# Patient Record
Sex: Male | Born: 1965
Health system: Southern US, Community
[De-identification: ages and names within clinical notes are randomized; demographics above are authoritative.]

## PROBLEM LIST (undated history)

## (undated) DIAGNOSIS — R634 Abnormal weight loss: Secondary | ICD-10-CM

## (undated) DIAGNOSIS — R7989 Other specified abnormal findings of blood chemistry: Secondary | ICD-10-CM

## (undated) DIAGNOSIS — E7800 Pure hypercholesterolemia, unspecified: Secondary | ICD-10-CM

## (undated) HISTORY — DX: Pure hypercholesterolemia, unspecified: E78.00

## (undated) HISTORY — PX: NO PAST SURGERIES: SHX2092

## (undated) HISTORY — DX: Other specified abnormal findings of blood chemistry: R79.89

---

## 2004-01-17 ENCOUNTER — Emergency Department (HOSPITAL_COMMUNITY): Admission: EM | Admit: 2004-01-17 | Discharge: 2004-01-17 | Payer: Self-pay | Admitting: Emergency Medicine

## 2006-08-13 ENCOUNTER — Ambulatory Visit: Payer: Self-pay | Admitting: Family Medicine

## 2006-08-13 DIAGNOSIS — J309 Allergic rhinitis, unspecified: Secondary | ICD-10-CM | POA: Insufficient documentation

## 2006-08-13 DIAGNOSIS — F17200 Nicotine dependence, unspecified, uncomplicated: Secondary | ICD-10-CM | POA: Insufficient documentation

## 2006-09-13 ENCOUNTER — Encounter (INDEPENDENT_AMBULATORY_CARE_PROVIDER_SITE_OTHER): Payer: Self-pay | Admitting: Family Medicine

## 2009-04-18 ENCOUNTER — Encounter (INDEPENDENT_AMBULATORY_CARE_PROVIDER_SITE_OTHER): Payer: Self-pay | Admitting: *Deleted

## 2009-05-13 ENCOUNTER — Ambulatory Visit: Payer: Self-pay | Admitting: Family Medicine

## 2009-05-13 LAB — CONVERTED CEMR LAB: OCCULT 1: NEGATIVE

## 2010-03-31 ENCOUNTER — Ambulatory Visit: Admit: 2010-03-31 | Payer: Self-pay | Admitting: Family Medicine

## 2010-04-08 NOTE — Assessment & Plan Note (Signed)
Summary: NEW PATIENT- room 3   Vital Signs:  Patient profile:   45 year old male Height:      76 inches Weight:      184.25 pounds BMI:     22.51 O2 Sat:      97 % on Room air Pulse rate:   91 / minute Resp:     16 per minute BP sitting:   120 / 80  (left arm)  Vitals Entered By: Adella Hare LPN (May 13, 1608 3:53 PM) CC: new patient Is Patient Diabetic? No Pain Assessment Patient in pain? no        Primary Provider:  Esperanza Sheets PA  CC:  new patient.  History of Present Illness: New pt here to establish care & requests a physical.    He has no complaints or concerns.  He has seasonal allergies which are controlled with otc zyrtec as needed.  He is a smoker.  Is not interested in quitting.  He is aware of the health concerns related to smoking. He drinks 1 beer daily.  His last tetnus vaccine was over 10 yrs ago.  But pt declines one today.   Current Medications (verified): 1)  None  Allergies (verified): No Known Drug Allergies  Past History:  Past medical, surgical, family and social histories (including risk factors) reviewed, and no changes noted (except as noted below).  Past Medical History: Reviewed history from 08/13/2006 and no changes required. Allergic rhinitis  Past Surgical History: Reviewed history from 08/13/2006 and no changes required. Denies surgical history  Family History: Reviewed history from 08/13/2006 and no changes required. Father: hx unknown Mother: rheumatoid arthritis 60s 1 sister: healthy 30 1 child: healthy 47  Social History: Reviewed history from 08/13/2006 and no changes required. Married Alcohol use-yes Current Smoker Occupation: Merchandiser, retail at [employer]  Review of [employer]:  Denies chills and fever. ENT:  Denies earache, nasal congestion, and sore throat. CV:  Denies chest pain or discomfort and palpitations. Resp:  Denies cough and shortness of breath. GI:  Denies abdominal pain,  bloody stools, change in bowel habits, dark tarry stools, indigestion, nausea, and vomiting. GU:  Denies decreased libido, dysuria, erectile dysfunction, nocturia, and urinary hesitancy. MS:  Denies joint pain, joint swelling, low back pain, mid back pain, and muscle weakness. Derm:  Denies changes in color of skin, lesion(s), and rash. Heme:  Denies enlarge lymph nodes. Allergy:  Complains of seasonal allergies.  Physical Exam  General:  Well-developed,well-nourished,in no acute distress; alert,appropriate and cooperative throughout examination Head:  Normocephalic and atraumatic without obvious abnormalities. No apparent alopecia or balding. Eyes:  pupils equal, pupils round, pupils reactive to light, and no injection.   Ears:  External ear exam shows no significant lesions or deformities.  Otoscopic examination reveals clear canals, tympanic membranes are intact bilaterally without bulging, retraction, inflammation or discharge. Hearing is grossly normal bilaterally. Nose:  External nasal examination shows no deformity or inflammation. Nasal mucosa are pink and moist without lesions or exudates. Mouth:  Oral mucosa and oropharynx without lesions or exudates.   Neck:  No deformities, masses, or tenderness noted.no thyromegaly and no carotid bruits.   Lungs:  Normal respiratory effort, chest expands symmetrically. Lungs are clear to auscultation, no crackles or wheezes. Heart:  Normal rate and regular rhythm. S1 and S2 normal without gallop, murmur, click, rub or other extra sounds. Abdomen:  Bowel sounds positive,abdomen soft and non-tender without masses, organomegaly or hernias noted. Rectal:  No external abnormalities  noted. Normal sphincter tone. No rectal masses or tenderness. Genitalia:  Testes bilaterally descended without nodularity, tenderness or masses. No scrotal masses or lesions. No penis lesions or urethral discharge. Prostate:  Prostate gland firm and smooth, no enlargement,  nodularity, tenderness, mass, asymmetry or induration. Pulses:  R and L carotid,radial,femoral,dorsalis pedis and posterior tibial pulses are full and equal bilaterally Extremities:  No clubbing, cyanosis, edema, or deformity noted with normal full range of motion of all joints.   Neurologic:  alert & oriented X3, cranial nerves II-XII intact, strength normal in all extremities, sensation intact to light touch, gait normal, and DTRs symmetrical and normal.   Skin:  Intact without suspicious lesions or rashes Cervical Nodes:  No lymphadenopathy noted Psych:  Cognition and judgment appear intact. Alert and cooperative with normal attention span and concentration. No apparent delusions, illusions, hallucinations   Impression & Recommendations:  Problem # 1:  WELL ADULT EXAM (ICD-V70.0)  Pt declined tetnus today. Labs ordered.  Orders: T-Comprehensive Metabolic Panel [id number]) T-PSA [id number]) T-Vitamin D (25-Hydroxy) [id number]) Hemoccult Guaiac-1 spec.(in office) ([id number])  Problem # 2:  TOBACCO ABUSE (ICD-305.1) Assessment: Unchanged  Encouraged smoking cessation.  Problem # 3:  ALLERGIC RHINITIS (ICD-477.9) Assessment: Improved  Pt will continue otc Zyrtec prn.  Other Orders: T-Lipid Profile [id number]) T-CBC No Diff ([id number]) T-TSH ([id number])  Patient Instructions: 1)  Recommend a physical in 1 year. 2)  If you decide to get a tetnus vaccine you may call the office or go to the health dept. 3)  Have blood work done fasting. 4)  Tobacco is very bad for your health and your loved ones! You Should stop smoking!. 5)  Stop Smoking Tips: Choose a Quit date. Cut down before the Quit date. decide what you will do as a substitute when you feel the urge to smoke(gum,toothpick,exercise).  Laboratory Results    Stool - Occult Blood Hemmoccult #1: negative Date: 05/13/2009

## 2010-04-08 NOTE — Letter (Signed)
Summary: med records release form  med records release form   Imported By: Donneta Romberg 08/16/2006 08:48:07  _____________________________________________________________________  External Attachment:    Type:   Image     Comment:   External Document

## 2010-04-08 NOTE — Letter (Signed)
Summary: no show letter  no show letter   Imported By: Pablo Lawrence 09/20/2006 16:02:51  _____________________________________________________________________  External Attachment:    Type:   Image     Comment:   External Document

## 2010-04-08 NOTE — Letter (Signed)
Summary: 1st Missed Appt.  [facility]  [address]   [place], [place] [address]   Phone: [phone]  Fax: [phone]    April 18, 2009  MRN: [id number]  Eric Dennis [address] [place], [place]  [address]  Dear [name],  At [facility], we make every attempt to fit patients into our schedule by reserving several appointment slots for same-day appointments.  However, we cannot always make appointments for patients the same day they are calling.  At the end of the day, we look back at our schedule and find that because of last-minute cancellations and patients not showing up for their scheduled appointments, we have several appointment slots that are left open and could have been used by another person who really needed it.  In the past, you may have been one of the patients who could not get in when you needed to.  But recently, you were one of the patients with an appointment that you didn't show up for or canceled too late for [place] to fill it.  We choose not to charge no-show or last minute cancellation fees to our patients, like many other offices do.  We do not wish to institute that policy and hope we never have to.  However, we kindly request that you assist [place] by providing at least 24 hours' notice if you can't make your appointment.  If no-shows or late cancellations become habitual (i.e. Three or more in a one-year period), we may terminate the physician-patient relationship.    Thank you for your consideration and cooperation.   Altamease Oiler
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
## 2011-04-08 ENCOUNTER — Ambulatory Visit: Payer: Self-pay | Admitting: Family Medicine

## 2011-04-09 ENCOUNTER — Encounter: Payer: Self-pay | Admitting: Family Medicine

## 2013-12-20 ENCOUNTER — Ambulatory Visit (HOSPITAL_COMMUNITY)
Admission: RE | Admit: 2013-12-20 | Discharge: 2013-12-20 | Disposition: A | Discharge status: Home / Self Care | Payer: Managed Care, Other (non HMO) | Source: Ambulatory Visit | Attending: Family Medicine | Admitting: Family Medicine

## 2013-12-20 ENCOUNTER — Other Ambulatory Visit (HOSPITAL_COMMUNITY): Payer: Self-pay | Admitting: Family Medicine

## 2013-12-20 DIAGNOSIS — R634 Abnormal weight loss: Secondary | ICD-10-CM

## 2013-12-20 DIAGNOSIS — Z0000 Encounter for general adult medical examination without abnormal findings: Secondary | ICD-10-CM | POA: Diagnosis not present

## 2013-12-20 DIAGNOSIS — F17200 Nicotine dependence, unspecified, uncomplicated: Secondary | ICD-10-CM

## 2013-12-20 DIAGNOSIS — Z720 Tobacco use: Secondary | ICD-10-CM | POA: Insufficient documentation

## 2014-01-10 ENCOUNTER — Encounter (INDEPENDENT_AMBULATORY_CARE_PROVIDER_SITE_OTHER): Payer: Self-pay | Admitting: *Deleted

## 2014-01-25 ENCOUNTER — Other Ambulatory Visit (INDEPENDENT_AMBULATORY_CARE_PROVIDER_SITE_OTHER): Payer: Self-pay | Admitting: Internal Medicine

## 2014-01-25 ENCOUNTER — Encounter (INDEPENDENT_AMBULATORY_CARE_PROVIDER_SITE_OTHER): Payer: Self-pay | Admitting: Internal Medicine

## 2014-01-25 ENCOUNTER — Telehealth (INDEPENDENT_AMBULATORY_CARE_PROVIDER_SITE_OTHER): Payer: Self-pay | Admitting: *Deleted

## 2014-01-25 ENCOUNTER — Ambulatory Visit (INDEPENDENT_AMBULATORY_CARE_PROVIDER_SITE_OTHER): Payer: Managed Care, Other (non HMO) | Admitting: Internal Medicine

## 2014-01-25 ENCOUNTER — Other Ambulatory Visit (INDEPENDENT_AMBULATORY_CARE_PROVIDER_SITE_OTHER): Payer: Self-pay | Admitting: *Deleted

## 2014-01-25 VITALS — BP 94/64 | HR 64 | Temp 97.4°F | Ht 77.0 in | Wt 176.2 lb

## 2014-01-25 DIAGNOSIS — R748 Abnormal levels of other serum enzymes: Secondary | ICD-10-CM

## 2014-01-25 DIAGNOSIS — R772 Abnormality of alphafetoprotein: Secondary | ICD-10-CM

## 2014-01-25 DIAGNOSIS — R634 Abnormal weight loss: Secondary | ICD-10-CM

## 2014-01-25 NOTE — Telephone Encounter (Signed)
Patient needs movi prep 

## 2014-01-25 NOTE — Patient Instructions (Addendum)
Colonoscopy. The risks and benefits such as perforation, bleeding, and infection were reviewed with the patient and is agreeable.  AFP and Hepatic function.

## 2014-01-25 NOTE — Progress Notes (Addendum)
   Subjective:    Patient ID: Eric Dennis, male    DOB: 1965-07-14, 48 y.o.   MRN: [id number]  HPI Referred to our office by D.r Eloise Harman for colonoscopy/weight loss. He has lost about 10 over the past years which was unintentional. He says he has lost his appetite. He works around [employer]. He denies any pain.  He usually has a BM daily. He has never done IV drugs. He has 3 tattoos.  All tattoos professionally done. He tells his Hepatitis panel was negative. I will get those records.  12/21/2013 H and H 15.5 and 43.6, MCV 98.2, Platelet ct 204. ALP 117 AST 169, ALT 123, bili 0.3 Hep B Surface Antigen negative, Hep C antibody negative, Hepatitis B Core Ab, IgM non-reactive Hep A antibody,IgM nonreactive  Review of Systems Divorced. One child in good health. Works at [employer].    No past medical history on file.  No past surgical history on file.  No Known Allergies  No current outpatient prescriptions on file prior to visit.   No current facility-administered medications on file prior to visit.     Objective:   Physical Exam Filed Vitals:   01/25/14 1549  Height: 6\' 5"  (1.956 m)  Weight: 176 lb 3.2 oz (79.924 kg)   Alert and oriented. Skin warm and dry. Oral mucosa is moist.   . Sclera anicteric, conjunctivae is pink. Thyroid not enlarged. No cervical lymphadenopathy. Lungs clear. Heart regular rate and rhythm.  Abdomen is soft. Bowel sounds are positive. No hepatomegaly. No abdominal masses felt. No tenderness.  No edema to lower extremities.       Assessment & Plan:  Weight loss. Patient due for screening colonoscopy. Will rule out colonic neoplasm, polyp, AVM, Elevated liver enzymes.  AFP, Hepatic panel. Further recommendations to follow concerning his elevated liver enzymes.

## 2014-01-26 ENCOUNTER — Telehealth (INDEPENDENT_AMBULATORY_CARE_PROVIDER_SITE_OTHER): Payer: Self-pay | Admitting: Internal Medicine

## 2014-01-26 DIAGNOSIS — R772 Abnormality of alphafetoprotein: Secondary | ICD-10-CM

## 2014-01-26 DIAGNOSIS — Z1211 Encounter for screening for malignant neoplasm of colon: Secondary | ICD-10-CM

## 2014-01-26 DIAGNOSIS — R748 Abnormal levels of other serum enzymes: Secondary | ICD-10-CM

## 2014-01-26 LAB — HEPATIC FUNCTION PANEL
ALT: 97 U/L — ABNORMAL HIGH (ref 0–53)
AST: 133 U/L — ABNORMAL HIGH (ref 0–37)
Albumin: 3.7 g/dL (ref 3.5–5.2)
Alkaline Phosphatase: 94 U/L (ref 39–117)
Bilirubin, Direct: 0.1 mg/dL (ref 0.0–0.3)
Indirect Bilirubin: 0.5 mg/dL (ref 0.2–1.2)
Total Bilirubin: 0.6 mg/dL (ref 0.2–1.2)
Total Protein: 6.6 g/dL (ref 6.0–8.3)

## 2014-01-26 LAB — AFP TUMOR MARKER: AFP-Tumor Marker: 8 ng/mL — ABNORMAL HIGH (ref <6.1)

## 2014-01-26 MED ORDER — PEG-KCL-NACL-NASULF-NA ASC-C 100 G PO SOLR: 1.0000 | Freq: Once | ORAL | Status: DC

## 2014-01-26 NOTE — Addendum Note (Signed)
Addended by: Butch Penny on: 01/26/2014 09:48 AM   Modules accepted: Orders

## 2014-01-26 NOTE — Telephone Encounter (Signed)
Results given to patient. Will get a CT  Abdomen/pelivs with CM.

## 2014-01-26 NOTE — Telephone Encounter (Signed)
Ct ordered

## 2014-01-26 NOTE — Telephone Encounter (Signed)
Moviprep ordered.

## 2014-01-26 NOTE — Telephone Encounter (Signed)
error 

## 2014-01-29 NOTE — Telephone Encounter (Signed)
Authorization in process

## 2014-01-31 NOTE — Telephone Encounter (Signed)
Auth# [id number] @ [id number] , patient aware

## 2014-02-06 ENCOUNTER — Ambulatory Visit (HOSPITAL_COMMUNITY)
Admission: RE | Admit: 2014-02-06 | Discharge: 2014-02-06 | Disposition: A | Discharge status: Home / Self Care | Payer: Managed Care, Other (non HMO) | Source: Ambulatory Visit | Attending: Internal Medicine | Admitting: Internal Medicine

## 2014-02-06 DIAGNOSIS — R772 Abnormality of alphafetoprotein: Secondary | ICD-10-CM

## 2014-02-06 DIAGNOSIS — R945 Abnormal results of liver function studies: Secondary | ICD-10-CM | POA: Diagnosis present

## 2014-02-06 DIAGNOSIS — K429 Umbilical hernia without obstruction or gangrene: Secondary | ICD-10-CM | POA: Insufficient documentation

## 2014-02-06 DIAGNOSIS — K760 Fatty (change of) liver, not elsewhere classified: Secondary | ICD-10-CM | POA: Insufficient documentation

## 2014-02-06 DIAGNOSIS — I700 Atherosclerosis of aorta: Secondary | ICD-10-CM | POA: Insufficient documentation

## 2014-02-06 DIAGNOSIS — R748 Abnormal levels of other serum enzymes: Secondary | ICD-10-CM

## 2014-02-06 MED ORDER — IOHEXOL 300 MG/ML  SOLN
100.0000 mL | Freq: Once | INTRAMUSCULAR | Status: AC | PRN
  Administered 2014-02-06: 100 mL via INTRAVENOUS

## 2014-02-08 ENCOUNTER — Telehealth (INDEPENDENT_AMBULATORY_CARE_PROVIDER_SITE_OTHER): Payer: Self-pay | Admitting: Internal Medicine

## 2014-02-08 ENCOUNTER — Telehealth (INDEPENDENT_AMBULATORY_CARE_PROVIDER_SITE_OTHER): Payer: Self-pay | Admitting: *Deleted

## 2014-02-08 DIAGNOSIS — R748 Abnormal levels of other serum enzymes: Secondary | ICD-10-CM

## 2014-02-08 NOTE — Telephone Encounter (Signed)
labs

## 2014-02-08 NOTE — Telephone Encounter (Signed)
.  Per Terri Setzer,NP patient to have labs. 

## 2014-02-08 NOTE — Telephone Encounter (Signed)
Labs

## 2014-02-12 ENCOUNTER — Telehealth (INDEPENDENT_AMBULATORY_CARE_PROVIDER_SITE_OTHER): Payer: Self-pay | Admitting: *Deleted

## 2014-02-12 ENCOUNTER — Encounter (INDEPENDENT_AMBULATORY_CARE_PROVIDER_SITE_OTHER): Payer: Self-pay | Admitting: *Deleted

## 2014-02-12 NOTE — Telephone Encounter (Signed)
FYI: Patient rescheduled TCS to 03/14/14

## 2014-02-22 ENCOUNTER — Encounter (INDEPENDENT_AMBULATORY_CARE_PROVIDER_SITE_OTHER): Payer: Self-pay

## 2014-03-13 ENCOUNTER — Telehealth (INDEPENDENT_AMBULATORY_CARE_PROVIDER_SITE_OTHER): Payer: Self-pay | Admitting: *Deleted

## 2014-03-13 DIAGNOSIS — Z1211 Encounter for screening for malignant neoplasm of colon: Secondary | ICD-10-CM

## 2014-03-13 MED ORDER — PEG-KCL-NACL-NASULF-NA ASC-C 100 G PO SOLR: 1.0000 | Freq: Once | ORAL | Status: DC

## 2014-03-13 NOTE — Telephone Encounter (Signed)
Patient states pharmacy didn't get, please resend

## 2014-03-14 ENCOUNTER — Ambulatory Visit (HOSPITAL_COMMUNITY)
Admission: RE | Admit: 2014-03-14 | Discharge: 2014-03-14 | Disposition: A | Discharge status: Home / Self Care | Payer: Managed Care, Other (non HMO) | Source: Ambulatory Visit | Attending: Internal Medicine | Admitting: Internal Medicine

## 2014-03-14 ENCOUNTER — Encounter (HOSPITAL_COMMUNITY)
Admission: RE | Disposition: A | Discharge status: Home / Self Care | Payer: Self-pay | Source: Ambulatory Visit | Attending: Internal Medicine

## 2014-03-14 ENCOUNTER — Encounter (HOSPITAL_COMMUNITY): Payer: Self-pay | Admitting: *Deleted

## 2014-03-14 DIAGNOSIS — K644 Residual hemorrhoidal skin tags: Secondary | ICD-10-CM | POA: Diagnosis not present

## 2014-03-14 DIAGNOSIS — F17210 Nicotine dependence, cigarettes, uncomplicated: Secondary | ICD-10-CM | POA: Diagnosis not present

## 2014-03-14 DIAGNOSIS — R634 Abnormal weight loss: Secondary | ICD-10-CM

## 2014-03-14 DIAGNOSIS — K649 Unspecified hemorrhoids: Secondary | ICD-10-CM

## 2014-03-14 DIAGNOSIS — K648 Other hemorrhoids: Secondary | ICD-10-CM | POA: Diagnosis not present

## 2014-03-14 DIAGNOSIS — Z1211 Encounter for screening for malignant neoplasm of colon: Secondary | ICD-10-CM

## 2014-03-14 HISTORY — PX: COLONOSCOPY: SHX5424

## 2014-03-14 HISTORY — DX: Abnormal weight loss: R63.4

## 2014-03-14 LAB — HEPATIC FUNCTION PANEL
ALT: 128 U/L — ABNORMAL HIGH (ref 0–53)
AST: 163 U/L — ABNORMAL HIGH (ref 0–37)
Albumin: 3.5 g/dL (ref 3.5–5.2)
Alkaline Phosphatase: 108 U/L (ref 39–117)
Bilirubin, Direct: 0.2 mg/dL (ref 0.0–0.3)
Indirect Bilirubin: 0.9 mg/dL (ref 0.3–0.9)
Total Bilirubin: 1.1 mg/dL (ref 0.3–1.2)
Total Protein: 6.8 g/dL (ref 6.0–8.3)

## 2014-03-14 LAB — FERRITIN: Ferritin: 975 ng/mL — ABNORMAL HIGH (ref 22–322)

## 2014-03-14 SURGERY — COLONOSCOPY
Anesthesia: Moderate Sedation

## 2014-03-14 MED ORDER — MIDAZOLAM HCL 5 MG/5ML IJ SOLN
INTRAMUSCULAR | Status: AC
  Filled 2014-03-14: qty 10

## 2014-03-14 MED ORDER — MEPERIDINE HCL 50 MG/ML IJ SOLN
INTRAMUSCULAR | Status: DC | PRN
  Administered 2014-03-14 (×2): 25 mg via INTRAVENOUS

## 2014-03-14 MED ORDER — MIDAZOLAM HCL 5 MG/5ML IJ SOLN
INTRAMUSCULAR | Status: DC | PRN
  Administered 2014-03-14 (×3): 2 mg via INTRAVENOUS

## 2014-03-14 MED ORDER — SODIUM CHLORIDE 0.9 % IV SOLN
INTRAVENOUS | Status: DC
  Administered 2014-03-14: 11:00:00 via INTRAVENOUS

## 2014-03-14 MED ORDER — STERILE WATER FOR IRRIGATION IR SOLN
Status: DC | PRN
  Administered 2014-03-14: 11:00:00

## 2014-03-14 MED ORDER — MEPERIDINE HCL 50 MG/ML IJ SOLN
INTRAMUSCULAR | Status: AC
  Filled 2014-03-14: qty 1

## 2014-03-14 NOTE — H&P (Addendum)
Eric Dennis is an 49 y.o. male.   Chief Complaint:  Patient is here for colonoscopy. HPI:  Patient is 49 year old African-American male was here for screening colonoscopy. He was recently evaluated for 10 pound weight loss and noted to have mildly elevated transaminases. Markers for hepatitis A, B, and C are negative.  Abdominopelvic CT suggested fatty liver but no other abnormalities. He denies nausea vomiting abdominal pain melena or rectal bleeding or diarrhea. He states his appetite is so-so. He does not take any medications on regular basis. He drinks 2 cans of beer daily. He smokes vital pack of cigarettes daily.  Family history is negative for liver disease or CRC.  Past Medical History  Diagnosis Date  . Weight loss     Past Surgical History  Procedure Laterality Date  . No past surgeries      History reviewed. No pertinent family history. Social History:  reports that he has been smoking Cigarettes.  He has a 5 pack-year smoking history. He does not have any smokeless tobacco history on file. He reports that he drinks about 8.4 oz of alcohol per week. He reports that he does not use illicit drugs.  Allergies: No Known Allergies  Medications Prior to Admission  Medication Sig Dispense Refill  . peg 3350 powder (MOVIPREP) 100 G SOLR Take 1 kit (200 g total) by mouth once. 1 kit 0    No results found for this or any previous visit (from the past 48 hour(s)). No results found.  ROS  Blood pressure 145/107, pulse 88, temperature 97.8 F (36.6 C), temperature source Oral, resp. rate 20, height '6\' 5"'  (1.956 m), weight 176 lb (79.833 kg), SpO2 100 %. Physical Exam  Constitutional: He appears well-developed and well-nourished.  HENT:  Mouth/Throat: Oropharynx is clear and moist.  Eyes: Conjunctivae are normal. No scleral icterus.  Neck: No thyromegaly present.  Cardiovascular: Normal rate, regular rhythm and normal heart sounds.   No murmur heard. Respiratory: Effort  normal and breath sounds normal.  GI: Soft. He exhibits no distension and no mass. There is no tenderness.  Musculoskeletal: He exhibits no edema.  Lymphadenopathy:    He has no cervical adenopathy.  Neurological: He is alert.  Skin: Skin is warm and dry.     Assessment/Plan  Average risk screening colonoscopy.  Elevated transaminases; being worked up.  Unexplained weight loss.  REHMAN,NAJEEB U 03/14/2014, 10:56 AM

## 2014-03-14 NOTE — Op Note (Signed)
COLONOSCOPY PROCEDURE REPORT  PATIENT:  Eric Dennis  MR#:  [id number] Birthdate:  10-14-1965, 49 y.o., male Endoscopist:  Dr. Rogene Houston, MD Referred By:  Dr.  Rocky Morel, MD Procedure Date: 03/14/2014  Procedure:   Colonoscopy  Indications:   Patient is 49 year old African-American male who is undergoing colonoscopy primarily for screening purposes. He was recently evaluated for weight loss and noted to have elevated transaminases and workup is underway.  Informed Consent:  The procedure and risks were reviewed with the patient and informed consent was obtained.  Medications:  Demerol 50 mg IV Versed 6 mg IV  Description of procedure:  After a digital rectal exam was performed, that colonoscope was advanced from the anus through the rectum and colon to the area of the cecum, ileocecal valve and appendiceal orifice. The cecum was deeply intubated. These structures were well-seen and photographed for the record. From the level of the cecum and ileocecal valve, the scope was slowly and cautiously withdrawn. The mucosal surfaces were carefully surveyed utilizing scope tip to flexion to facilitate fold flattening as needed. The scope was pulled down into the rectum where a thorough exam including retroflexion was performed.  Findings:   Prep satisfactory.  normal mucosa of cecum, ascending colon , hepatic flexure, transverse colon, splenic flexure , descending and sigmoid colon.  Normal rectal mucosa.  Hemorrhoids noted above and below the dentate line.   Therapeutic/Diagnostic Maneuvers Performed:  None  Complications:   none  Cecal Withdrawal Time:   8 minutes  Impression:   Examination performed to cecum.  Internal and external hemorrhoids otherwise normal colonoscopy.   Recommendations:  Standard instructions given.  Patient advised to quit drinking alcohol.  Will check LFTs and serum ferritin today.  [name]  03/14/2014 11:36 AM  CC: Dr.  Ethlyn Gallery, Kassie Mends, MD & Dr. Rayne Du ref. provider found

## 2014-03-14 NOTE — Discharge Instructions (Signed)
Resume usual diet.  You need to stop drinking alcohol while we are trying to figure out cause of elevated liver enzymes.  No driving for 24 hours.  Physician will call with results of blood tests.  Colonoscopy, Care After Refer to this sheet in the next few weeks. These instructions provide you with information on caring for yourself after your procedure. Your health care provider may also give you more specific instructions. Your treatment has been planned according to current medical practices, but problems sometimes occur. Call your health care provider if you have any problems or questions after your procedure. WHAT TO EXPECT AFTER THE PROCEDURE  After your procedure, it is typical to have the following:  A small amount of blood in your stool.  Moderate amounts of gas and mild abdominal cramping or bloating. HOME CARE INSTRUCTIONS  Do not drive, operate machinery, or sign important documents for 24 hours.  You may shower and resume your regular physical activities, but move at a slower pace for the first 24 hours.  Take frequent rest periods for the first 24 hours.  Walk around or put a warm pack on your abdomen to help reduce abdominal cramping and bloating.  Drink enough fluids to keep your urine clear or pale yellow.  You may resume your normal diet as instructed by your health care provider. Avoid heavy or fried foods that are hard to digest.  Avoid drinking alcohol for 24 hours or as instructed by your health care provider.  Only take over-the-counter or prescription medicines as directed by your health care provider.  If a tissue sample (biopsy) was taken during your procedure:  Do not take aspirin or blood thinners for 7 days, or as instructed by your health care provider.  Do not drink alcohol for 7 days, or as instructed by your health care provider.  Eat soft foods for the first 24 hours. SEEK MEDICAL CARE IF: You have persistent spotting of blood in your stool  2-3 days after the procedure. SEEK IMMEDIATE MEDICAL CARE IF:  You have more than a small spotting of blood in your stool.  You pass large blood clots in your stool.  Your abdomen is swollen (distended).  You have nausea or vomiting.  You have a fever.  You have increasing abdominal pain that is not relieved with medicine. Document Released: 10/08/2003 Document Revised: 12/14/2012 Document Reviewed: 10/31/2012 [facility] Patient Information 2015 [place], [place]. This information is not intended to replace advice given to you by your health care provider. Make sure you discuss any questions you have with your health care provider.

## 2014-03-15 ENCOUNTER — Encounter (HOSPITAL_COMMUNITY): Payer: Self-pay | Admitting: Internal Medicine

## 2014-03-21 ENCOUNTER — Encounter (INDEPENDENT_AMBULATORY_CARE_PROVIDER_SITE_OTHER): Payer: Self-pay | Admitting: *Deleted

## 2014-03-21 ENCOUNTER — Telehealth (INDEPENDENT_AMBULATORY_CARE_PROVIDER_SITE_OTHER): Payer: Self-pay | Admitting: *Deleted

## 2014-03-21 DIAGNOSIS — R634 Abnormal weight loss: Secondary | ICD-10-CM

## 2014-03-21 NOTE — Telephone Encounter (Signed)
Per Dr.Rehman the patient will need to have labs drawn on 03/30/14.

## 2014-05-08 ENCOUNTER — Telehealth (INDEPENDENT_AMBULATORY_CARE_PROVIDER_SITE_OTHER): Payer: Self-pay | Admitting: Internal Medicine

## 2014-05-08 NOTE — Telephone Encounter (Signed)
error 

## 2014-05-11 NOTE — Telephone Encounter (Signed)
error 

## 2015-02-27 ENCOUNTER — Emergency Department (HOSPITAL_COMMUNITY)
Admission: EM | Admit: 2015-02-27 | Discharge: 2015-02-27 | Disposition: A | Discharge status: Home / Self Care | Payer: Managed Care, Other (non HMO) | Attending: Emergency Medicine | Admitting: Emergency Medicine

## 2015-02-27 ENCOUNTER — Encounter (HOSPITAL_COMMUNITY): Payer: Self-pay | Admitting: Emergency Medicine

## 2015-02-27 DIAGNOSIS — L02212 Cutaneous abscess of back [any part, except buttock]: Secondary | ICD-10-CM | POA: Insufficient documentation

## 2015-02-27 DIAGNOSIS — L723 Sebaceous cyst: Secondary | ICD-10-CM | POA: Insufficient documentation

## 2015-02-27 DIAGNOSIS — L089 Local infection of the skin and subcutaneous tissue, unspecified: Secondary | ICD-10-CM

## 2015-02-27 DIAGNOSIS — F17210 Nicotine dependence, cigarettes, uncomplicated: Secondary | ICD-10-CM | POA: Insufficient documentation

## 2015-02-27 MED ORDER — POVIDONE-IODINE 10 % EX SOLN
CUTANEOUS | Status: AC
  Filled 2015-02-27: qty 118

## 2015-02-27 MED ORDER — LIDOCAINE HCL (PF) 1 % IJ SOLN
5.0000 mL | Freq: Once | INTRAMUSCULAR | Status: AC
  Administered 2015-02-27: 5 mL
  Filled 2015-02-27: qty 5

## 2015-02-27 NOTE — Discharge Instructions (Signed)

## 2015-02-27 NOTE — ED Provider Notes (Signed)
CSN: [id number]     Arrival date & time 02/27/15  0040 History   First MD Initiated Contact with Patient 02/27/15 0114     Chief Complaint  Patient presents with  . Abscess     (Consider location/radiation/quality/duration/timing/severity/associated sxs/prior Treatment) Patient is a 49 y.o. male presenting with abscess. The history is provided by the patient.  Abscess He has noted an abscess between his shoulder blades over the last 3 weeks. It has been growing. He has tried applying topical cold these without relief. He states that it is sore but not actually painful. He denies fever, chills, sweats.  Past Medical History  Diagnosis Date  . Weight loss    Past Surgical History  Procedure Laterality Date  . No past surgeries    . Colonoscopy N/A 03/14/2014    Procedure: COLONOSCOPY;  Surgeon: Rogene Houston, MD;  Location: [facility];  Service: Endoscopy;  Laterality: N/A;  730 - moved to 1/6 @ 12:00 - Ann notified pt   History reviewed. No pertinent family history. Social History  Substance Use Topics  . Smoking status: Current Every Day Smoker -- 0.25 packs/day for 20 years    Types: Cigarettes  . Smokeless tobacco: None     Comment: 1 pack every 4 days x 20 yrs  . Alcohol Use: 8.4 oz/week    0 Standard drinks or equivalent, 14 Cans of beer per week     Comment: 1-2 beers a day.    Review of Systems  All other systems reviewed and are negative.     Allergies  Review of patient's allergies indicates no known allergies.  Home Medications   Prior to Admission medications   Not on File   BP 134/85 mmHg  Pulse 82  Temp(Src) 97.5 F (36.4 C) (Oral)  Resp 18  Ht 6\' 5"  (1.956 m)  Wt 185 lb (83.915 kg)  BMI 21.93 kg/m2  SpO2 96% Physical Exam  Nursing note and vitals reviewed.  49 year old male, resting comfortably and in no acute distress. Vital signs are normal. Oxygen saturation is 96%, which is normal. Head is normocephalic and atraumatic. PERRLA, EOMI.  Oropharynx is clear. Neck is nontender and supple without adenopathy or JVD. 3.5 x 3.5 cm abscess present in the interscapular area. Back is nontender and there is no CVA tenderness. Lungs are clear without rales, wheezes, or rhonchi. Chest is nontender. Heart has regular rate and rhythm without murmur. Abdomen is soft, flat, nontender without masses or hepatosplenomegaly and peristalsis is normoactive. Extremities have no cyanosis or edema, full range of motion is present. Skin is warm and dry without rash. Neurologic: Mental status is normal, cranial nerves are intact, there are no motor or sensory deficits.  ED Course  Procedures (including critical care time) INCISION AND DRAINAGE Performed by: [id number] Consent: Verbal consent obtained. Risks and benefits: risks, benefits and alternatives were discussed Type: abscess  Body area: upper back  Anesthesia: local infiltration  Incision was made with a scalpel.  Local anesthetic: lidocaine 1% without epinephrine  Anesthetic total: 2 ml  Complexity: complex Blunt dissection to break up loculations  Drainage: purulent, sebaceous material  Drainage amount: large  Patient tolerance: Patient tolerated the procedure well with no immediate complications.     MDM   Final diagnoses:  Abscess of back  Infected sebaceous cyst    Abscess treated with incision and drainage. Old records reviewed and he had colonoscopy in January was noted to have elevated liver functions. He states  she has not followed up regarding those tests and he continues to consume alcohol. He is advised to abstain from alcohol and follow-up with his PCP regarding his abnormal liver tests.    Delora Fuel, MD [id number] [phone]

## 2015-02-27 NOTE — ED Notes (Signed)
Pt c/o abscess in between shoulder blades for about 3 weeks.

## 2018-04-14 DIAGNOSIS — Z008 Encounter for other general examination: Secondary | ICD-10-CM | POA: Diagnosis not present

## 2018-04-14 DIAGNOSIS — F17210 Nicotine dependence, cigarettes, uncomplicated: Secondary | ICD-10-CM | POA: Diagnosis not present

## 2018-04-14 DIAGNOSIS — Z716 Tobacco abuse counseling: Secondary | ICD-10-CM | POA: Diagnosis not present

## 2018-04-14 DIAGNOSIS — Z719 Counseling, unspecified: Secondary | ICD-10-CM | POA: Diagnosis not present

## 2018-04-28 DIAGNOSIS — Z716 Tobacco abuse counseling: Secondary | ICD-10-CM | POA: Diagnosis not present

## 2018-04-28 DIAGNOSIS — F17210 Nicotine dependence, cigarettes, uncomplicated: Secondary | ICD-10-CM | POA: Diagnosis not present

## 2018-05-12 DIAGNOSIS — Z716 Tobacco abuse counseling: Secondary | ICD-10-CM | POA: Diagnosis not present

## 2018-05-12 DIAGNOSIS — F17210 Nicotine dependence, cigarettes, uncomplicated: Secondary | ICD-10-CM | POA: Diagnosis not present

## 2018-07-27 DIAGNOSIS — R634 Abnormal weight loss: Secondary | ICD-10-CM | POA: Diagnosis not present

## 2018-07-27 DIAGNOSIS — F17210 Nicotine dependence, cigarettes, uncomplicated: Secondary | ICD-10-CM | POA: Diagnosis not present

## 2018-07-27 DIAGNOSIS — R590 Localized enlarged lymph nodes: Secondary | ICD-10-CM | POA: Diagnosis not present

## 2018-07-27 DIAGNOSIS — R630 Anorexia: Secondary | ICD-10-CM | POA: Diagnosis not present

## 2018-09-29 DIAGNOSIS — Z716 Tobacco abuse counseling: Secondary | ICD-10-CM | POA: Diagnosis not present

## 2018-09-29 DIAGNOSIS — F17210 Nicotine dependence, cigarettes, uncomplicated: Secondary | ICD-10-CM | POA: Diagnosis not present

## 2018-10-04 ENCOUNTER — Other Ambulatory Visit: Payer: Self-pay

## 2018-10-04 DIAGNOSIS — Z20822 Contact with and (suspected) exposure to covid-19: Secondary | ICD-10-CM

## 2018-10-04 DIAGNOSIS — R6889 Other general symptoms and signs: Secondary | ICD-10-CM | POA: Diagnosis not present

## 2018-10-06 LAB — NOVEL CORONAVIRUS, NAA: SARS-CoV-2, NAA: NOT DETECTED

## 2018-10-07 ENCOUNTER — Observation Stay (HOSPITAL_COMMUNITY)
Admission: EM | Admit: 2018-10-07 | Discharge: 2018-10-09 | Disposition: A | Discharge status: Home / Self Care | Payer: BC Managed Care – PPO | Attending: Internal Medicine | Admitting: Internal Medicine

## 2018-10-07 ENCOUNTER — Encounter (HOSPITAL_COMMUNITY): Payer: Self-pay | Admitting: Emergency Medicine

## 2018-10-07 ENCOUNTER — Emergency Department (HOSPITAL_COMMUNITY): Payer: BC Managed Care – PPO

## 2018-10-07 ENCOUNTER — Other Ambulatory Visit: Payer: Self-pay

## 2018-10-07 DIAGNOSIS — F17210 Nicotine dependence, cigarettes, uncomplicated: Secondary | ICD-10-CM | POA: Diagnosis not present

## 2018-10-07 DIAGNOSIS — I950 Idiopathic hypotension: Secondary | ICD-10-CM | POA: Diagnosis not present

## 2018-10-07 DIAGNOSIS — Z03818 Encounter for observation for suspected exposure to other biological agents ruled out: Secondary | ICD-10-CM | POA: Diagnosis not present

## 2018-10-07 DIAGNOSIS — R55 Syncope and collapse: Secondary | ICD-10-CM | POA: Diagnosis not present

## 2018-10-07 DIAGNOSIS — I951 Orthostatic hypotension: Secondary | ICD-10-CM | POA: Diagnosis not present

## 2018-10-07 DIAGNOSIS — R740 Nonspecific elevation of levels of transaminase and lactic acid dehydrogenase [LDH]: Secondary | ICD-10-CM | POA: Insufficient documentation

## 2018-10-07 DIAGNOSIS — D6959 Other secondary thrombocytopenia: Secondary | ICD-10-CM | POA: Diagnosis not present

## 2018-10-07 DIAGNOSIS — Z20828 Contact with and (suspected) exposure to other viral communicable diseases: Secondary | ICD-10-CM | POA: Diagnosis not present

## 2018-10-07 DIAGNOSIS — E538 Deficiency of other specified B group vitamins: Secondary | ICD-10-CM | POA: Diagnosis not present

## 2018-10-07 DIAGNOSIS — F1020 Alcohol dependence, uncomplicated: Secondary | ICD-10-CM | POA: Diagnosis not present

## 2018-10-07 DIAGNOSIS — D696 Thrombocytopenia, unspecified: Secondary | ICD-10-CM

## 2018-10-07 DIAGNOSIS — F17200 Nicotine dependence, unspecified, uncomplicated: Secondary | ICD-10-CM | POA: Diagnosis present

## 2018-10-07 DIAGNOSIS — R7401 Elevation of levels of liver transaminase levels: Secondary | ICD-10-CM

## 2018-10-07 DIAGNOSIS — R748 Abnormal levels of other serum enzymes: Secondary | ICD-10-CM | POA: Diagnosis present

## 2018-10-07 LAB — URINALYSIS, ROUTINE W REFLEX MICROSCOPIC
Glucose, UA: NEGATIVE mg/dL
Hgb urine dipstick: NEGATIVE
Ketones, ur: NEGATIVE mg/dL
Leukocytes,Ua: NEGATIVE
Nitrite: NEGATIVE
Protein, ur: NEGATIVE mg/dL
Specific Gravity, Urine: 1.025 (ref 1.005–1.030)
pH: 6 (ref 5.0–8.0)

## 2018-10-07 LAB — CBC
HCT: 38.6 % — ABNORMAL LOW (ref 39.0–52.0)
Hemoglobin: 13.6 g/dL (ref 13.0–17.0)
MCH: 35 pg — ABNORMAL HIGH (ref 26.0–34.0)
MCHC: 35.2 g/dL (ref 30.0–36.0)
MCV: 99.2 fL (ref 80.0–100.0)
Platelets: 116 10*3/uL — ABNORMAL LOW (ref 150–400)
RBC: 3.89 MIL/uL — ABNORMAL LOW (ref 4.22–5.81)
RDW: 14.9 % (ref 11.5–15.5)
WBC: 4.5 10*3/uL (ref 4.0–10.5)
nRBC: 0 % (ref 0.0–0.2)

## 2018-10-07 LAB — BASIC METABOLIC PANEL
Anion gap: 10 (ref 5–15)
BUN: 10 mg/dL (ref 6–20)
CO2: 27 mmol/L (ref 22–32)
Calcium: 8.5 mg/dL — ABNORMAL LOW (ref 8.9–10.3)
Chloride: 99 mmol/L (ref 98–111)
Creatinine, Ser: 0.73 mg/dL (ref 0.61–1.24)
GFR calc Af Amer: >60 mL/min (ref >60)
GFR calc non Af Amer: >60 mL/min (ref >60)
Glucose, Bld: 119 mg/dL — ABNORMAL HIGH (ref 70–99)
Potassium: 3.8 mmol/L (ref 3.5–5.1)
Sodium: 136 mmol/L (ref 135–145)

## 2018-10-07 LAB — CBG MONITORING, ED: Glucose-Capillary: 132 mg/dL — ABNORMAL HIGH (ref 70–99)

## 2018-10-07 LAB — MAGNESIUM: Magnesium: 1.6 mg/dL — ABNORMAL LOW (ref 1.7–2.4)

## 2018-10-07 LAB — SARS CORONAVIRUS 2 BY RT PCR (HOSPITAL ORDER, PERFORMED IN ~~LOC~~ HOSPITAL LAB): SARS Coronavirus 2: NEGATIVE

## 2018-10-07 LAB — TROPONIN I (HIGH SENSITIVITY)
Troponin I (High Sensitivity): 3 ng/L (ref <18)
Troponin I (High Sensitivity): 3 ng/L (ref <18)

## 2018-10-07 LAB — LIPASE, BLOOD: Lipase: 42 U/L (ref 11–51)

## 2018-10-07 MED ORDER — LORAZEPAM 1 MG PO TABS: 1.0000 mg | ORAL_TABLET | Freq: Four times a day (QID) | ORAL | Status: DC | PRN

## 2018-10-07 MED ORDER — SODIUM CHLORIDE 0.9 % IV SOLN
1000.0000 mL | INTRAVENOUS | Status: DC
  Administered 2018-10-07: 1000 mL via INTRAVENOUS

## 2018-10-07 MED ORDER — SODIUM CHLORIDE 0.9 % IV BOLUS (SEPSIS)
1000.0000 mL | Freq: Once | INTRAVENOUS | Status: AC
  Administered 2018-10-07: 1000 mL via INTRAVENOUS

## 2018-10-07 MED ORDER — SODIUM CHLORIDE 0.9 % IV SOLN: 1000.0000 mL | INTRAVENOUS | Status: DC

## 2018-10-07 MED ORDER — SODIUM CHLORIDE 0.9% FLUSH: 3.0000 mL | Freq: Once | INTRAVENOUS | Status: DC

## 2018-10-07 MED ORDER — ADULT MULTIVITAMIN W/MINERALS CH
1.0000 | ORAL_TABLET | Freq: Every day | ORAL | Status: DC
  Administered 2018-10-08 – 2018-10-09 (×2): 1 via ORAL
  Filled 2018-10-07 (×2): qty 1

## 2018-10-07 MED ORDER — LORAZEPAM 2 MG/ML IJ SOLN: 1.0000 mg | Freq: Four times a day (QID) | INTRAMUSCULAR | Status: DC | PRN

## 2018-10-07 NOTE — ED Provider Notes (Addendum)
[facility] EMERGENCY DEPARTMENT Provider Note   CSN: [id number] Arrival date & time: 10/07/18  1548     History   Chief Complaint Chief Complaint  Patient presents with  . Loss of Consciousness    HPI Eric Dennis is a 53 y.o. male.     The history is provided by the patient.  Loss of Consciousness Episode history:  Single Most recent episode:  Today Duration:  30 seconds Progression:  Improving Chronicity:  New Context: sitting down   Context comment:  Pt was getting a hair cut when he passed out Witnessed: yes   Relieved by:  Nothing Worsened by:  Nothing Ineffective treatments:  None tried Associated symptoms: nausea   Associated symptoms: no chest pain, no confusion, no diaphoresis, no dizziness, no fever, no palpitations, no recent injury, no seizures, no shortness of breath, no visual change, no vomiting and no weakness   Risk factors: no seizures     Past Medical History:  Diagnosis Date  . Weight loss     Patient Active Problem List   Diagnosis Date Noted  . Elevated liver enzymes 01/25/2014  . Loss of weight 01/25/2014  . TOBACCO ABUSE 08/13/2006  . ALLERGIC RHINITIS 08/13/2006    Past Surgical History:  Procedure Laterality Date  . COLONOSCOPY N/A 03/14/2014   Procedure: COLONOSCOPY;  Surgeon: Rogene Houston, MD;  Location: [facility];  Service: Endoscopy;  Laterality: N/A;  730 - moved to 1/6 @ 12:00 - Ann notified pt  . NO PAST SURGERIES          Home Medications    Prior to Admission medications   Not on File    Family History Family History  Problem Relation Age of Onset  . Diabetes Mother     Social History Social History   Tobacco Use  . Smoking status: Current Every Day Smoker    Packs/day: 0.25    Years: 20.00    Pack years: 5.00    Types: Cigarettes  . Smokeless tobacco: Never Used  . Tobacco comment: 1 pack every 4 days x 20 yrs  Substance Use Topics  . Alcohol use: Yes    Alcohol/week: 14.0 standard  drinks    Types: 14 Cans of beer per week    Comment: 1-2 beers a day.  . Drug use: No     Allergies   Patient has no known allergies.   Review of Systems Review of Systems  Constitutional: Positive for appetite change. Negative for activity change, diaphoresis and fever.  HENT: Negative for congestion, ear discharge, ear pain, facial swelling, nosebleeds, rhinorrhea, sneezing and tinnitus.   Eyes: Negative for photophobia, pain and discharge.  Respiratory: Negative for cough, choking, shortness of breath and wheezing.   Cardiovascular: Positive for syncope. Negative for chest pain, palpitations and leg swelling.  Gastrointestinal: Positive for nausea. Negative for abdominal pain, blood in stool, constipation, diarrhea and vomiting.  Genitourinary: Negative for difficulty urinating, dysuria, flank pain, frequency and hematuria.  Musculoskeletal: Negative for back pain, gait problem, myalgias and neck pain.  Skin: Negative for color change, rash and wound.  Neurological: Positive for syncope. Negative for dizziness, seizures, facial asymmetry, speech difficulty, weakness and numbness.  Hematological: Negative for adenopathy. Does not bruise/bleed easily.  Psychiatric/Behavioral: Negative for agitation, confusion, hallucinations, self-injury and suicidal ideas. The patient is not nervous/anxious.      Physical Exam Updated Vital Signs BP 115/85 (BP Location: Left Arm)   Pulse 79   Temp 98.1 F (  36.7 C) (Oral)   Resp 13   SpO2 98%   Physical Exam Vitals signs and nursing note reviewed.  Constitutional:      General: He is not in acute distress.    Appearance: He is well-developed. He is not toxic-appearing.  HENT:     Head: Normocephalic and atraumatic.     Right Ear: Tympanic membrane and external ear normal.     Left Ear: Tympanic membrane and external ear normal.  Eyes:     General: Lids are normal. No scleral icterus.       Right eye: No discharge.        Left eye:  No discharge.     Conjunctiva/sclera: Conjunctivae normal.     Pupils: Pupils are equal, round, and reactive to light.  Neck:     Musculoskeletal: Normal range of motion and neck supple.     Vascular: No carotid bruit.     Trachea: No tracheal deviation.  Cardiovascular:     Rate and Rhythm: Normal rate and regular rhythm.  Extrasystoles are present.    Pulses: Normal pulses.     Heart sounds: Normal heart sounds.  Pulmonary:     Effort: Pulmonary effort is normal. No respiratory distress.     Breath sounds: Normal breath sounds. No stridor. No wheezing or rales.  Abdominal:     General: Bowel sounds are normal. There is no distension.     Palpations: Abdomen is soft.     Tenderness: There is no abdominal tenderness. There is no guarding or rebound.  Musculoskeletal: Normal range of motion.        General: No tenderness.     Right lower leg: No edema.     Left lower leg: No edema.  Lymphadenopathy:     Head:     Right side of head: No submandibular adenopathy.     Left side of head: No submandibular adenopathy.     Cervical: No cervical adenopathy.  Skin:    General: Skin is warm and dry.     Findings: No rash.  Neurological:     Mental Status: He is alert and oriented to person, place, and time.     GCS: GCS eye subscore is 4. GCS verbal subscore is 5. GCS motor subscore is 6.     Cranial Nerves: No cranial nerve deficit.     Sensory: No sensory deficit.     Motor: No abnormal muscle tone or seizure activity.     Coordination: Coordination is intact. Coordination normal.  Psychiatric:        Speech: Speech normal.      ED Treatments / Results  Labs (all labs ordered are listed, but only abnormal results are displayed) Labs Reviewed  CBG MONITORING, ED - Abnormal; Notable for the following components:      Result Value   Glucose-Capillary 132 (*)    All other components within normal limits  BASIC METABOLIC PANEL  CBC  URINALYSIS, ROUTINE W REFLEX MICROSCOPIC   LIPASE, BLOOD  CBG MONITORING, ED  TROPONIN I (HIGH SENSITIVITY)    EKG EKG Interpretation  Date/Time:  Friday October 07 2018 16:09:24 EDT Ventricular Rate:  69 PR Interval:    QRS Duration: 126 QT Interval:  426 QTC Calculation: 457 R Axis:   -77 Text Interpretation:  Sinus rhythm Ventricular premature complex Consider left atrial enlargement RBBB and LAFB Probable anteroseptal infarct, old Nonspecific T abnormalities, lateral leads Baseline wander in lead(s) V6 Confirmed by Fredia Sorrow [phone]) on  10/07/2018 4:20:39 PM   Radiology No results found.  Procedures Procedures (including critical care time) CRITICAL CARE Performed by: Lily Kocher Total critical care time: **30* minutes Critical care time was exclusive of separately billable procedures and treating other patients. Critical care was necessary to treat or prevent imminent or life-threatening deterioration. Critical care was time spent personally by me on the following activities: development of treatment plan with patient and/or surrogate as well as nursing, discussions with consultants, evaluation of patient's response to treatment, examination of patient, obtaining history from patient or surrogate, ordering and performing treatments and interventions, ordering and review of laboratory studies, ordering and review of radiographic studies, pulse oximetry and re-evaluation of patient's condition.  Medications Ordered in ED Medications  sodium chloride flush (NS) 0.9 % injection 3 mL (has no administration in time range)     Initial Impression / Assessment and Plan / ED Course  I have reviewed the triage vital signs and the nursing notes.  Pertinent labs & imaging results that were available during my care of the patient were reviewed by me and considered in my medical decision making (see chart for details).          Final Clinical Impressions(s) / ED Diagnoses MDM  Vital signs stable. Pulse ox 98% on  room air. CBG 132, both wnl.  Vitals rechecked, pt is orthostatic. IV fluids given. CBC wnl. Bmet non-acute. Lipase wnl. Troponin neg for acute event. Magnesium low at 1.6.  Orthostatic vital signs rechecked, patient remains orthostatic.  The patient states he feels some better, but remains orthostatic. No new neurologic changes. Heart RRR with occasion extrasystoles.  I discussed with the patient the possibility of an admission for additional fluids and also additional work-up of the syncopal episode.  Patient is in agreement for admission.  Consult requested from triad hospitalist for admission.   Final diagnoses:  Syncope, unspecified syncope type  Idiopathic hypotension    ED Discharge Orders    None       Lily Kocher, PA-C 10/08/18 0009    Fredia Sorrow, MD 10/14/18 1553    Lily Kocher, PA-C 10/19/18 1737    Fredia Sorrow, MD 10/22/18 [phone]

## 2018-10-07 NOTE — ED Triage Notes (Signed)
Patient reports a syncopal episode approx 1.5 hours PTA. Was sitting in a chair getting a haircut. Patient states he has been feeling ill for about a week. Has had 30 lb weight loss in 5-6 months. C/o nausea and weakness. Denies CP.

## 2018-10-07 NOTE — H&P (Signed)
TRH H&P    Patient Demographics:    Eric Dennis, is a 53 y.o. male  MRN: [id number]  DOB - April 11, 1965  Admit Date - 10/07/2018  Referring MD/NP/PA:PA Marshell Levan  Outpatient Primary MD for the patient is [facility]  Patient coming from: Home  Chief complaint-syncope   HPI:    Eric Dennis  is a 53 y.o. male, with no known medical history aside from unexplained weight loss presents to the ED for syncopal episode.  Apparently, the patient was receiving a haircut and shave in his home, he had his head tilt back for the purpose of shaving his neck, he felt very hot and lightheaded, and then fell on the floor.  Patient reports that he was unconscious for approximately 30 seconds.  People were there to witness and nobody saw convulsions, or incontinence, and there was no evidence of a tongue bite. Patient reports that when he "came to" he knew where he was and was completely oriented. Patient reports no prolonged heat exposure, noxious stimuli, pain fear or stress prior to the syncopal episode.  Patient has no known history of heart disease or arrhythmia.  Patient reports no chest pain palpitations or shortness of breath before the syncopal episode.  Patient reports no family history of sudden cardiac death.  He does not use any illicit drugs.  He does drink alcohol at least 2 beers daily.  He was up the night before watching TV and drinking "a beer" until 3 AM.  He reports that he likely does not have adequate water intake.  Patient is not on any new antihypertensives, diuretics, antidiabetic agents.  In fact the only medication he takes at home is Advil.  He reports that head rotation does not reproduce his symptoms.  He has never had this happen before.  He does not believe it is associated, but reports that for several days prior he is felt nauseous.  He threw up last night, undigested food with no blood, and his  nausea was resolved since then.  Patient reports no associated fever, chills, shortness of breath, cough, abdominal pain, myalgias, diarrhea, dysuria, urinary frequency, or asymmetrical weakness.  Patient does report that he has had unexplained weight loss.  He reports that a GI physician is working him up for this.  He had a colonoscopy, the last one was in 2016, he reports he still losing weight.  He reports a 30 pound weight loss in 6 months.  He has not returned to his physician since 2016 for further outpatient work-up.  We discussed the importance of an outpatient work-up for this weight loss.  ED course  Upon arrival to the ED patient's temperature was 98.1 pulse of 71 respiratory rate 26 and patient was normotensive.  Orthostatic vital signs were done in the ED patient had his blood pressure dropped to 81/65.  He was given a 2 L bolus and orthostatic vitals were taken again.  His blood pressure again dropped to 89 over 70s.  Patient had a urinalysis that showed hazy amber urine with  no suspicion for UTI.  Chest x-ray showed no edema or consolidation.  Trops have been negative x2.  EKG reviewed by myself showed normal sinus rhythm with a rate of 69 premature ventricular complexes QTC 457 and no significant ST-T changes.  Lab work was done that showed a thrombocytopenia with platelets at 116, hyperglycemia (fasting sugar) of 119, and hypomagnesemia with a magnesium of 1.6.  Patient is being placed in observation for further work-up.    Review of systems:    In addition to the HPI above,  No chills, does admit to "feeling hot" as described above. No Headache, No changes with Vision or hearing, No problems swallowing food or Liquids, No Chest pain, Cough or Shortness of Breath, No Abdominal pain, resolved nausea, no Vomiting, bowel movements are regular, No Blood in stool or Urine, No dysuria, Developing Boil on back - had been I and Ded in the past.  No new joints pains-aches,  No new  weakness, tingling, numbness in any extremity, No recent weight gain or loss, No polyuria, polydypsia or polyphagia, No significant Mental Stressors.  All other systems reviewed and are negative.    Past History of the following :    Past Medical History:  Diagnosis Date   Weight loss       Past Surgical History:  Procedure Laterality Date   COLONOSCOPY N/A 03/14/2014   Procedure: COLONOSCOPY;  Surgeon: Rogene Houston, MD;  Location: [facility];  Service: Endoscopy;  Laterality: N/A;  730 - moved to 1/6 @ 12:00 - Ann notified pt   NO PAST SURGERIES        Social History:      Social History   Tobacco Use   Smoking status: Current Every Day Smoker    Packs/day: 0.25    Years: 20.00    Pack years: 5.00    Types: Cigarettes   Smokeless tobacco: Never Used   Tobacco comment: 1 pack every 4 days x 20 yrs  Substance Use Topics   Alcohol use: Yes    Alcohol/week: 14.0 standard drinks    Types: 14 Cans of beer per week    Comment: 1-2 beers a day.       Family History :     Family History  Problem Relation Age of Onset   Diabetes Mother    Reports HTN and DM run in his family.    Home Medications:   Prior to Admission medications   Medication Sig Start Date End Date Taking? Authorizing Provider  ibuprofen (ADVIL) 400 MG tablet Take 400 mg by mouth every 6 (six) hours as needed.   Yes [provider]     Allergies:    No Known Allergies   Physical Exam:   Vitals  Blood pressure (!) 153/88, pulse 68, temperature 98.1 F (36.7 C), temperature source Oral, resp. rate 13, SpO2 99 %.  1.  General: Lying supine in bed in no acute distress  2. Psychiatric: Intact insight and judgment, alert and oriented x3  3. Neurologic: 5 out of 5 strength in all 4 extremities.  Cranial nerves II through XII grossly intact.  4. HEENMT:  Normocephalic atraumatic, poor dentition, mucous membranes are moist, no palpable masses in neck, trachea  midline  5. Respiratory : Lungs are clear to auscultation bilaterally with no wheezes rhonchi or crackles  6. Cardiovascular : H RRR, no murmurs rubs or gallops  7. Gastrointestinal:  Abdomen is soft nontender to palpation normal bowel sounds  8. Skin:  Round boil present at midline on the thoracic back, nontender to palpation, no drainage.      Data Review:    CBC Recent Labs  Lab 10/07/18 1625  WBC 4.5  HGB 13.6  HCT 38.6*  PLT 116*  MCV 99.2  MCH 35.0*  MCHC 35.2  RDW 14.9   ------------------------------------------------------------------------------------------------------------------  Results for orders placed or performed during the hospital encounter of 10/07/18 (from the past 48 hour(s))  Urinalysis, Routine w reflex microscopic     Status: Abnormal   Collection Time: 10/07/18  4:15 PM  Result Value Ref Range   Color, Urine AMBER (A) YELLOW    Comment: BIOCHEMICALS MAY BE AFFECTED BY COLOR   APPearance HAZY (A) CLEAR   Specific Gravity, Urine 1.025 1.005 - 1.030   pH 6.0 5.0 - 8.0   Glucose, UA NEGATIVE NEGATIVE mg/dL   Hgb urine dipstick NEGATIVE NEGATIVE   Bilirubin Urine MODERATE (A) NEGATIVE   Ketones, ur NEGATIVE NEGATIVE mg/dL   Protein, ur NEGATIVE NEGATIVE mg/dL   Nitrite NEGATIVE NEGATIVE   Leukocytes,Ua NEGATIVE NEGATIVE    Comment: Performed at [facility], [address], [place], [place] [address]  CBG monitoring, ED     Status: Abnormal   Collection Time: 10/07/18  4:18 PM  Result Value Ref Range   Glucose-Capillary 132 (H) 70 - 99 mg/dL  Basic metabolic panel     Status: Abnormal   Collection Time: 10/07/18  4:25 PM  Result Value Ref Range   Sodium 136 135 - 145 mmol/L   Potassium 3.8 3.5 - 5.1 mmol/L   Chloride 99 98 - 111 mmol/L   CO2 27 22 - 32 mmol/L   Glucose, Bld 119 (H) 70 - 99 mg/dL   BUN 10 6 - 20 mg/dL   Creatinine, Ser 0.73 0.61 - 1.24 mg/dL   Calcium 8.5 (L) 8.9 - 10.3 mg/dL   GFR calc non Af Amer >60 >60 mL/min    GFR calc Af Amer >60 >60 mL/min   Anion gap 10 5 - 15    Comment: Performed at [facility], [address], [place], [place] [address]  CBC     Status: Abnormal   Collection Time: 10/07/18  4:25 PM  Result Value Ref Range   WBC 4.5 4.0 - 10.5 K/uL   RBC 3.89 (L) 4.22 - 5.81 MIL/uL   Hemoglobin 13.6 13.0 - 17.0 g/dL   HCT 38.6 (L) 39.0 - 52.0 %   MCV 99.2 80.0 - 100.0 fL   MCH 35.0 (H) 26.0 - 34.0 pg   MCHC 35.2 30.0 - 36.0 g/dL   RDW 14.9 11.5 - 15.5 %   Platelets 116 (L) 150 - 400 K/uL    Comment: PLATELET COUNT CONFIRMED BY SMEAR SPECIMEN CHECKED FOR CLOTS Immature Platelet Fraction may be clinically indicated, consider ordering this additional test [id number]    nRBC 0.0 0.0 - 0.2 %    Comment: Performed at [facility], [address] [place], [place] [address]  Lipase, blood     Status: None   Collection Time: 10/07/18  4:25 PM  Result Value Ref Range   Lipase 42 11 - 51 U/L    Comment: Performed at [facility], [address] [place], [place] [address]  Troponin I (High Sensitivity)     Status: None   Collection Time: 10/07/18  4:25 PM  Result Value Ref Range   Troponin I (High Sensitivity) 3 <18 ng/L    Comment: (NOTE) Elevated high sensitivity troponin  I (hsTnI) values and significant  changes across serial measurements may suggest ACS but many other  chronic and acute conditions are known to elevate hsTnI results.  Refer to the "Links" section for chest pain algorithms and additional  guidance. Performed at [facility], [address] [place], [place] [address]   Magnesium     Status: Abnormal   Collection Time: 10/07/18  4:25 PM  Result Value Ref Range   Magnesium 1.6 (L) 1.7 - 2.4 mg/dL    Comment: Performed at [facility], [address], [place],  [address]  Troponin I (High Sensitivity)     Status: None   Collection Time: 10/07/18  6:03 PM  Result Value Ref Range   Troponin I (High Sensitivity) 3 <18 ng/L    Comment: (NOTE) Elevated high  sensitivity troponin I (hsTnI) values and significant  changes across serial measurements may suggest ACS but many other  chronic and acute conditions are known to elevate hsTnI results.  Refer to the "Links" section for chest pain algorithms and additional  guidance. Performed at [facility], [address], [place],  [address]     Chemistries  Recent Labs  Lab 10/07/18 1625  NA 136  K 3.8  CL 99  CO2 27  GLUCOSE 119*  BUN 10  CREATININE 0.73  CALCIUM 8.5*  MG 1.6*   ------------------------------------------------------------------------------------------------------------------  ------------------------------------------------------------------------------------------------------------------ GFR: CrCl cannot be calculated (Unknown ideal weight.). Liver Function Tests: No results for input(s): AST, ALT, ALKPHOS, BILITOT, PROT, ALBUMIN in the last 168 hours. Recent Labs  Lab 10/07/18 1625  LIPASE 42   No results for input(s): AMMONIA in the last 168 hours. Coagulation Profile: No results for input(s): INR, PROTIME in the last 168 hours. Cardiac Enzymes: No results for input(s): CKTOTAL, CKMB, CKMBINDEX, TROPONINI in the last 168 hours. BNP (last 3 results) No results for input(s): PROBNP in the last 8760 hours. HbA1C: No results for input(s): HGBA1C in the last 72 hours. CBG: Recent Labs  Lab 10/07/18 1618  GLUCAP 132*   Lipid Profile: No results for input(s): CHOL, HDL, LDLCALC, TRIG, CHOLHDL, LDLDIRECT in the last 72 hours. Thyroid Function Tests: No results for input(s): TSH, T4TOTAL, FREET4, T3FREE, THYROIDAB in the last 72 hours. Anemia Panel: No results for input(s): VITAMINB12, FOLATE, FERRITIN, TIBC, IRON, RETICCTPCT in the last 72 hours.  --------------------------------------------------------------------------------------------------------------- Urine analysis:    Component Value Date/Time   COLORURINE AMBER (A) 10/07/2018 1615    APPEARANCEUR HAZY (A) 10/07/2018 1615   LABSPEC 1.025 10/07/2018 1615   PHURINE 6.0 10/07/2018 1615   GLUCOSEU NEGATIVE 10/07/2018 1615   HGBUR NEGATIVE 10/07/2018 1615   BILIRUBINUR MODERATE (A) 10/07/2018 1615   KETONESUR NEGATIVE 10/07/2018 1615   PROTEINUR NEGATIVE 10/07/2018 1615   NITRITE NEGATIVE 10/07/2018 1615   LEUKOCYTESUR NEGATIVE 10/07/2018 1615      Imaging Results:    Dg Chest Portable 1 View  Result Date: 10/07/2018 CLINICAL DATA:  Syncope EXAM: PORTABLE CHEST 1 VIEW COMPARISON:  December 20, 2013 FINDINGS: Lungs are clear. Heart size and pulmonary vascularity are normal. No adenopathy. No bone lesions. No pneumothorax. IMPRESSION: No edema or consolidation. Electronically Signed   By: Lowella Grip III M.D.   On: 10/07/2018 17:38    My personal review of EKG: Rhythm NSR, Rate69 /min, QTc 457 nosignificant ST changes   Assessment & Plan:    Active Problems:   * No active hospital problems. *   1. Syncope 1. Patient has a [place] syncope risk score of 2  points indicating medium risk.  Given the orthostatic vital signs patient's etiology for syncopal event is orthostatic hypotension-which is likely secondary to alcoholism/dehydration.  However at this point arrhythmia cannot be ruled out.  We will monitor patient on telemetry.  Structural heart disease also cannot be ruled out, especially with the EKG showing an old infarct.  We will obtain an echo tomorrow.  Carotid sinus syndrome is low on the differential, as carotid massage did not reproduce his symptoms.  However the syncopal event did happen when he was shaving - so this may be something to consider in the future if he has a syncopal even under similar circumstances - when he is adequately hydrated.  The recommendation from ACP is to not perform imaging of the carotid arteries for simple syncope without neurologic symptoms, so at this point carotid ultrasound will not be ordered, as patient is not having any  neurologic symptoms.  ACP also does not recommend head imaging without other neurologic symptoms. 2. Orthostatic hypotension persistent after 2 L bolus 1. After 2 L bolus patient's BP was 89/73 upon standing. Orthostatic hypotension is likely 2/2 dehydration and alcohol intake. Will continue hydration with 140ml/hr NS.  3. Dehydration 1. 2 L bolus given in the ED. Encourage PO fluid intake. Continue NS at 139ml/hr. 4. Hyperglycemia 1. Patient reports that he had not had anything to eat for more than 12 hours before his arrival to the hospital. His glucose was 119 - which is elevated for a fasting sugar. It is possible that the pateint is an undiagnosed Diabetic. Will get Hgb A1C in the am.  5. Unexplained weight loss 1. Encouraged outpatient follow up for further workup.  6. Hypomagnesemia 1. Replace with 2g of IV mag. Continue to monitor with CMP in the am. 7. Thrombocytopenia 1. Likely secondary to alcoholism. Continue to monitor with daily CBCs. Encouraged outpatient follow up for further workup. Patient reports no signs/symptoms of bleed at this time. 8. Alcoholism 1. [place] protocol. Thiamine and Folate in the am.    DVT Prophylaxis-   Lovenox - SCDs   AM Labs Ordered, also please review Full Orders  Family Communication: Admission, patients condition and plan of care including tests being ordered have been discussed with the patient verbalized understanding and agree with the plan and Code Status.  Code Status:  FULL  Admission status: Observation: Based on patients clinical presentation and evaluation of above clinical data, I have made determination that patient meets obs criteria at this time.  Time spent in minutes : 47   Rolla Plate M.D on 10/07/2018 at 10:18 PM

## 2018-10-07 NOTE — ED Notes (Signed)
NaCl bolus finished. Stood patient up, BP dropped to 89/73. NAD. [place] notified

## 2018-10-08 ENCOUNTER — Observation Stay (HOSPITAL_BASED_OUTPATIENT_CLINIC_OR_DEPARTMENT_OTHER): Payer: BC Managed Care – PPO

## 2018-10-08 ENCOUNTER — Observation Stay (HOSPITAL_COMMUNITY): Payer: BC Managed Care – PPO

## 2018-10-08 DIAGNOSIS — I6523 Occlusion and stenosis of bilateral carotid arteries: Secondary | ICD-10-CM | POA: Diagnosis not present

## 2018-10-08 DIAGNOSIS — R55 Syncope and collapse: Secondary | ICD-10-CM

## 2018-10-08 DIAGNOSIS — I951 Orthostatic hypotension: Secondary | ICD-10-CM

## 2018-10-08 DIAGNOSIS — R740 Nonspecific elevation of levels of transaminase and lactic acid dehydrogenase [LDH]: Secondary | ICD-10-CM

## 2018-10-08 DIAGNOSIS — F17200 Nicotine dependence, unspecified, uncomplicated: Secondary | ICD-10-CM

## 2018-10-08 DIAGNOSIS — D696 Thrombocytopenia, unspecified: Secondary | ICD-10-CM | POA: Diagnosis not present

## 2018-10-08 DIAGNOSIS — R7401 Elevation of levels of liver transaminase levels: Secondary | ICD-10-CM

## 2018-10-08 DIAGNOSIS — R748 Abnormal levels of other serum enzymes: Secondary | ICD-10-CM

## 2018-10-08 DIAGNOSIS — K828 Other specified diseases of gallbladder: Secondary | ICD-10-CM | POA: Diagnosis not present

## 2018-10-08 LAB — COMPREHENSIVE METABOLIC PANEL
ALT: 147 U/L — ABNORMAL HIGH (ref 0–44)
AST: 304 U/L — ABNORMAL HIGH (ref 15–41)
Albumin: 2.7 g/dL — ABNORMAL LOW (ref 3.5–5.0)
Alkaline Phosphatase: 138 U/L — ABNORMAL HIGH (ref 38–126)
Anion gap: 9 (ref 5–15)
BUN: 8 mg/dL (ref 6–20)
CO2: 27 mmol/L (ref 22–32)
Calcium: 8.3 mg/dL — ABNORMAL LOW (ref 8.9–10.3)
Chloride: 101 mmol/L (ref 98–111)
Creatinine, Ser: 0.72 mg/dL (ref 0.61–1.24)
GFR calc Af Amer: >60 mL/min (ref >60)
GFR calc non Af Amer: >60 mL/min (ref >60)
Glucose, Bld: 111 mg/dL — ABNORMAL HIGH (ref 70–99)
Potassium: 4.2 mmol/L (ref 3.5–5.1)
Sodium: 137 mmol/L (ref 135–145)
Total Bilirubin: 2.7 mg/dL — ABNORMAL HIGH (ref 0.3–1.2)
Total Protein: 5.6 g/dL — ABNORMAL LOW (ref 6.5–8.1)

## 2018-10-08 LAB — VITAMIN B12: Vitamin B-12: 1522 pg/mL — ABNORMAL HIGH (ref 180–914)

## 2018-10-08 LAB — CBC
HCT: 33.8 % — ABNORMAL LOW (ref 39.0–52.0)
Hemoglobin: 12.2 g/dL — ABNORMAL LOW (ref 13.0–17.0)
MCH: 35.6 pg — ABNORMAL HIGH (ref 26.0–34.0)
MCHC: 36.1 g/dL — ABNORMAL HIGH (ref 30.0–36.0)
MCV: 98.5 fL (ref 80.0–100.0)
Platelets: 104 10*3/uL — ABNORMAL LOW (ref 150–400)
RBC: 3.43 MIL/uL — ABNORMAL LOW (ref 4.22–5.81)
RDW: 14.7 % (ref 11.5–15.5)
WBC: 5.8 10*3/uL (ref 4.0–10.5)
nRBC: 0 % (ref 0.0–0.2)

## 2018-10-08 LAB — ECHOCARDIOGRAM COMPLETE
Height: 75 in
Weight: 2328.06 oz

## 2018-10-08 LAB — HEMOGLOBIN A1C
Hgb A1c MFr Bld: 5.7 % — ABNORMAL HIGH (ref 4.8–5.6)
Mean Plasma Glucose: 116.89 mg/dL

## 2018-10-08 LAB — FOLATE: Folate: 2.3 ng/mL — ABNORMAL LOW

## 2018-10-08 LAB — GLUCOSE, CAPILLARY: Glucose-Capillary: 97 mg/dL (ref 70–99)

## 2018-10-08 LAB — TSH: TSH: 3.978 u[IU]/mL (ref 0.350–4.500)

## 2018-10-08 MED ORDER — VITAMIN B-1 100 MG PO TABS
100.0000 mg | ORAL_TABLET | Freq: Every day | ORAL | Status: DC
  Administered 2018-10-08 – 2018-10-09 (×2): 100 mg via ORAL
  Filled 2018-10-08 (×2): qty 1

## 2018-10-08 MED ORDER — SODIUM CHLORIDE 0.9 % IV SOLN
INTRAVENOUS | Status: DC
  Administered 2018-10-08 – 2018-10-09 (×2): via INTRAVENOUS

## 2018-10-08 MED ORDER — SODIUM CHLORIDE 0.9% FLUSH
3.0000 mL | Freq: Two times a day (BID) | INTRAVENOUS | Status: DC
  Administered 2018-10-09: 3 mL via INTRAVENOUS

## 2018-10-08 MED ORDER — FOLIC ACID 1 MG PO TABS
1.0000 mg | ORAL_TABLET | Freq: Every day | ORAL | Status: DC
  Administered 2018-10-08 – 2018-10-09 (×2): 1 mg via ORAL
  Filled 2018-10-08 (×2): qty 1

## 2018-10-08 MED ORDER — SENNOSIDES-DOCUSATE SODIUM 8.6-50 MG PO TABS
1.0000 | ORAL_TABLET | Freq: Every evening | ORAL | Status: DC | PRN
  Filled 2018-10-08: qty 1

## 2018-10-08 MED ORDER — ONDANSETRON HCL 4 MG PO TABS: 4.0000 mg | ORAL_TABLET | Freq: Four times a day (QID) | ORAL | Status: DC | PRN

## 2018-10-08 MED ORDER — ACETAMINOPHEN 650 MG RE SUPP: 650.0000 mg | Freq: Four times a day (QID) | RECTAL | Status: DC | PRN

## 2018-10-08 MED ORDER — ENSURE ENLIVE PO LIQD
237.0000 mL | Freq: Two times a day (BID) | ORAL | Status: DC
  Administered 2018-10-08 – 2018-10-09 (×3): 237 mL via ORAL

## 2018-10-08 MED ORDER — MAGNESIUM SULFATE 2 GM/50ML IV SOLN
2.0000 g | Freq: Once | INTRAVENOUS | Status: AC
  Administered 2018-10-08: 2 g via INTRAVENOUS
  Filled 2018-10-08: qty 50

## 2018-10-08 MED ORDER — ONDANSETRON HCL 4 MG/2ML IJ SOLN: 4.0000 mg | Freq: Four times a day (QID) | INTRAMUSCULAR | Status: DC | PRN

## 2018-10-08 MED ORDER — ALUM & MAG HYDROXIDE-SIMETH 200-200-20 MG/5ML PO SUSP: 30.0000 mL | Freq: Four times a day (QID) | ORAL | Status: DC | PRN

## 2018-10-08 MED ORDER — TRAMADOL HCL 50 MG PO TABS: 50.0000 mg | ORAL_TABLET | Freq: Four times a day (QID) | ORAL | Status: DC | PRN

## 2018-10-08 MED ORDER — ENOXAPARIN SODIUM 40 MG/0.4ML ~~LOC~~ SOLN
40.0000 mg | SUBCUTANEOUS | Status: DC
  Administered 2018-10-08 (×2): 40 mg via SUBCUTANEOUS
  Filled 2018-10-08 (×3): qty 0.4

## 2018-10-08 MED ORDER — ACETAMINOPHEN 325 MG PO TABS: 650.0000 mg | ORAL_TABLET | Freq: Four times a day (QID) | ORAL | Status: DC | PRN

## 2018-10-08 MED ORDER — MORPHINE SULFATE (PF) 2 MG/ML IV SOLN: 2.0000 mg | INTRAVENOUS | Status: DC | PRN

## 2018-10-08 NOTE — Progress Notes (Signed)
Initial Nutrition Assessment  DOCUMENTATION CODES:   Underweight  INTERVENTION:  Ensure Enlive po BID, each supplement provides 350 kcal and 20 grams of protein MVI Monitor magnesium, potassium, and phosphorus daily for at least 3 days, MD to replete as needed, as pt is at risk for refeeding syndrome given reported chronic EtOH use    NUTRITION DIAGNOSIS:   Inadequate oral intake related to social / environmental circumstances(daily EtOH x 83yrs) as evidenced by energy intake < 75% for > or equal to 3 months(pt reported per chart review).   GOAL:   Weight gain, Patient will meet greater than or equal to 90% of their needs    MONITOR:   PO intake, Labs, Weight trends, Supplement acceptance  REASON FOR ASSESSMENT:   Malnutrition Screening Tool    ASSESSMENT:  RD working remotely.   53 yo male with medical history significant of EtOH dependence presented to ED after an episode of syncope and noted to be orthostatic. Patient received 2L saline in ED and remained orthostatic and admitted for further evaluation  Per chart review, pt was getting a haircut and a shave with his head tilted backward and felt lightheaded after getting up before episode of syncope and noted to be unconscious for 30-60 seconds. Patient reports drinking regularly for the past 20 years; 2 beers daily and wine 3-4 times per week.  Unable to reach patient via phone this afternoon; pt out of room for ultrasound.  Patient reports poor po for the past several months. Suspect patient to have mod/severe malnutrition   100% of meals and ONS at this time  Current wt - 66 kg (145 lbs) 74% of ideal body wt No recent weight history for review  Medications reviewed and include: folic acid, thiamine, MVI  Labs:  Ca corrects for low albumin - 9.34 AST 304 ALT 147  NUTRITION - FOCUSED PHYSICAL EXAM: Unable to complete at this time   Diet Order:   Diet Order            Diet heart healthy/carb modified  Room service appropriate? Yes; Fluid consistency: Thin  Diet effective now              EDUCATION NEEDS:   No education needs have been identified at this time  Skin:  Skin Assessment: Reviewed RN Assessment  Last BM:  7/30  Height:   Ht Readings from Last 1 Encounters:  10/08/18 6\' 3"  (1.905 m)    Weight:   Wt Readings from Last 1 Encounters:  10/08/18 66 kg    Ideal Body Weight:  89.1 kg  BMI:  Body mass index is 18.19 kg/m.  Estimated Nutritional Needs:   Kcal:  2000-2200  Protein:  100-110  Fluid:  >2L   Lajuan Lines, RD, LDN  After Hours/Weekend Pager: [phone]

## 2018-10-08 NOTE — Progress Notes (Signed)
2d echo done

## 2018-10-08 NOTE — Progress Notes (Signed)
PROGRESS NOTE  Eric Dennis JJH:[id number] DOB: 1965-06-12 DOA: 10/07/2018 PCP: Alanson Puls [facility]  Brief History:  53 year old male with a history of alcohol dependence and no other documented chronic medical problems presenting with syncope on the afternoon of 10/07/2018.  The patient states that he was sitting in his chair for haircut and had chest finished getting a shave with his head tilted backward.  When he got up, the patient felt lightheaded which resulted in a syncopal episode.  Bystanders noted that the patient was unconscious for approximately 30 to 60 seconds.  There was no tonic-clonic activity or tongue biting or incontinence.  The patient was alert and oriented when he woke up.  The patient states that he has had poor oral intake for several months.  In addition, he has had symptoms of dizziness when he first gets up from a recumbent position.  He denies any recent fevers, chills, chest discomfort, shortness breath, coughing, hemoptysis, nausea, vomiting, diarrhea, abdominal pain, dysuria.  Upon presentation, the patient was noted to be orthostatic.  He was given 2 L of normal saline in the emergency department.  He remained orthostatic.  As result, the patient was admitted for further evaluation.  Unfortunately, the patient continues to drink at least 2 beers on a daily basis.  He states that he drinks wine 3-4 times per week.  He has been drinking regularly for 20 years.  Assessment/Plan: Syncope -Secondary to orthostatic hypotension -Echocardiogram -Personally reviewed telemetry--no concerning dysrhythmias -Personally reviewed EKG--sinus rhythm, no ST-T wave changes; old Q waves noted -10/08/2018 repeat orthostatics--remain positive -Continue IV fluids -Given the patient's history of syncope with his head tilted back--obtain ultrasound to vertebrobasilar system  Orthostatic hypotension -10/08/2018 repeat orthostatics remain positive -Patient may have a component  of dysautonomia secondary to his chronic alcohol use -Continue IV fluids  Alcohol dependence -Alcohol withdrawal protocol  Folic acid deficiency -Start daily folic acid  Thrombocytopenia -Secondary to chronic alcohol use -Suspect underlying alcoholic liver disease -folate = 2.3 -B12--1522 -TSH 3.978  Hypomagnesemia -Replete  Transaminasemia -due to Etoh -check Hep B surface antigen -check Hep C antibody -RUQ ultrasound -am LFTs  Tobacco abuse -I have discussed tobacco cessation with the patient.  I have counseled the patient regarding the negative impacts of continued tobacco use including but not limited to lung cancer, COPD, and cardiovascular disease.  I have discussed alternatives to tobacco and modalities that may help facilitate tobacco cessation including but not limited to biofeedback, hypnosis, and medications.  Total time spent with tobacco counseling was 4 minutes.    Total time spent 35 minutes.  Greater than 50% spent face to face counseling and coordinating care.   Disposition Plan:   Home 8/2 if stable Family Communication:  No Family at bedside  Consultants:  none  Code Status:  FULL   DVT Prophylaxis:   [place] Lovenox   Procedures: As Listed in Progress Note Above  Antibiotics: None     Subjective: Patient denies fevers, chills, headache, chest pain, dyspnea, nausea, vomiting, diarrhea, abdominal pain, dysuria, hematuria, hematochezia, and melena.   Objective: Vitals:   10/08/18 0030 10/08/18 0135 10/08/18 0143 10/08/18 0635  BP: 111/83 135/86  133/87  Pulse: 70 72  (!) 54  Resp: 20 18  18   Temp:   98.6 F (37 C) 98.1 F (36.7 C)  TempSrc:   Oral Oral  SpO2: 98% 100%  100%  Weight:  65.3 kg  66 kg  Height:  6\' 3"  (1.905 m)     No intake or output data in the 24 hours ending 10/08/18 0959 Weight change:  Exam:   General:  Pt is alert, follows commands appropriately, not in acute distress  HEENT: No icterus, No thrush, No neck  mass, [place]/AT  Cardiovascular: RRR, S1/S2, no rubs, no gallops  Respiratory: CTA bilaterally, no wheezing, no crackles, no rhonchi  Abdomen: Soft/+BS, non tender, non distended, no guarding  Extremities: No edema, No lymphangitis, No petechiae, No rashes, no synovitis   Data Reviewed: I have personally reviewed following labs and imaging studies Basic Metabolic Panel: Recent Labs  Lab 10/07/18 1625 10/08/18 0528  NA 136 137  K 3.8 4.2  CL 99 101  CO2 27 27  GLUCOSE 119* 111*  BUN 10 8  CREATININE 0.73 0.72  CALCIUM 8.5* 8.3*  MG 1.6*  --    Liver Function Tests: Recent Labs  Lab 10/08/18 0528  AST 304*  ALT 147*  ALKPHOS 138*  BILITOT 2.7*  PROT 5.6*  ALBUMIN 2.7*   Recent Labs  Lab 10/07/18 1625  LIPASE 42   No results for input(s): AMMONIA in the last 168 hours. Coagulation Profile: No results for input(s): INR, PROTIME in the last 168 hours. CBC: Recent Labs  Lab 10/07/18 1625 10/08/18 0528  WBC 4.5 5.8  HGB 13.6 12.2*  HCT 38.6* 33.8*  MCV 99.2 98.5  PLT 116* 104*   Cardiac Enzymes: No results for input(s): CKTOTAL, CKMB, CKMBINDEX, TROPONINI in the last 168 hours. BNP: Invalid input(s): POCBNP CBG: Recent Labs  Lab 10/07/18 1618 10/08/18 0722  GLUCAP 132* 97   HbA1C: No results for input(s): HGBA1C in the last 72 hours. Urine analysis:    Component Value Date/Time   COLORURINE AMBER (A) 10/07/2018 1615   APPEARANCEUR HAZY (A) 10/07/2018 1615   LABSPEC 1.025 10/07/2018 1615   PHURINE 6.0 10/07/2018 1615   GLUCOSEU NEGATIVE 10/07/2018 1615   HGBUR NEGATIVE 10/07/2018 1615   BILIRUBINUR MODERATE (A) 10/07/2018 1615   KETONESUR NEGATIVE 10/07/2018 1615   PROTEINUR NEGATIVE 10/07/2018 1615   NITRITE NEGATIVE 10/07/2018 1615   LEUKOCYTESUR NEGATIVE 10/07/2018 1615   Sepsis Labs: @LABRCNTIP (procalcitonin:4,lacticidven:4) ) Recent Results (from the past 240 hour(s))  Novel Coronavirus, NAA ([facility])     Status: None   Collection  Time: 10/04/18  9:48 AM   Specimen: Nasal Swab  Result Value Ref Range Status   SARS-CoV-2, NAA Not Detected Not Detected Final    Comment: This test was developed and its performance characteristics determined by [employer]. This test has not been FDA cleared or approved. This test has been authorized by FDA under an Emergency Use Authorization (EUA). This test is only authorized for the duration of time the declaration that circumstances exist justifying the authorization of the emergency use of in vitro diagnostic tests for detection of SARS-CoV-2 virus and/or diagnosis of COVID-19 infection under section 564(b)(1) of the Act, 21 U.S.C. 956OZH-0(Q)(6), unless the authorization is terminated or revoked sooner. When diagnostic testing is negative, the possibility of a false negative result should be considered in the context of a patient's recent exposures and the presence of clinical signs and symptoms consistent with COVID-19. An individual without symptoms of COVID-19 and who is not shedding SARS-CoV-2 virus would expect to have a negative (not detected) result in this assay.   SARS Coronavirus 2 [facility] order, Performed in [facility] hospital lab) Nasopharyngeal Nasopharyngeal Swab     Status: None   Collection Time:  10/07/18  9:52 PM   Specimen: Nasopharyngeal Swab  Result Value Ref Range Status   SARS Coronavirus 2 NEGATIVE NEGATIVE Final    Comment: (NOTE) If result is NEGATIVE SARS-CoV-2 target nucleic acids are NOT DETECTED. The SARS-CoV-2 RNA is generally detectable in upper and lower  respiratory specimens during the acute phase of infection. The lowest  concentration of SARS-CoV-2 viral copies this assay can detect is 250  copies / mL. A negative result does not preclude SARS-CoV-2 infection  and should not be used as the sole basis for treatment or other  patient management decisions.  A negative result may occur with  improper specimen collection /  handling, submission of specimen other  than nasopharyngeal swab, presence of viral mutation(s) within the  areas targeted by this assay, and inadequate number of viral copies  (<250 copies / mL). A negative result must be combined with clinical  observations, patient history, and epidemiological information. If result is POSITIVE SARS-CoV-2 target nucleic acids are DETECTED. The SARS-CoV-2 RNA is generally detectable in upper and lower  respiratory specimens dur ing the acute phase of infection.  Positive  results are indicative of active infection with SARS-CoV-2.  Clinical  correlation with patient history and other diagnostic information is  necessary to determine patient infection status.  Positive results do  not rule out bacterial infection or co-infection with other viruses. If result is PRESUMPTIVE POSTIVE SARS-CoV-2 nucleic acids MAY BE PRESENT.   A presumptive positive result was obtained on the submitted specimen  and confirmed on repeat testing.  While 2019 novel coronavirus  (SARS-CoV-2) nucleic acids may be present in the submitted sample  additional confirmatory testing may be necessary for epidemiological  and / or clinical management purposes  to differentiate between  SARS-CoV-2 and other Sarbecovirus currently known to infect humans.  If clinically indicated additional testing with an alternate test  methodology [phone]) is advised. The SARS-CoV-2 RNA is generally  detectable in upper and lower respiratory sp ecimens during the acute  phase of infection. The expected result is Negative. Fact Sheet for Patients:  StrictlyIdeas.no Fact Sheet for Healthcare Providers: BankingDealers.co.za This test is not yet approved or cleared by the [place] FDA and has been authorized for detection and/or diagnosis of SARS-CoV-2 by FDA under an Emergency Use Authorization (EUA).  This EUA will remain in effect (meaning this  test can be used) for the duration of the COVID-19 declaration under Section 564(b)(1) of the Act, 21 U.S.C. section 360bbb-3(b)(1), unless the authorization is terminated or revoked sooner. Performed at [facility], [address], [place], [place] [address]      Scheduled Meds:  enoxaparin (LOVENOX) injection  40 mg Subcutaneous Q24H   feeding supplement (ENSURE ENLIVE)  237 mL Oral BID BM   folic acid  1 mg Oral Daily   multivitamin with minerals  1 tablet Oral Daily   sodium chloride flush  3 mL Intravenous Once   sodium chloride flush  3 mL Intravenous Q12H   thiamine  100 mg Oral Daily   Continuous Infusions:  sodium chloride 125 mL/hr at 10/08/18 6754   sodium chloride 125 mL/hr at 10/07/18 2040   sodium chloride 100 mL/hr at 10/08/18 4920    Procedures/Studies: US Carotid Bilateral  Result Date: 10/08/2018 CLINICAL DATA:  Syncopal episode, smoker EXAM: BILATERAL CAROTID DUPLEX ULTRASOUND TECHNIQUE: Pearline Cables scale imaging, color Doppler and duplex ultrasound were performed of bilateral carotid and vertebral arteries in the neck. COMPARISON:  None. FINDINGS: Criteria: Quantification of  carotid stenosis is based on velocity parameters that correlate the residual internal carotid diameter with NASCET-based stenosis levels, using the diameter of the distal internal carotid lumen as the denominator for stenosis measurement. The following velocity measurements were obtained: RIGHT ICA: 88/25 cm/sec CCA: 77/82 cm/sec SYSTOLIC ICA/CCA RATIO:  1.1 ECA: 58 cm/sec LEFT ICA: 62/20 cm/sec CCA: 42/35 cm/sec SYSTOLIC ICA/CCA RATIO:  0.7 ECA: 59 cm/sec RIGHT CAROTID ARTERY: Minor echogenic shadowing plaque formation. No hemodynamically significant right ICA stenosis, velocity elevation, or turbulent flow. Degree of narrowing less than 50%. RIGHT VERTEBRAL ARTERY:  Antegrade LEFT CAROTID ARTERY: Similar scattered minor echogenic plaque formation. No hemodynamically significant left ICA stenosis,  velocity elevation, or turbulent flow. LEFT VERTEBRAL ARTERY:  Antegrade IMPRESSION: Minor carotid atherosclerosis. No hemodynamically significant ICA stenosis. Degree of narrowing less than 50% bilaterally by ultrasound criteria. Patent antegrade vertebral flow bilaterally Electronically Signed   By: Jerilynn Mages.  Shick M.D.   On: 10/08/2018 09:50   Dg Chest Portable 1 View  Result Date: 10/07/2018 CLINICAL DATA:  Syncope EXAM: PORTABLE CHEST 1 VIEW COMPARISON:  December 20, 2013 FINDINGS: Lungs are clear. Heart size and pulmonary vascularity are normal. No adenopathy. No bone lesions. No pneumothorax. IMPRESSION: No edema or consolidation. Electronically Signed   By: Lowella Grip III M.D.   On: 10/07/2018 17:38   US Abdomen Limited Ruq  Result Date: 10/08/2018 CLINICAL DATA:  Elevated LFTs EXAM: ULTRASOUND ABDOMEN LIMITED RIGHT UPPER QUADRANT COMPARISON:  02/06/2014 FINDINGS: Gallbladder: Minor layering hypoechoic gallbladder sludge. Negative for gallstones, wall thickening or a Murphy's sign. No pericholecystic fluid or signs of cholecystitis Common bile duct: Diameter: 3.6 mm Liver: No focal lesion identified. Within normal limits in parenchymal echogenicity. Portal vein is patent on color Doppler imaging with normal direction of blood flow towards the liver. Other: No free fluid or ascites IMPRESSION: Minor gallbladder sludge noted. No other acute finding by ultrasound Electronically Signed   By: Jerilynn Mages.  Shick M.D.   On: 10/08/2018 09:52    Orson Eva, DO  [facility] Pager [phone]  If 7PM-7AM, please contact night-coverage [email or web address] Password [email or web address] 10/08/2018, 9:59 AM   LOS: 0 days

## 2018-10-08 NOTE — Progress Notes (Signed)
Has denied dizziness today.  Ambulated to chair with no dizziness.  Sitting up in chair now.

## 2018-10-09 DIAGNOSIS — R748 Abnormal levels of other serum enzymes: Secondary | ICD-10-CM | POA: Diagnosis not present

## 2018-10-09 DIAGNOSIS — R55 Syncope and collapse: Secondary | ICD-10-CM | POA: Diagnosis not present

## 2018-10-09 DIAGNOSIS — D696 Thrombocytopenia, unspecified: Secondary | ICD-10-CM | POA: Diagnosis not present

## 2018-10-09 DIAGNOSIS — I951 Orthostatic hypotension: Secondary | ICD-10-CM | POA: Diagnosis not present

## 2018-10-09 LAB — COMPREHENSIVE METABOLIC PANEL
ALT: 140 U/L — ABNORMAL HIGH (ref 0–44)
AST: 265 U/L — ABNORMAL HIGH (ref 15–41)
Albumin: 2.8 g/dL — ABNORMAL LOW (ref 3.5–5.0)
Alkaline Phosphatase: 132 U/L — ABNORMAL HIGH (ref 38–126)
Anion gap: 9 (ref 5–15)
BUN: <5 mg/dL — ABNORMAL LOW (ref 6–20)
CO2: 25 mmol/L (ref 22–32)
Calcium: 8.5 mg/dL — ABNORMAL LOW (ref 8.9–10.3)
Chloride: 103 mmol/L (ref 98–111)
Creatinine, Ser: 0.56 mg/dL — ABNORMAL LOW (ref 0.61–1.24)
GFR calc Af Amer: >60 mL/min (ref >60)
GFR calc non Af Amer: >60 mL/min (ref >60)
Glucose, Bld: 94 mg/dL (ref 70–99)
Potassium: 3.4 mmol/L — ABNORMAL LOW (ref 3.5–5.1)
Sodium: 137 mmol/L (ref 135–145)
Total Bilirubin: 3 mg/dL — ABNORMAL HIGH (ref 0.3–1.2)
Total Protein: 5.8 g/dL — ABNORMAL LOW (ref 6.5–8.1)

## 2018-10-09 LAB — GLUCOSE, CAPILLARY
Glucose-Capillary: 127 mg/dL — ABNORMAL HIGH (ref 70–99)
Glucose-Capillary: 113 mg/dL — ABNORMAL HIGH (ref 70–99)

## 2018-10-09 LAB — HIV ANTIBODY (ROUTINE TESTING W REFLEX): HIV Screen 4th Generation wRfx: NONREACTIVE

## 2018-10-09 MED ORDER — FOLIC ACID 1 MG PO TABS: 1.0000 mg | ORAL_TABLET | Freq: Every day | ORAL | 1 refills | Status: DC

## 2018-10-09 NOTE — Discharge Summary (Signed)
Physician Discharge Summary  Eric Dennis IFO:[id number] DOB: Jul 23, 1965 DOA: 10/07/2018  PCP: Alanson Puls [facility]  Admit date: 10/07/2018 Discharge date: 10/09/2018  Admitted From: Home Disposition:  Home  Recommendations for Outpatient Follow-up:  1. Follow up with PCP in 1-2 weeks 2. Please obtain CMP/CBC in one week     Discharge Condition: Stable CODE STATUS:*FULL Diet recommendation: Heart Healthy   Brief/Interim Summary: 53 year old male with a history of alcohol dependence and no other documented chronic medical problems presenting with syncope on the afternoon of 10/07/2018.  The patient states that he was sitting in his chair for haircut and had chest finished getting a shave with his head tilted backward.  When he got up, the patient felt lightheaded which resulted in a syncopal episode.  Bystanders noted that the patient was unconscious for approximately 30 to 60 seconds.  There was no tonic-clonic activity or tongue biting or incontinence.  The patient was alert and oriented when he woke up.  The patient states that he has had poor oral intake for several months.  In addition, he has had symptoms of dizziness when he first gets up from a recumbent position.  He denies any recent fevers, chills, chest discomfort, shortness breath, coughing, hemoptysis, nausea, vomiting, diarrhea, abdominal pain, dysuria.  Upon presentation, the patient was noted to be orthostatic.  He was given 2 L of normal saline in the emergency department.  He remained orthostatic.  As result, the patient was admitted for further evaluation.  Unfortunately, the patient continues to drink at least 2 beers on a daily basis.  He states that he drinks wine 3-4 times per week.  He has been drinking regularly for 20 years.  Discharge Diagnoses:  Syncope -Secondary to orthostatic hypotension -Echocardiogram--EF 50-55%, impaired relaxation, no major valvular abnormalities -Personally reviewed telemetry--no  concerning dysrhythmias -Personally reviewed EKG--sinus rhythm, no ST-T wave changes; old Q waves noted -10/08/2018 repeat orthostatics--remain positive -Continued IV fluids -Given the patient's history of syncope with his head tilted back--obtain ultrasound to vertebrobasilar system--negative -10/09/18--repeat orthostatics negative  Orthostatic hypotension -10/08/2018 repeat orthostatics remain positive -Patient may have a component of dysautonomia secondary to his chronic alcohol use -Continue IV fluids -10/09/18--repeat orthostatics negative  Alcohol dependence -Alcohol withdrawal protocol -no signs of withdrawal  Folic acid deficiency -Started daily folic acid  Thrombocytopenia -Secondary to chronic alcohol use -Suspect underlying alcoholic liver disease -folate = 2.3 -B12--1522 -TSH 3.978  Hypomagnesemia -Repleted  Transaminasemia -due to Etoh -check Hep B surface antigen--pending at time of d/c -check Hep C antibody--pending at time of d/c -RUQ ultrasound--GB sludge without cholecystitis; no CBD dilatation -am LFTs--trending down -will need outpatient follow up  Tobacco abuse -I have discussed tobacco cessation with the patient.  I have counseled the patient regarding the negative impacts of continued tobacco use including but not limited to lung cancer, COPD, and cardiovascular disease.  I have discussed alternatives to tobacco and modalities that may help facilitate tobacco cessation including but not limited to biofeedback, hypnosis, and medications.  Total time spent with tobacco counseling was 4 minutes.    Discharge Instructions   Allergies as of 10/09/2018   No Known Allergies     Medication List    TAKE these medications   folic acid 1 MG tablet Commonly known as: FOLVITE Take 1 tablet (1 mg total) by mouth daily. Start taking on: October 10, 2018   ibuprofen 400 MG tablet Commonly known as: ADVIL Take 400 mg by mouth every 6 (six) hours as  needed.       No Known Allergies  Consultations:  none   Procedures/Studies: US Carotid Bilateral  Result Date: 10/08/2018 CLINICAL DATA:  Syncopal episode, smoker EXAM: BILATERAL CAROTID DUPLEX ULTRASOUND TECHNIQUE: Pearline Cables scale imaging, color Doppler and duplex ultrasound were performed of bilateral carotid and vertebral arteries in the neck. COMPARISON:  None. FINDINGS: Criteria: Quantification of carotid stenosis is based on velocity parameters that correlate the residual internal carotid diameter with NASCET-based stenosis levels, using the diameter of the distal internal carotid lumen as the denominator for stenosis measurement. The following velocity measurements were obtained: RIGHT ICA: 88/25 cm/sec CCA: 62/70 cm/sec SYSTOLIC ICA/CCA RATIO:  1.1 ECA: 58 cm/sec LEFT ICA: 62/20 cm/sec CCA: 35/00 cm/sec SYSTOLIC ICA/CCA RATIO:  0.7 ECA: 59 cm/sec RIGHT CAROTID ARTERY: Minor echogenic shadowing plaque formation. No hemodynamically significant right ICA stenosis, velocity elevation, or turbulent flow. Degree of narrowing less than 50%. RIGHT VERTEBRAL ARTERY:  Antegrade LEFT CAROTID ARTERY: Similar scattered minor echogenic plaque formation. No hemodynamically significant left ICA stenosis, velocity elevation, or turbulent flow. LEFT VERTEBRAL ARTERY:  Antegrade IMPRESSION: Minor carotid atherosclerosis. No hemodynamically significant ICA stenosis. Degree of narrowing less than 50% bilaterally by ultrasound criteria. Patent antegrade vertebral flow bilaterally Electronically Signed   By: Jerilynn Mages.  Shick M.D.   On: 10/08/2018 09:50   Dg Chest Portable 1 View  Result Date: 10/07/2018 CLINICAL DATA:  Syncope EXAM: PORTABLE CHEST 1 VIEW COMPARISON:  December 20, 2013 FINDINGS: Lungs are clear. Heart size and pulmonary vascularity are normal. No adenopathy. No bone lesions. No pneumothorax. IMPRESSION: No edema or consolidation. Electronically Signed   By: Lowella Grip III M.D.   On: 10/07/2018 17:38    US Abdomen Limited Ruq  Result Date: 10/08/2018 CLINICAL DATA:  Elevated LFTs EXAM: ULTRASOUND ABDOMEN LIMITED RIGHT UPPER QUADRANT COMPARISON:  02/06/2014 FINDINGS: Gallbladder: Minor layering hypoechoic gallbladder sludge. Negative for gallstones, wall thickening or a Murphy's sign. No pericholecystic fluid or signs of cholecystitis Common bile duct: Diameter: 3.6 mm Liver: No focal lesion identified. Within normal limits in parenchymal echogenicity. Portal vein is patent on color Doppler imaging with normal direction of blood flow towards the liver. Other: No free fluid or ascites IMPRESSION: Minor gallbladder sludge noted. No other acute finding by ultrasound Electronically Signed   By: Jerilynn Mages.  Shick M.D.   On: 10/08/2018 09:52         Discharge Exam: Vitals:   10/09/18 0600 10/09/18 0941  BP: 105/70 113/83  Pulse: 74 61  Resp: 18 18  Temp: 98 F (36.7 C)   SpO2: 100% 100%   Vitals:   10/09/18 0014 10/09/18 0500 10/09/18 0600 10/09/18 0941  BP: 117/88  105/70 113/83  Pulse: 73  74 61  Resp: 19  18 18   Temp: 98.9 F (37.2 C)  98 F (36.7 C)   TempSrc: Oral  Oral   SpO2: 100%  100% 100%  Weight:  65.1 kg    Height:        General: Pt is alert, awake, not in acute distress Cardiovascular: RRR, S1/S2 +, no rubs, no gallops Respiratory: CTA bilaterally, no wheezing, no rhonchi Abdominal: Soft, NT, ND, bowel sounds + Extremities: no edema, no cyanosis   The results of significant diagnostics from this hospitalization (including imaging, microbiology, ancillary and laboratory) are listed below for reference.    Significant Diagnostic Studies: US Carotid Bilateral  Result Date: 10/08/2018 CLINICAL DATA:  Syncopal episode, smoker EXAM: BILATERAL CAROTID DUPLEX ULTRASOUND TECHNIQUE: Pearline Cables scale imaging, color Doppler and  duplex ultrasound were performed of bilateral carotid and vertebral arteries in the neck. COMPARISON:  None. FINDINGS: Criteria: Quantification of carotid  stenosis is based on velocity parameters that correlate the residual internal carotid diameter with NASCET-based stenosis levels, using the diameter of the distal internal carotid lumen as the denominator for stenosis measurement. The following velocity measurements were obtained: RIGHT ICA: 88/25 cm/sec CCA: 89/38 cm/sec SYSTOLIC ICA/CCA RATIO:  1.1 ECA: 58 cm/sec LEFT ICA: 62/20 cm/sec CCA: 10/17 cm/sec SYSTOLIC ICA/CCA RATIO:  0.7 ECA: 59 cm/sec RIGHT CAROTID ARTERY: Minor echogenic shadowing plaque formation. No hemodynamically significant right ICA stenosis, velocity elevation, or turbulent flow. Degree of narrowing less than 50%. RIGHT VERTEBRAL ARTERY:  Antegrade LEFT CAROTID ARTERY: Similar scattered minor echogenic plaque formation. No hemodynamically significant left ICA stenosis, velocity elevation, or turbulent flow. LEFT VERTEBRAL ARTERY:  Antegrade IMPRESSION: Minor carotid atherosclerosis. No hemodynamically significant ICA stenosis. Degree of narrowing less than 50% bilaterally by ultrasound criteria. Patent antegrade vertebral flow bilaterally Electronically Signed   By: Jerilynn Mages.  Shick M.D.   On: 10/08/2018 09:50   Dg Chest Portable 1 View  Result Date: 10/07/2018 CLINICAL DATA:  Syncope EXAM: PORTABLE CHEST 1 VIEW COMPARISON:  December 20, 2013 FINDINGS: Lungs are clear. Heart size and pulmonary vascularity are normal. No adenopathy. No bone lesions. No pneumothorax. IMPRESSION: No edema or consolidation. Electronically Signed   By: Lowella Grip III M.D.   On: 10/07/2018 17:38   US Abdomen Limited Ruq  Result Date: 10/08/2018 CLINICAL DATA:  Elevated LFTs EXAM: ULTRASOUND ABDOMEN LIMITED RIGHT UPPER QUADRANT COMPARISON:  02/06/2014 FINDINGS: Gallbladder: Minor layering hypoechoic gallbladder sludge. Negative for gallstones, wall thickening or a Murphy's sign. No pericholecystic fluid or signs of cholecystitis Common bile duct: Diameter: 3.6 mm Liver: No focal lesion identified. Within normal  limits in parenchymal echogenicity. Portal vein is patent on color Doppler imaging with normal direction of blood flow towards the liver. Other: No free fluid or ascites IMPRESSION: Minor gallbladder sludge noted. No other acute finding by ultrasound Electronically Signed   By: Jerilynn Mages.  Shick M.D.   On: 10/08/2018 09:52     Microbiology: Recent Results (from the past 240 hour(s))  Novel Coronavirus, NAA ([facility])     Status: None   Collection Time: 10/04/18  9:48 AM   Specimen: Nasal Swab  Result Value Ref Range Status   SARS-CoV-2, NAA Not Detected Not Detected Final    Comment: This test was developed and its performance characteristics determined by [employer]. This test has not been FDA cleared or approved. This test has been authorized by FDA under an Emergency Use Authorization (EUA). This test is only authorized for the duration of time the declaration that circumstances exist justifying the authorization of the emergency use of in vitro diagnostic tests for detection of SARS-CoV-2 virus and/or diagnosis of COVID-19 infection under section 564(b)(1) of the Act, 21 U.S.C. 510CHE-5(I)(7), unless the authorization is terminated or revoked sooner. When diagnostic testing is negative, the possibility of a false negative result should be considered in the context of a patient's recent exposures and the presence of clinical signs and symptoms consistent with COVID-19. An individual without symptoms of COVID-19 and who is not shedding SARS-CoV-2 virus would expect to have a negative (not detected) result in this assay.   SARS Coronavirus 2 [facility] order, Performed in [facility] hospital lab) Nasopharyngeal Nasopharyngeal Swab     Status: None   Collection Time: 10/07/18  9:52 PM   Specimen: Nasopharyngeal Swab  Result Value  Ref Range Status   SARS Coronavirus 2 NEGATIVE NEGATIVE Final    Comment: (NOTE) If result is NEGATIVE SARS-CoV-2 target nucleic acids are NOT  DETECTED. The SARS-CoV-2 RNA is generally detectable in upper and lower  respiratory specimens during the acute phase of infection. The lowest  concentration of SARS-CoV-2 viral copies this assay can detect is 250  copies / mL. A negative result does not preclude SARS-CoV-2 infection  and should not be used as the sole basis for treatment or other  patient management decisions.  A negative result may occur with  improper specimen collection / handling, submission of specimen other  than nasopharyngeal swab, presence of viral mutation(s) within the  areas targeted by this assay, and inadequate number of viral copies  (<250 copies / mL). A negative result must be combined with clinical  observations, patient history, and epidemiological information. If result is POSITIVE SARS-CoV-2 target nucleic acids are DETECTED. The SARS-CoV-2 RNA is generally detectable in upper and lower  respiratory specimens dur ing the acute phase of infection.  Positive  results are indicative of active infection with SARS-CoV-2.  Clinical  correlation with patient history and other diagnostic information is  necessary to determine patient infection status.  Positive results do  not rule out bacterial infection or co-infection with other viruses. If result is PRESUMPTIVE POSTIVE SARS-CoV-2 nucleic acids MAY BE PRESENT.   A presumptive positive result was obtained on the submitted specimen  and confirmed on repeat testing.  While 2019 novel coronavirus  (SARS-CoV-2) nucleic acids may be present in the submitted sample  additional confirmatory testing may be necessary for epidemiological  and / or clinical management purposes  to differentiate between  SARS-CoV-2 and other Sarbecovirus currently known to infect humans.  If clinically indicated additional testing with an alternate test  methodology [phone]) is advised. The SARS-CoV-2 RNA is generally  detectable in upper and lower respiratory sp ecimens during  the acute  phase of infection. The expected result is Negative. Fact Sheet for Patients:  StrictlyIdeas.no Fact Sheet for Healthcare Providers: BankingDealers.co.za This test is not yet approved or cleared by the [place] FDA and has been authorized for detection and/or diagnosis of SARS-CoV-2 by FDA under an Emergency Use Authorization (EUA).  This EUA will remain in effect (meaning this test can be used) for the duration of the COVID-19 declaration under Section 564(b)(1) of the Act, 21 U.S.C. section 360bbb-3(b)(1), unless the authorization is terminated or revoked sooner. Performed at [facility], [address], [place], [place] [address]      Labs: Basic Metabolic Panel: Recent Labs  Lab 10/07/18 1625 10/08/18 0528 10/09/18 0618  NA 136 137 137  K 3.8 4.2 3.4*  CL 99 101 103  CO2 27 27 25   GLUCOSE 119* 111* 94  BUN 10 8 <5*  CREATININE 0.73 0.72 0.56*  CALCIUM 8.5* 8.3* 8.5*  MG 1.6*  --   --    Liver Function Tests: Recent Labs  Lab 10/08/18 0528 10/09/18 0618  AST 304* 265*  ALT 147* 140*  ALKPHOS 138* 132*  BILITOT 2.7* 3.0*  PROT 5.6* 5.8*  ALBUMIN 2.7* 2.8*   Recent Labs  Lab 10/07/18 1625  LIPASE 42   No results for input(s): AMMONIA in the last 168 hours. CBC: Recent Labs  Lab 10/07/18 1625 10/08/18 0528  WBC 4.5 5.8  HGB 13.6 12.2*  HCT 38.6* 33.8*  MCV 99.2 98.5  PLT 116* 104*   Cardiac Enzymes: No results for input(s): CKTOTAL,  CKMB, CKMBINDEX, TROPONINI in the last 168 hours. BNP: Invalid input(s): POCBNP CBG: Recent Labs  Lab 10/07/18 1618 10/08/18 0722 10/09/18 0748 10/09/18 1108  GLUCAP 132* 97 127* 113*    Time coordinating discharge:  36 minutes  Signed:  Orson Eva, DO [facility] Pager: [phone] 10/09/2018, 11:50 AM

## 2018-10-09 NOTE — Progress Notes (Signed)
Nsg Discharge Note  Admit Date:  10/07/2018 Discharge date: 10/09/2018   DELMAN GOSHORN to be D/C'd Home per MD order.  AVS completed.  Patient able to verbalize understanding.  Discharge Medication: Allergies as of 10/09/2018   No Known Allergies     Medication List    TAKE these medications   folic acid 1 MG tablet Commonly known as: FOLVITE Take 1 tablet (1 mg total) by mouth daily. Start taking on: October 10, 2018   ibuprofen 400 MG tablet Commonly known as: ADVIL Take 400 mg by mouth every 6 (six) hours as needed.       Discharge Assessment: Vitals:   10/09/18 0600 10/09/18 0941  BP: 105/70 113/83  Pulse: 74 61  Resp: 18 18  Temp: 98 F (36.7 C)   SpO2: 100% 100%   Skin clean, dry and intact without evidence of skin break down, no evidence of skin tears noted. IV catheter discontinued intact. Site without signs and symptoms of complications - no redness or edema noted at insertion site, patient denies c/o pain - only slight tenderness at site.  Dressing with slight pressure applied.  D/c Instructions-Education: Discharge instructions given to patient with verbalized understanding. D/c education completed with patient including follow up instructions, medication list, d/c activities limitations if indicated, with other d/c instructions as indicated by MD - patient able to verbalize understanding, all questions fully answered. Patient instructed to return to ED, call 911, or call MD for any changes in condition.  Patient escorted via [place], and D/C home via private auto.  Codey Burling Loletha Grayer, RN 10/09/2018 11:57 AM

## 2018-10-10 LAB — HEPATITIS C ANTIBODY: HCV Ab: <0.1 s/co ratio (ref 0.0–0.9)

## 2018-10-10 LAB — HEPATITIS B SURFACE ANTIGEN: Hepatitis B Surface Ag: NEGATIVE

## 2018-12-28 DIAGNOSIS — E876 Hypokalemia: Secondary | ICD-10-CM | POA: Diagnosis not present

## 2018-12-28 DIAGNOSIS — R252 Cramp and spasm: Secondary | ICD-10-CM | POA: Diagnosis not present

## 2018-12-28 DIAGNOSIS — G621 Alcoholic polyneuropathy: Secondary | ICD-10-CM | POA: Diagnosis not present

## 2018-12-29 DIAGNOSIS — F17210 Nicotine dependence, cigarettes, uncomplicated: Secondary | ICD-10-CM | POA: Diagnosis not present

## 2018-12-29 DIAGNOSIS — Z716 Tobacco abuse counseling: Secondary | ICD-10-CM | POA: Diagnosis not present

## 2019-01-12 DIAGNOSIS — R252 Cramp and spasm: Secondary | ICD-10-CM | POA: Diagnosis not present

## 2019-01-12 DIAGNOSIS — R600 Localized edema: Secondary | ICD-10-CM | POA: Diagnosis not present

## 2019-01-12 DIAGNOSIS — R21 Rash and other nonspecific skin eruption: Secondary | ICD-10-CM | POA: Diagnosis not present

## 2019-01-19 DIAGNOSIS — Z0001 Encounter for general adult medical examination with abnormal findings: Secondary | ICD-10-CM | POA: Diagnosis not present

## 2019-01-19 DIAGNOSIS — R634 Abnormal weight loss: Secondary | ICD-10-CM | POA: Diagnosis not present

## 2019-01-19 DIAGNOSIS — Z125 Encounter for screening for malignant neoplasm of prostate: Secondary | ICD-10-CM | POA: Diagnosis not present

## 2019-01-19 DIAGNOSIS — E876 Hypokalemia: Secondary | ICD-10-CM | POA: Diagnosis not present

## 2019-01-23 DIAGNOSIS — Z7189 Other specified counseling: Secondary | ICD-10-CM | POA: Diagnosis not present

## 2019-01-23 DIAGNOSIS — Z716 Tobacco abuse counseling: Secondary | ICD-10-CM | POA: Diagnosis not present

## 2019-01-23 DIAGNOSIS — F17210 Nicotine dependence, cigarettes, uncomplicated: Secondary | ICD-10-CM | POA: Diagnosis not present

## 2019-01-24 DIAGNOSIS — F1010 Alcohol abuse, uncomplicated: Secondary | ICD-10-CM | POA: Diagnosis not present

## 2019-01-24 DIAGNOSIS — R945 Abnormal results of liver function studies: Secondary | ICD-10-CM | POA: Diagnosis not present

## 2019-01-24 DIAGNOSIS — D649 Anemia, unspecified: Secondary | ICD-10-CM | POA: Diagnosis not present

## 2019-02-14 ENCOUNTER — Inpatient Hospital Stay (HOSPITAL_COMMUNITY): Payer: BC Managed Care – PPO | Admitting: Hematology

## 2019-02-14 DIAGNOSIS — F1010 Alcohol abuse, uncomplicated: Secondary | ICD-10-CM | POA: Diagnosis not present

## 2019-02-14 DIAGNOSIS — R748 Abnormal levels of other serum enzymes: Secondary | ICD-10-CM | POA: Diagnosis not present

## 2019-02-14 DIAGNOSIS — R634 Abnormal weight loss: Secondary | ICD-10-CM | POA: Diagnosis not present

## 2019-02-14 DIAGNOSIS — D509 Iron deficiency anemia, unspecified: Secondary | ICD-10-CM | POA: Diagnosis not present

## 2019-02-14 DIAGNOSIS — Z1211 Encounter for screening for malignant neoplasm of colon: Secondary | ICD-10-CM | POA: Diagnosis not present

## 2019-02-16 ENCOUNTER — Encounter (HOSPITAL_COMMUNITY): Payer: Self-pay | Admitting: Surgery

## 2019-02-16 ENCOUNTER — Encounter (HOSPITAL_COMMUNITY): Payer: Self-pay | Admitting: Hematology

## 2019-02-16 ENCOUNTER — Other Ambulatory Visit: Payer: Self-pay

## 2019-02-16 ENCOUNTER — Inpatient Hospital Stay (HOSPITAL_COMMUNITY): Payer: BC Managed Care – PPO

## 2019-02-16 ENCOUNTER — Inpatient Hospital Stay (HOSPITAL_COMMUNITY): Payer: BC Managed Care – PPO | Attending: Hematology | Admitting: Hematology

## 2019-02-16 VITALS — BP 106/72 | HR 87 | Temp 98.2°F | Resp 16 | Ht 75.0 in | Wt 162.2 lb

## 2019-02-16 DIAGNOSIS — R748 Abnormal levels of other serum enzymes: Secondary | ICD-10-CM | POA: Diagnosis not present

## 2019-02-16 DIAGNOSIS — F17210 Nicotine dependence, cigarettes, uncomplicated: Secondary | ICD-10-CM | POA: Diagnosis not present

## 2019-02-16 DIAGNOSIS — Z800 Family history of malignant neoplasm of digestive organs: Secondary | ICD-10-CM | POA: Diagnosis not present

## 2019-02-16 DIAGNOSIS — R591 Generalized enlarged lymph nodes: Secondary | ICD-10-CM | POA: Diagnosis not present

## 2019-02-16 LAB — HEPATIC FUNCTION PANEL
ALT: 34 U/L (ref 0–44)
AST: 57 U/L — ABNORMAL HIGH (ref 15–41)
Albumin: 3.1 g/dL — ABNORMAL LOW (ref 3.5–5.0)
Alkaline Phosphatase: 91 U/L (ref 38–126)
Bilirubin, Direct: 0.3 mg/dL — ABNORMAL HIGH (ref 0.0–0.2)
Indirect Bilirubin: 0.4 mg/dL (ref 0.3–0.9)
Total Bilirubin: 0.7 mg/dL (ref 0.3–1.2)
Total Protein: 7.5 g/dL (ref 6.5–8.1)

## 2019-02-16 LAB — LACTATE DEHYDROGENASE: LDH: 168 U/L (ref 98–192)

## 2019-02-16 NOTE — Patient Instructions (Addendum)
[place] at [facility] Discharge Instructions  You were seen today by Dr. Delton Coombes. He reviewed your history, family history and how you've been feeling since seeing your primary care. He will have blood work drawn today. He will set up a telephone visit for follow up.   Thank you for choosing [place] at [facility] to provide your oncology and hematology care.  To afford each patient quality time with our provider, please arrive at least 15 minutes before your scheduled appointment time.   If you have a lab appointment with the [facility] please come in thru the  Main Entrance and check in at the main information desk  You need to re-schedule your appointment should you arrive 10 or more minutes late.  We strive to give you quality time with our providers, and arriving late affects you and other patients whose appointments are after yours.  Also, if you no show three or more times for appointments you may be dismissed from the clinic at the providers discretion.     Again, thank you for choosing [facility].  Our hope is that these requests will decrease the amount of time that you wait before being seen by our physicians.       _____________________________________________________________  Should you have questions after your visit to [facility], please contact our office at [phone] between the hours of 8:00 a.m. and 4:30 p.m.  Voicemails left after 4:00 p.m. will not be returned until the following business day.  For prescription refill requests, have your pharmacy contact our office and allow 72 hours.    Cancer Center Support Programs:   > Cancer Support Group  2nd Tuesday of the month 1pm-2pm, Journey Room

## 2019-02-16 NOTE — Assessment & Plan Note (Signed)
1.  Submental and left axillary adenopathy: -Patient was referred to [place] for evaluation of lymphadenopathy. -He denies any fevers or night sweats.  He has weight loss, 30 pounds in the last 3 months and has gained back 20 pounds. -He reports that he has gained his appetite back. -He smokes 1 pack every 4 days for the past 30 years.  He also drinks about 2 beers per day for the past 35 years. -Oropharyngeal exam shows benign looking polypoid lesion in the floor of the mouth.  Poor dentition present in the lower jaw. -No clear palpable adenopathy in the neck or submental region.  No adenopathy in the left axillary region.  There is a small subcutaneous nodule in the left anterior axillary line which is not a lymph node. -I have checked his LDH level today which is within normal limits. -I plan to recheck him in the next few months to look for any evolving adenopathy. -I have recommended him to see a dentist.

## 2019-02-16 NOTE — Progress Notes (Signed)
[facility] NOTE  Patient Care Team: [facility] as PCP - General  CHIEF COMPLAINTS/PURPOSE OF CONSULTATION:  Possible submental and left axillary lymphadenopathy.  HISTORY OF PRESENTING ILLNESS:  Eric Dennis 53 y.o. male is seen in consultation today for further work-up and management of submental and axillary adenopathy.  This patient was recently hospitalized for syncopal episode and discharged on 10/09/2018.  He reported weight loss of 30 pounds in the last 3 months.  His appetite apparently improved and he gained better close to 20 pounds.  He denies any tingling or numbness in the extremities.  No fevers or night sweats.  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  He smokes 1 pack/ every 4 days for the past 30 years.  He also drinks about 2 beers per day for the past 35 years.  Family history significant for maternal uncle with "stomach cancer".  He is accompanied by his wife today.  He denies any nausea, vomiting, diarrhea or constipation.  No change in bowel habits.  He was recently found to have elevated liver enzymes for which he is also seeing a GI specialist.  Appetite and energy levels are reported as 100%.  No pain is reported.  He reports some cramps in the hands.  MEDICAL HISTORY:  Past Medical History:  Diagnosis Date  . Elevated liver function tests   . High cholesterol   . Weight loss     SURGICAL HISTORY: Past Surgical History:  Procedure Laterality Date  . COLONOSCOPY N/A 03/14/2014   Procedure: COLONOSCOPY;  Surgeon: Rogene Houston, MD;  Location: [facility];  Service: Endoscopy;  Laterality: N/A;  730 - moved to 1/6 @ 12:00 - Ann notified pt  . NO PAST SURGERIES      SOCIAL HISTORY: Social History   Socioeconomic History  . Marital status: Divorced    Spouse name: Not on file  . Number of children: 1  . Years of education: Not on file  . Highest education level: Not on file  Occupational History    Employer: [employer]   Tobacco Use  . Smoking status: Current Every Day Smoker    Packs/day: 0.25    Years: 20.00    Pack years: 5.00    Types: Cigarettes  . Smokeless tobacco: Never Used  . Tobacco comment: 1 pack every 4 days x 20 yrs  Substance and Sexual Activity  . Alcohol use: Yes    Alcohol/week: 14.0 standard drinks    Types: 14 Cans of beer per week    Comment: 1-2 beers a day.  . Drug use: No  . Sexual activity: Yes  Other Topics Concern  . Not on file  Social History Narrative  . Not on file   Social Determinants of Health   Financial Resource Strain:   . Difficulty of Paying Living Expenses: Not on file  Food Insecurity:   . Worried About Charity fundraiser in the Last Year: Not on file  . Ran Out of Food in the Last Year: Not on file  Transportation Needs:   . Lack of Transportation (Medical): Not on file  . Lack of Transportation (Non-Medical): Not on file  Physical Activity:   . Days of Exercise per Week: Not on file  . Minutes of Exercise per Session: Not on file  Stress:   . Feeling of Stress : Not on file  Social Connections:   . Frequency of Communication with Friends and Family: Not on  file  . Frequency of Social Gatherings with Friends and Family: Not on file  . Attends Religious Services: Not on file  . Active Member of Clubs or Organizations: Not on file  . Attends Archivist Meetings: Not on file  . Marital Status: Not on file  Intimate Partner Violence:   . Fear of Current or Ex-Partner: Not on file  . Emotionally Abused: Not on file  . Physically Abused: Not on file  . Sexually Abused: Not on file    FAMILY HISTORY: Family History  Problem Relation Age of Onset  . Diabetes Mother   . Heart disease Maternal Grandmother     ALLERGIES:  has No Known Allergies.  MEDICATIONS:  Current Outpatient Medications  Medication Sig Dispense Refill  . folic acid (FOLVITE) 1 MG tablet Take 1 tablet (1 mg total) by mouth daily. 30 tablet 1  . furosemide  (LASIX) 20 MG tablet Take 20 mg by mouth daily.    Marland Kitchen ibuprofen (ADVIL) 400 MG tablet Take 400 mg by mouth every 6 (six) hours as needed.    . potassium chloride (KLOR-CON) 10 MEQ tablet Take 10 mEq by mouth daily.    Marland Kitchen tiZANidine (ZANAFLEX) 4 MG tablet Take 4 mg by mouth 3 (three) times daily as needed.    . Vitamin D, Ergocalciferol, (DRISDOL) 1.25 MG (50000 UT) CAPS capsule Take 50,000 Units by mouth once a week.     No current facility-administered medications for this visit.    REVIEW OF SYSTEMS:   Constitutional: Denies fevers, chills or abnormal night sweats Eyes: Denies blurriness of vision, double vision or watery eyes Ears, nose, mouth, throat, and face: Denies mucositis or sore throat Respiratory: Denies cough, dyspnea or wheezes Cardiovascular: Denies palpitation, chest discomfort or lower extremity swelling Gastrointestinal:  Denies nausea, heartburn or change in bowel habits Skin: Denies abnormal skin rashes Lymphatics: Denies new lymphadenopathy or easy bruising Neurological:Denies numbness, tingling or new weaknesses Behavioral/Psych: Mood is stable, no new changes  All other systems were reviewed with the patient and are negative.  PHYSICAL EXAMINATION: ECOG PERFORMANCE STATUS: 0 - Asymptomatic  Vitals:   02/16/19 1404  BP: 106/72  Pulse: 87  Resp: 16  Temp: 98.2 F (36.8 C)  SpO2: 100%   Filed Weights   02/16/19 1404  Weight: 162 lb 3.2 oz (73.6 kg)    GENERAL:alert, no distress and comfortable SKIN: skin color, texture, turgor are normal, no rashes or significant lesions EYES: normal, conjunctiva are pink and non-injected, sclera clear OROPHARYNX: Poor dentition particularly in the lower jaw.  There is a polypoid lesion in the floor of the mouth.  No palpable submental adenopathy. NECK: supple, thyroid normal size, non-tender, without nodularity LYMPH:  no palpable lymphadenopathy in the cervical, axillary or inguinal LUNGS: clear to auscultation and  percussion with normal breathing effort HEART: regular rate & rhythm and no murmurs and no lower extremity edema ABDOMEN:abdomen soft, non-tender and normal bowel sounds Musculoskeletal:no cyanosis of digits and no clubbing  PSYCH: alert & oriented x 3 with fluent speech NEURO: no focal motor/sensory deficits No palpable lymphadenopathy in particular attention to the left axillary region.  There is a small skin nodule in the left anterior axillary line.  LABORATORY DATA:  I have reviewed the data as listed Lab Results  Component Value Date   WBC 5.8 10/08/2018   HGB 12.2 (L) 10/08/2018   HCT 33.8 (L) 10/08/2018   MCV 98.5 10/08/2018   PLT 104 (L) 10/08/2018  Chemistry      Component Value Date/Time   NA 137 10/09/2018 0618   K 3.4 (L) 10/09/2018 0618   CL 103 10/09/2018 0618   CO2 25 10/09/2018 0618   BUN <5 (L) 10/09/2018 0618   CREATININE 0.56 (L) 10/09/2018 0618      Component Value Date/Time   CALCIUM 8.5 (L) 10/09/2018 0618   ALKPHOS 91 02/16/2019 1457   AST 57 (H) 02/16/2019 1457   ALT 34 02/16/2019 1457   BILITOT 0.7 02/16/2019 1457       RADIOGRAPHIC STUDIES: I have personally reviewed the radiological images as listed and agreed with the findings in the report.  ASSESSMENT & PLAN:  Lymphadenopathy 1.  Submental and left axillary adenopathy: -Patient was referred to [place] for evaluation of lymphadenopathy. -He denies any fevers or night sweats.  He has weight loss, 30 pounds in the last 3 months and has gained back 20 pounds. -He reports that he has gained his appetite back. -He smokes 1 pack every 4 days for the past 30 years.  He also drinks about 2 beers per day for the past 35 years. -Oropharyngeal exam shows benign looking polypoid lesion in the floor of the mouth.  Poor dentition present in the lower jaw. -No clear palpable adenopathy in the neck or submental region.  No adenopathy in the left axillary region.  There is a small subcutaneous nodule in the  left anterior axillary line which is not a lymph node. -I have checked his LDH level today which is within normal limits. -I plan to recheck him in the next few months to look for any evolving adenopathy. -I have recommended him to see a dentist.  Orders Placed This Encounter  Procedures  . Lactate dehydrogenase    Standing Status:   Future    Number of Occurrences:   1    Standing Expiration Date:   02/16/2020  . Hepatic function panel    Standing Status:   Future    Number of Occurrences:   1    Standing Expiration Date:   02/16/2020    All questions were answered. The patient knows to call the clinic with any problems, questions or concerns.      Derek Jack, MD 02/16/2019 6:24 PM

## 2019-02-22 ENCOUNTER — Other Ambulatory Visit: Payer: Self-pay

## 2019-02-22 ENCOUNTER — Inpatient Hospital Stay (HOSPITAL_BASED_OUTPATIENT_CLINIC_OR_DEPARTMENT_OTHER): Payer: BC Managed Care – PPO | Admitting: Hematology

## 2019-02-22 ENCOUNTER — Encounter (HOSPITAL_COMMUNITY): Payer: Self-pay | Admitting: Hematology

## 2019-02-22 DIAGNOSIS — R591 Generalized enlarged lymph nodes: Secondary | ICD-10-CM

## 2019-02-22 NOTE — Progress Notes (Signed)
Virtual Visit via Telephone Note  I connected with Eric Dennis on 02/22/19 at  4:20 PM EST by telephone and verified that I am speaking with the correct person using two identifiers.   I discussed the limitations, risks, security and privacy concerns of performing an evaluation and management service by telephone and the availability of in person appointments. I also discussed with the patient that there may be a patient responsible charge related to this service. The patient expressed understanding and agreed to proceed.   History of Present Illness: He was seen in our clinic on 02/16/2019 for possible submental and left axillary adenopathy.  He denies any fevers or night sweats.  He had weight loss 30 pounds in the last 3 months and gained back about 20 pounds.  He is continuing to gain weight.  He has a history of smoking 1 pack of cigarettes every 4 days for the past 30 years.  He also drinks about 2 beers per day for the past 35 years.   Observations/Objective: He denies any weight loss in the last 1 week.  Denies any fevers or night sweats.  No infections reported.  Appetite and energy levels 100%.  No pain reported.  Assessment and Plan:  1.  Submental and left axillary adenopathy: -Physical exam last week did not reveal any palpable adenopathy in the submental region or left axillary region.  There is small nodule, subcutaneous in the left anterior axillary line. -I have reviewed blood work which showed a normal LDH level.  LFTs show elevated AST of 57, but much improved from August. -I have asked him to see a dentist for the floor of the mouth polypoid lesion which appears benign.  He is planning to see one. -I will reevaluate him in 3 months to see if he is developing any adenopathy and also follow-up on the floor of the mouth lesion.  We will do blood work at that time if he needs it.   Follow Up Instructions: RTC 3 months.   I discussed the assessment and treatment plan with  the patient. The patient was provided an opportunity to ask questions and all were answered. The patient agreed with the plan and demonstrated an understanding of the instructions.   The patient was advised to call back or seek an in-person evaluation if the symptoms worsen or if the condition fails to improve as anticipated.  I provided 11 minutes of non-face-to-face time during this encounter.   Derek Jack, MD

## 2019-05-24 ENCOUNTER — Inpatient Hospital Stay (HOSPITAL_COMMUNITY): Payer: BC Managed Care – PPO | Attending: Hematology | Admitting: Hematology

## 2019-07-12 ENCOUNTER — Emergency Department (HOSPITAL_COMMUNITY)
Admission: EM | Admit: 2019-07-12 | Discharge: 2019-07-12 | Disposition: A | Discharge status: Home / Self Care | Payer: BC Managed Care – PPO | Attending: Emergency Medicine | Admitting: Emergency Medicine

## 2019-07-12 ENCOUNTER — Other Ambulatory Visit: Payer: Self-pay

## 2019-07-12 ENCOUNTER — Encounter (HOSPITAL_COMMUNITY): Payer: Self-pay | Admitting: Emergency Medicine

## 2019-07-12 DIAGNOSIS — R1031 Right lower quadrant pain: Secondary | ICD-10-CM | POA: Insufficient documentation

## 2019-07-12 DIAGNOSIS — F17210 Nicotine dependence, cigarettes, uncomplicated: Secondary | ICD-10-CM | POA: Insufficient documentation

## 2019-07-12 DIAGNOSIS — Z79899 Other long term (current) drug therapy: Secondary | ICD-10-CM | POA: Diagnosis not present

## 2019-07-12 NOTE — ED Triage Notes (Signed)
Pt c/o right groin pain for a while that got worse tonight.

## 2019-07-12 NOTE — Discharge Instructions (Signed)
Take Motrin and/or Tylenol as needed for pain.  Rest the area.  Schedule follow-up with Dr. Constance Haw to determine if you do have a hernia.

## 2019-07-12 NOTE — ED Provider Notes (Signed)
[facility] EMERGENCY DEPARTMENT Provider Note   CSN: [id number] Arrival date & time: 07/12/19  0134     History Chief Complaint  Patient presents with  . Groin Pain    Eric Dennis is a 54 y.o. male.  Patient presents to the emergency department for evaluation of right groin pain.  Patient reports that he has had this pain for quite some time but it worsened recently.  He reports that he does heavy lifting at work and it mostly hurts when he is at work and active.  No associated nausea, vomiting, diarrhea or constipation.  No testicular pain or swelling.  No urethral discharge or penile pain/lesions.        Past Medical History:  Diagnosis Date  . Elevated liver function tests   . High cholesterol   . Weight loss     Patient Active Problem List   Diagnosis Date Noted  . Lymphadenopathy 02/16/2019  . Orthostatic hypotension 10/08/2018  . Thrombocytopenia ([place]) 10/08/2018  . Transaminasemia   . Syncope 10/07/2018  . Idiopathic hypotension   . Elevated liver enzymes 01/25/2014  . Loss of weight 01/25/2014  . TOBACCO ABUSE 08/13/2006  . ALLERGIC RHINITIS 08/13/2006    Past Surgical History:  Procedure Laterality Date  . COLONOSCOPY N/A 03/14/2014   Procedure: COLONOSCOPY;  Surgeon: Rogene Houston, MD;  Location: [facility];  Service: Endoscopy;  Laterality: N/A;  730 - moved to 1/6 @ 12:00 - Ann notified pt  . NO PAST SURGERIES         Family History  Problem Relation Age of Onset  . Diabetes Mother   . Heart disease Maternal Grandmother     Social History   Tobacco Use  . Smoking status: Current Every Day Smoker    Packs/day: 0.25    Years: 20.00    Pack years: 5.00    Types: Cigarettes  . Smokeless tobacco: Never Used  . Tobacco comment: 1 pack every 4 days x 20 yrs  Substance Use Topics  . Alcohol use: Yes    Alcohol/week: 14.0 standard drinks    Types: 14 Cans of beer per week    Comment: 1-2 beers a day.  . Drug use: No    Home  Medications Prior to Admission medications   Medication Sig Start Date End Date Taking? Authorizing Provider  folic acid (FOLVITE) 1 MG tablet Take 1 tablet (1 mg total) by mouth daily. Patient taking differently: Take 1 mg by mouth every other day.  10/10/18   Orson Eva, MD  furosemide (LASIX) 20 MG tablet Take 20 mg by mouth as needed.  01/13/19   [provider]  ibuprofen (ADVIL) 400 MG tablet Take 400 mg by mouth every 6 (six) hours as needed.    [provider]  potassium chloride (KLOR-CON) 10 MEQ tablet Take 10 mEq by mouth as needed.  12/29/18   [provider]  tiZANidine (ZANAFLEX) 4 MG tablet Take 4 mg by mouth 3 (three) times daily as needed. 12/29/18   [provider]  Vitamin D, Ergocalciferol, (DRISDOL) 1.25 MG (50000 UT) CAPS capsule Take 50,000 Units by mouth once a week. 01/24/19   [provider]    Allergies    Patient has no known allergies.  Review of Systems   Review of Systems  Gastrointestinal:       Groin pain  All other systems reviewed and are negative.   Physical Exam Updated Vital Signs BP (!) 145/88 (BP Location:  Left Arm)   Pulse 81   Temp 97.9 F (36.6 C) (Oral)   Resp 18   Ht 6\' 4"  (1.93 m)   Wt 78.5 kg   SpO2 97%   BMI 21.06 kg/m   Physical Exam Vitals and nursing note reviewed.  Constitutional:      General: He is not in acute distress.    Appearance: Normal appearance. He is well-developed.  HENT:     Head: Normocephalic and atraumatic.     Right Ear: Hearing normal.     Left Ear: Hearing normal.     Nose: Nose normal.  Eyes:     Conjunctiva/sclera: Conjunctivae normal.     Pupils: Pupils are equal, round, and reactive to light.  Cardiovascular:     Rate and Rhythm: Regular rhythm.     Heart sounds: S1 normal and S2 normal. No murmur. No friction rub. No gallop.   Pulmonary:     Effort: Pulmonary effort is normal. No respiratory distress.     Breath sounds: Normal breath sounds.   Chest:     Chest wall: No tenderness.  Abdominal:     General: Bowel sounds are normal.     Palpations: Abdomen is soft.     Tenderness: There is no abdominal tenderness. There is no guarding or rebound. Negative signs include Murphy's sign and McBurney's sign.     Hernia: No hernia is present.       Comments: Patient has tenderness in the right inguinal region with some fullness but no obvious hernia mass  Musculoskeletal:        General: Normal range of motion.     Cervical back: Normal range of motion and neck supple.  Skin:    General: Skin is warm and dry.     Findings: No rash.  Neurological:     Mental Status: He is alert and oriented to person, place, and time.     GCS: GCS eye subscore is 4. GCS verbal subscore is 5. GCS motor subscore is 6.     Cranial Nerves: No cranial nerve deficit.     Sensory: No sensory deficit.     Coordination: Coordination normal.  Psychiatric:        Speech: Speech normal.        Behavior: Behavior normal.        Thought Content: Thought content normal.     ED Results / Procedures / Treatments   Labs (all labs ordered are listed, but only abnormal results are displayed) Labs Reviewed - No data to display  EKG None  Radiology No results found.  Procedures Procedures (including critical care time)  Medications Ordered in ED Medications - No data to display  ED Course  I have reviewed the triage vital signs and the nursing notes.  Pertinent labs & imaging results that were available during my care of the patient were reviewed by me and considered in my medical decision making (see chart for details).    MDM Rules/Calculators/A&P                      Patient presents with right groin pain.  Pain worsens with movement of the torso and when he does heavy lifting.  It is in the area that I would expect a hernia to her but I do not feel an obvious hernia mass at this time.  He should follow-up with general surgery to determine if  he does have a hernia, otherwise treat as  inguinal strain with rest.  Final Clinical Impression(s) / ED Diagnoses Final diagnoses:  Right inguinal pain    Rx / DC Orders ED Discharge Orders    None       Karmel Patricelli, Gwenyth Allegra, MD 07/12/19 0230

## 2019-08-15 ENCOUNTER — Other Ambulatory Visit (HOSPITAL_COMMUNITY): Payer: Self-pay | Admitting: *Deleted

## 2019-08-15 DIAGNOSIS — R591 Generalized enlarged lymph nodes: Secondary | ICD-10-CM

## 2019-08-16 ENCOUNTER — Inpatient Hospital Stay (HOSPITAL_COMMUNITY): Payer: BC Managed Care – PPO | Attending: Hematology

## 2019-08-23 ENCOUNTER — Ambulatory Visit (HOSPITAL_COMMUNITY): Payer: BC Managed Care – PPO | Admitting: Hematology

## 2020-02-09 DIAGNOSIS — Z13220 Encounter for screening for lipoid disorders: Secondary | ICD-10-CM | POA: Diagnosis not present

## 2020-02-09 DIAGNOSIS — Z0001 Encounter for general adult medical examination with abnormal findings: Secondary | ICD-10-CM | POA: Diagnosis not present

## 2020-02-09 DIAGNOSIS — R946 Abnormal results of thyroid function studies: Secondary | ICD-10-CM | POA: Diagnosis not present

## 2020-02-12 ENCOUNTER — Other Ambulatory Visit: Payer: Self-pay | Admitting: Internal Medicine

## 2020-02-12 DIAGNOSIS — K4090 Unilateral inguinal hernia, without obstruction or gangrene, not specified as recurrent: Secondary | ICD-10-CM

## 2020-02-22 ENCOUNTER — Ambulatory Visit
Admission: RE | Admit: 2020-02-22 | Discharge: 2020-02-22 | Disposition: A | Discharge status: Home / Self Care | Payer: BC Managed Care – PPO | Source: Ambulatory Visit | Attending: Internal Medicine | Admitting: Internal Medicine

## 2020-02-22 DIAGNOSIS — K6389 Other specified diseases of intestine: Secondary | ICD-10-CM | POA: Diagnosis not present

## 2020-02-22 DIAGNOSIS — K4090 Unilateral inguinal hernia, without obstruction or gangrene, not specified as recurrent: Secondary | ICD-10-CM

## 2020-02-22 DIAGNOSIS — K3189 Other diseases of stomach and duodenum: Secondary | ICD-10-CM | POA: Diagnosis not present

## 2020-02-22 DIAGNOSIS — K429 Umbilical hernia without obstruction or gangrene: Secondary | ICD-10-CM | POA: Diagnosis not present

## 2020-02-22 DIAGNOSIS — K7689 Other specified diseases of liver: Secondary | ICD-10-CM | POA: Diagnosis not present

## 2020-02-22 MED ORDER — IOPAMIDOL (ISOVUE-300) INJECTION 61%
100.0000 mL | Freq: Once | INTRAVENOUS | Status: AC | PRN
  Administered 2020-02-22: 100 mL via INTRAVENOUS

## 2020-02-29 DIAGNOSIS — R1031 Right lower quadrant pain: Secondary | ICD-10-CM | POA: Diagnosis not present

## 2020-03-26 DIAGNOSIS — K4030 Unilateral inguinal hernia, with obstruction, without gangrene, not specified as recurrent: Secondary | ICD-10-CM | POA: Diagnosis not present

## 2020-03-26 DIAGNOSIS — K4090 Unilateral inguinal hernia, without obstruction or gangrene, not specified as recurrent: Secondary | ICD-10-CM | POA: Diagnosis not present

## 2020-04-01 DIAGNOSIS — K4090 Unilateral inguinal hernia, without obstruction or gangrene, not specified as recurrent: Secondary | ICD-10-CM | POA: Diagnosis not present

## 2020-04-01 DIAGNOSIS — Z6821 Body mass index (BMI) 21.0-21.9, adult: Secondary | ICD-10-CM | POA: Diagnosis not present

## 2020-04-05 DIAGNOSIS — Z01818 Encounter for other preprocedural examination: Secondary | ICD-10-CM | POA: Diagnosis not present

## 2020-04-08 DIAGNOSIS — Z01812 Encounter for preprocedural laboratory examination: Secondary | ICD-10-CM | POA: Diagnosis not present

## 2020-04-08 DIAGNOSIS — K4090 Unilateral inguinal hernia, without obstruction or gangrene, not specified as recurrent: Secondary | ICD-10-CM | POA: Diagnosis not present

## 2020-04-08 DIAGNOSIS — Z01810 Encounter for preprocedural cardiovascular examination: Secondary | ICD-10-CM | POA: Diagnosis not present

## 2020-04-08 DIAGNOSIS — Z01818 Encounter for other preprocedural examination: Secondary | ICD-10-CM | POA: Diagnosis not present

## 2020-04-08 DIAGNOSIS — R9431 Abnormal electrocardiogram [ECG] [EKG]: Secondary | ICD-10-CM | POA: Diagnosis not present

## 2020-04-09 DIAGNOSIS — K4090 Unilateral inguinal hernia, without obstruction or gangrene, not specified as recurrent: Secondary | ICD-10-CM | POA: Diagnosis not present

## 2020-04-18 ENCOUNTER — Other Ambulatory Visit: Payer: Self-pay | Admitting: Internal Medicine

## 2020-04-18 DIAGNOSIS — I700 Atherosclerosis of aorta: Secondary | ICD-10-CM | POA: Diagnosis not present

## 2020-04-18 DIAGNOSIS — N63 Unspecified lump in unspecified breast: Secondary | ICD-10-CM

## 2020-04-18 DIAGNOSIS — K7031 Alcoholic cirrhosis of liver with ascites: Secondary | ICD-10-CM | POA: Diagnosis not present

## 2020-04-18 DIAGNOSIS — E7800 Pure hypercholesterolemia, unspecified: Secondary | ICD-10-CM | POA: Diagnosis not present

## 2020-04-18 DIAGNOSIS — N643 Galactorrhea not associated with childbirth: Secondary | ICD-10-CM | POA: Diagnosis not present

## 2020-05-06 ENCOUNTER — Other Ambulatory Visit: Payer: Self-pay | Admitting: Internal Medicine

## 2020-05-06 ENCOUNTER — Other Ambulatory Visit: Payer: Self-pay

## 2020-05-06 ENCOUNTER — Ambulatory Visit
Admission: RE | Admit: 2020-05-06 | Discharge: 2020-05-06 | Disposition: A | Discharge status: Home / Self Care | Payer: BC Managed Care – PPO | Source: Ambulatory Visit | Attending: Internal Medicine | Admitting: Internal Medicine

## 2020-05-06 DIAGNOSIS — N63 Unspecified lump in unspecified breast: Secondary | ICD-10-CM

## 2020-05-06 DIAGNOSIS — N6452 Nipple discharge: Secondary | ICD-10-CM | POA: Diagnosis not present

## 2020-05-13 ENCOUNTER — Other Ambulatory Visit: Payer: Self-pay

## 2020-05-13 ENCOUNTER — Ambulatory Visit
Admission: RE | Admit: 2020-05-13 | Discharge: 2020-05-13 | Disposition: A | Discharge status: Home / Self Care | Payer: BC Managed Care – PPO | Source: Ambulatory Visit | Attending: Internal Medicine | Admitting: Internal Medicine

## 2020-05-13 DIAGNOSIS — N63 Unspecified lump in unspecified breast: Secondary | ICD-10-CM

## 2020-05-13 DIAGNOSIS — N6321 Unspecified lump in the left breast, upper outer quadrant: Secondary | ICD-10-CM | POA: Diagnosis not present

## 2020-05-13 DIAGNOSIS — N6012 Diffuse cystic mastopathy of left breast: Secondary | ICD-10-CM | POA: Diagnosis not present

## 2020-05-16 DIAGNOSIS — K7031 Alcoholic cirrhosis of liver with ascites: Secondary | ICD-10-CM | POA: Diagnosis not present

## 2020-05-30 ENCOUNTER — Other Ambulatory Visit: Payer: BC Managed Care – PPO

## 2020-07-09 DIAGNOSIS — K7031 Alcoholic cirrhosis of liver with ascites: Secondary | ICD-10-CM | POA: Diagnosis not present

## 2020-07-09 DIAGNOSIS — M7989 Other specified soft tissue disorders: Secondary | ICD-10-CM | POA: Diagnosis not present

## 2020-07-12 DIAGNOSIS — K7031 Alcoholic cirrhosis of liver with ascites: Secondary | ICD-10-CM | POA: Diagnosis not present

## 2020-07-17 ENCOUNTER — Other Ambulatory Visit (HOSPITAL_COMMUNITY): Payer: Self-pay | Admitting: Gastroenterology

## 2020-07-17 ENCOUNTER — Other Ambulatory Visit: Payer: Self-pay | Admitting: Gastroenterology

## 2020-07-17 DIAGNOSIS — Z7289 Other problems related to lifestyle: Secondary | ICD-10-CM | POA: Diagnosis not present

## 2020-07-17 DIAGNOSIS — K7031 Alcoholic cirrhosis of liver with ascites: Secondary | ICD-10-CM

## 2020-07-19 ENCOUNTER — Other Ambulatory Visit: Payer: Self-pay

## 2020-07-19 ENCOUNTER — Ambulatory Visit (HOSPITAL_COMMUNITY)
Admission: RE | Admit: 2020-07-19 | Discharge: 2020-07-19 | Disposition: A | Discharge status: Home / Self Care | Payer: BC Managed Care – PPO | Source: Ambulatory Visit | Attending: Gastroenterology | Admitting: Gastroenterology

## 2020-07-19 DIAGNOSIS — K7031 Alcoholic cirrhosis of liver with ascites: Secondary | ICD-10-CM | POA: Diagnosis not present

## 2020-07-19 HISTORY — PX: IR PARACENTESIS: IMG2679

## 2020-07-19 LAB — BODY FLUID CELL COUNT WITH DIFFERENTIAL
Eos, Fluid: 0 %
Lymphs, Fluid: 33 %
Monocyte-Macrophage-Serous Fluid: 56 % (ref 50–90)
Neutrophil Count, Fluid: 11 % (ref 0–25)
Total Nucleated Cell Count, Fluid: 233 cu mm (ref 0–1000)

## 2020-07-19 LAB — PROTEIN, PLEURAL OR PERITONEAL FLUID: Total protein, fluid: <3 g/dL

## 2020-07-19 MED ORDER — LIDOCAINE HCL (PF) 1 % IJ SOLN
INTRAMUSCULAR | Status: DC | PRN
  Administered 2020-07-19: 10 mL

## 2020-07-19 MED ORDER — LIDOCAINE HCL 1 % IJ SOLN
INTRAMUSCULAR | Status: AC
  Filled 2020-07-19: qty 20

## 2020-07-19 NOTE — Procedures (Signed)
Ultrasound-guided diagnostic and therapeutic paracentesis performed yielding 450 mili liters of straw colored fluid.  Fluid was sent to lab for analysis. No immediate complications. EBL is none.

## 2020-07-22 LAB — CYTOLOGY - NON PAP

## 2020-07-23 LAB — BODY FLUID CULTURE W GRAM STAIN
Culture: NO GROWTH
Gram Stain: NONE SEEN

## 2020-07-25 ENCOUNTER — Inpatient Hospital Stay: Admission: RE | Admit: 2020-07-25 | Payer: BC Managed Care – PPO | Source: Ambulatory Visit

## 2020-07-29 ENCOUNTER — Other Ambulatory Visit (HOSPITAL_COMMUNITY): Payer: Self-pay | Admitting: Gastroenterology

## 2020-07-29 DIAGNOSIS — K7031 Alcoholic cirrhosis of liver with ascites: Secondary | ICD-10-CM

## 2020-08-08 DIAGNOSIS — K7031 Alcoholic cirrhosis of liver with ascites: Secondary | ICD-10-CM | POA: Diagnosis not present

## 2020-08-08 DIAGNOSIS — Z7289 Other problems related to lifestyle: Secondary | ICD-10-CM | POA: Diagnosis not present

## 2020-08-21 DIAGNOSIS — F1020 Alcohol dependence, uncomplicated: Secondary | ICD-10-CM | POA: Diagnosis not present

## 2020-08-21 DIAGNOSIS — K7031 Alcoholic cirrhosis of liver with ascites: Secondary | ICD-10-CM | POA: Diagnosis not present

## 2020-08-22 ENCOUNTER — Inpatient Hospital Stay (HOSPITAL_COMMUNITY): Payer: BC Managed Care – PPO | Admitting: Anesthesiology

## 2020-08-22 ENCOUNTER — Encounter (HOSPITAL_COMMUNITY): Payer: Self-pay

## 2020-08-22 ENCOUNTER — Other Ambulatory Visit: Payer: Self-pay

## 2020-08-22 ENCOUNTER — Emergency Department (HOSPITAL_COMMUNITY): Payer: BC Managed Care – PPO

## 2020-08-22 ENCOUNTER — Inpatient Hospital Stay (HOSPITAL_COMMUNITY)
Admission: EM | Admit: 2020-08-22 | Discharge: 2020-08-31 | DRG: 981 | Disposition: A | Discharge status: Home / Self Care | Payer: BC Managed Care – PPO | Attending: Family Medicine | Admitting: Family Medicine

## 2020-08-22 DIAGNOSIS — E872 Acidosis: Secondary | ICD-10-CM | POA: Diagnosis not present

## 2020-08-22 DIAGNOSIS — D62 Acute posthemorrhagic anemia: Secondary | ICD-10-CM | POA: Diagnosis not present

## 2020-08-22 DIAGNOSIS — R059 Cough, unspecified: Secondary | ICD-10-CM

## 2020-08-22 DIAGNOSIS — E878 Other disorders of electrolyte and fluid balance, not elsewhere classified: Secondary | ICD-10-CM | POA: Diagnosis present

## 2020-08-22 DIAGNOSIS — K068 Other specified disorders of gingiva and edentulous alveolar ridge: Secondary | ICD-10-CM | POA: Diagnosis present

## 2020-08-22 DIAGNOSIS — D696 Thrombocytopenia, unspecified: Secondary | ICD-10-CM | POA: Diagnosis not present

## 2020-08-22 DIAGNOSIS — J309 Allergic rhinitis, unspecified: Secondary | ICD-10-CM | POA: Diagnosis present

## 2020-08-22 DIAGNOSIS — N179 Acute kidney failure, unspecified: Secondary | ICD-10-CM | POA: Diagnosis not present

## 2020-08-22 DIAGNOSIS — Z79899 Other long term (current) drug therapy: Secondary | ICD-10-CM

## 2020-08-22 DIAGNOSIS — I959 Hypotension, unspecified: Secondary | ICD-10-CM | POA: Diagnosis present

## 2020-08-22 DIAGNOSIS — R7401 Elevation of levels of liver transaminase levels: Secondary | ICD-10-CM | POA: Diagnosis not present

## 2020-08-22 DIAGNOSIS — K921 Melena: Secondary | ICD-10-CM | POA: Diagnosis present

## 2020-08-22 DIAGNOSIS — Z20822 Contact with and (suspected) exposure to covid-19: Secondary | ICD-10-CM | POA: Diagnosis not present

## 2020-08-22 DIAGNOSIS — F17210 Nicotine dependence, cigarettes, uncomplicated: Secondary | ICD-10-CM | POA: Diagnosis present

## 2020-08-22 DIAGNOSIS — J690 Pneumonitis due to inhalation of food and vomit: Secondary | ICD-10-CM

## 2020-08-22 DIAGNOSIS — K7031 Alcoholic cirrhosis of liver with ascites: Secondary | ICD-10-CM | POA: Diagnosis not present

## 2020-08-22 DIAGNOSIS — R0902 Hypoxemia: Secondary | ICD-10-CM | POA: Diagnosis not present

## 2020-08-22 DIAGNOSIS — D689 Coagulation defect, unspecified: Secondary | ICD-10-CM | POA: Diagnosis not present

## 2020-08-22 DIAGNOSIS — R0682 Tachypnea, not elsewhere classified: Secondary | ICD-10-CM

## 2020-08-22 DIAGNOSIS — D7589 Other specified diseases of blood and blood-forming organs: Secondary | ICD-10-CM | POA: Diagnosis present

## 2020-08-22 DIAGNOSIS — R042 Hemoptysis: Secondary | ICD-10-CM | POA: Diagnosis not present

## 2020-08-22 DIAGNOSIS — J9811 Atelectasis: Secondary | ICD-10-CM | POA: Diagnosis not present

## 2020-08-22 DIAGNOSIS — J9601 Acute respiratory failure with hypoxia: Secondary | ICD-10-CM | POA: Diagnosis not present

## 2020-08-22 DIAGNOSIS — R0602 Shortness of breath: Secondary | ICD-10-CM | POA: Diagnosis not present

## 2020-08-22 DIAGNOSIS — K7040 Alcoholic hepatic failure without coma: Secondary | ICD-10-CM | POA: Diagnosis not present

## 2020-08-22 DIAGNOSIS — Z716 Tobacco abuse counseling: Secondary | ICD-10-CM

## 2020-08-22 DIAGNOSIS — J90 Pleural effusion, not elsewhere classified: Secondary | ICD-10-CM | POA: Diagnosis not present

## 2020-08-22 DIAGNOSIS — J698 Pneumonitis due to inhalation of other solids and liquids: Secondary | ICD-10-CM | POA: Diagnosis present

## 2020-08-22 DIAGNOSIS — E876 Hypokalemia: Secondary | ICD-10-CM | POA: Diagnosis present

## 2020-08-22 DIAGNOSIS — R1312 Dysphagia, oropharyngeal phase: Secondary | ICD-10-CM | POA: Diagnosis present

## 2020-08-22 DIAGNOSIS — E871 Hypo-osmolality and hyponatremia: Secondary | ICD-10-CM | POA: Diagnosis present

## 2020-08-22 DIAGNOSIS — R791 Abnormal coagulation profile: Secondary | ICD-10-CM | POA: Diagnosis present

## 2020-08-22 DIAGNOSIS — E785 Hyperlipidemia, unspecified: Secondary | ICD-10-CM | POA: Diagnosis present

## 2020-08-22 DIAGNOSIS — Z7141 Alcohol abuse counseling and surveillance of alcoholic: Secondary | ICD-10-CM

## 2020-08-22 DIAGNOSIS — F17200 Nicotine dependence, unspecified, uncomplicated: Secondary | ICD-10-CM | POA: Diagnosis not present

## 2020-08-22 DIAGNOSIS — K922 Gastrointestinal hemorrhage, unspecified: Secondary | ICD-10-CM | POA: Diagnosis not present

## 2020-08-22 DIAGNOSIS — R9431 Abnormal electrocardiogram [ECG] [EKG]: Secondary | ICD-10-CM | POA: Diagnosis not present

## 2020-08-22 DIAGNOSIS — K089 Disorder of teeth and supporting structures, unspecified: Secondary | ICD-10-CM | POA: Diagnosis not present

## 2020-08-22 DIAGNOSIS — R0600 Dyspnea, unspecified: Secondary | ICD-10-CM | POA: Diagnosis not present

## 2020-08-22 DIAGNOSIS — F1010 Alcohol abuse, uncomplicated: Secondary | ICD-10-CM | POA: Diagnosis not present

## 2020-08-22 DIAGNOSIS — D649 Anemia, unspecified: Secondary | ICD-10-CM

## 2020-08-22 DIAGNOSIS — R0609 Other forms of dyspnea: Secondary | ICD-10-CM | POA: Diagnosis not present

## 2020-08-22 DIAGNOSIS — J189 Pneumonia, unspecified organism: Secondary | ICD-10-CM | POA: Diagnosis not present

## 2020-08-22 LAB — CBC
HCT: 13 % — ABNORMAL LOW (ref 39.0–52.0)
HCT: 11.1 % — ABNORMAL LOW (ref 39.0–52.0)
HCT: 14.7 % — ABNORMAL LOW (ref 39.0–52.0)
Hemoglobin: 4.3 g/dL — CL (ref 13.0–17.0)
Hemoglobin: 4.4 g/dL — CL (ref 13.0–17.0)
Hemoglobin: 5.3 g/dL — CL (ref 13.0–17.0)
MCH: 48.3 pg — ABNORMAL HIGH (ref 26.0–34.0)
MCH: 57.1 pg — ABNORMAL HIGH (ref 26.0–34.0)
MCH: 44.9 pg — ABNORMAL HIGH (ref 26.0–34.0)
MCHC: 33.1 g/dL (ref 30.0–36.0)
MCHC: 39.6 g/dL — ABNORMAL HIGH (ref 30.0–36.0)
MCHC: 36.1 g/dL — ABNORMAL HIGH (ref 30.0–36.0)
MCV: 146.1 fL — ABNORMAL HIGH (ref 80.0–100.0)
MCV: 144.2 fL — ABNORMAL HIGH (ref 80.0–100.0)
MCV: 124.6 fL — ABNORMAL HIGH (ref 80.0–100.0)
Platelets: 88 10*3/uL — ABNORMAL LOW (ref 150–400)
Platelets: 72 10*3/uL — ABNORMAL LOW (ref 150–400)
Platelets: 71 10*3/uL — ABNORMAL LOW (ref 150–400)
RBC: 0.89 MIL/uL — ABNORMAL LOW (ref 4.22–5.81)
RBC: 0.77 MIL/uL — ABNORMAL LOW (ref 4.22–5.81)
RBC: 1.18 MIL/uL — ABNORMAL LOW (ref 4.22–5.81)
RDW: 18.6 % — ABNORMAL HIGH (ref 11.5–15.5)
RDW: 19.4 % — ABNORMAL HIGH (ref 11.5–15.5)
WBC: 11.7 10*3/uL — ABNORMAL HIGH (ref 4.0–10.5)
WBC: 10.9 10*3/uL — ABNORMAL HIGH (ref 4.0–10.5)
WBC: 9 10*3/uL (ref 4.0–10.5)
nRBC: 0 % (ref 0.0–0.2)
nRBC: 0 % (ref 0.0–0.2)
nRBC: 0 % (ref 0.0–0.2)

## 2020-08-22 LAB — CBC WITH DIFFERENTIAL/PLATELET
Abs Immature Granulocytes: 0.15 10*3/uL — ABNORMAL HIGH (ref 0.00–0.07)
Basophils Absolute: 0 10*3/uL (ref 0.0–0.1)
Basophils Relative: 0 %
Eosinophils Absolute: 0.2 10*3/uL (ref 0.0–0.5)
Eosinophils Relative: 2 %
HCT: 11.9 % — ABNORMAL LOW (ref 39.0–52.0)
Hemoglobin: 4.4 g/dL — CL (ref 13.0–17.0)
Immature Granulocytes: 1 %
Lymphocytes Relative: 16 %
Lymphs Abs: 1.9 10*3/uL (ref 0.7–4.0)
MCH: 53 pg — ABNORMAL HIGH (ref 26.0–34.0)
MCHC: 37 g/dL — ABNORMAL HIGH (ref 30.0–36.0)
MCV: 143.4 fL — ABNORMAL HIGH (ref 80.0–100.0)
Monocytes Absolute: 1.7 10*3/uL — ABNORMAL HIGH (ref 0.1–1.0)
Monocytes Relative: 13 %
Neutro Abs: 8.5 10*3/uL — ABNORMAL HIGH (ref 1.7–7.7)
Neutrophils Relative %: 68 %
Platelets: 98 10*3/uL — ABNORMAL LOW (ref 150–400)
RBC: 0.83 MIL/uL — ABNORMAL LOW (ref 4.22–5.81)
RDW: 18.5 % — ABNORMAL HIGH (ref 11.5–15.5)
WBC: 12.5 10*3/uL — ABNORMAL HIGH (ref 4.0–10.5)
nRBC: 0 % (ref 0.0–0.2)

## 2020-08-22 LAB — COMPREHENSIVE METABOLIC PANEL
ALT: 41 U/L (ref 0–44)
AST: 81 U/L — ABNORMAL HIGH (ref 15–41)
Albumin: 1.8 g/dL — ABNORMAL LOW (ref 3.5–5.0)
Alkaline Phosphatase: 94 U/L (ref 38–126)
Anion gap: 14 (ref 5–15)
BUN: 28 mg/dL — ABNORMAL HIGH (ref 6–20)
CO2: 28 mmol/L (ref 22–32)
Calcium: 8.7 mg/dL — ABNORMAL LOW (ref 8.9–10.3)
Chloride: 89 mmol/L — ABNORMAL LOW (ref 98–111)
Creatinine, Ser: 2.06 mg/dL — ABNORMAL HIGH (ref 0.61–1.24)
GFR, Estimated: 37 mL/min — ABNORMAL LOW (ref >60)
Glucose, Bld: 110 mg/dL — ABNORMAL HIGH (ref 70–99)
Potassium: 3.3 mmol/L — ABNORMAL LOW (ref 3.5–5.1)
Sodium: 131 mmol/L — ABNORMAL LOW (ref 135–145)
Total Bilirubin: 13.4 mg/dL — ABNORMAL HIGH (ref 0.3–1.2)
Total Protein: 6.9 g/dL (ref 6.5–8.1)

## 2020-08-22 LAB — RESP PANEL BY RT-PCR (FLU A&B, COVID) ARPGX2
Influenza A by PCR: NEGATIVE
Influenza B by PCR: NEGATIVE
SARS Coronavirus 2 by RT PCR: NEGATIVE

## 2020-08-22 LAB — MAGNESIUM
Magnesium: 1.7 mg/dL (ref 1.7–2.4)
Magnesium: 1.5 mg/dL — ABNORMAL LOW (ref 1.7–2.4)

## 2020-08-22 LAB — PROTIME-INR
INR: 3.1 — ABNORMAL HIGH (ref 0.8–1.2)
Prothrombin Time: 32 seconds — ABNORMAL HIGH (ref 11.4–15.2)

## 2020-08-22 LAB — PHOSPHORUS
Phosphorus: 5.8 mg/dL — ABNORMAL HIGH (ref 2.5–4.6)
Phosphorus: 5.7 mg/dL — ABNORMAL HIGH (ref 2.5–4.6)

## 2020-08-22 LAB — PROCALCITONIN: Procalcitonin: 0.35 ng/mL

## 2020-08-22 LAB — POC OCCULT BLOOD, ED: Fecal Occult Bld: POSITIVE — AB

## 2020-08-22 LAB — APTT: aPTT: 47 seconds — ABNORMAL HIGH (ref 24–36)

## 2020-08-22 LAB — IRON AND TIBC
Iron: 119 ug/dL (ref 45–182)
Saturation Ratios: 100 % — ABNORMAL HIGH (ref 17.9–39.5)
TIBC: 119 ug/dL — ABNORMAL LOW (ref 250–450)

## 2020-08-22 LAB — MRSA PCR SCREENING: MRSA by PCR: NEGATIVE

## 2020-08-22 LAB — HIV ANTIBODY (ROUTINE TESTING W REFLEX): HIV Screen 4th Generation wRfx: NONREACTIVE

## 2020-08-22 LAB — FERRITIN: Ferritin: 3372 ng/mL — ABNORMAL HIGH (ref 24–336)

## 2020-08-22 LAB — LACTIC ACID, PLASMA
Lactic Acid, Venous: 4.5 mmol/L (ref 0.5–1.9)
Lactic Acid, Venous: 4.4 mmol/L (ref 0.5–1.9)

## 2020-08-22 LAB — ABO/RH: ABO/RH(D): A POS

## 2020-08-22 LAB — VITAMIN B12: Vitamin B-12: 2262 pg/mL — ABNORMAL HIGH (ref 180–914)

## 2020-08-22 LAB — PREPARE RBC (CROSSMATCH)

## 2020-08-22 LAB — FOLATE: Folate: 11.3 ng/mL (ref >5.9)

## 2020-08-22 MED ORDER — ADULT MULTIVITAMIN LIQUID CH
15.0000 mL | Freq: Every day | ORAL | Status: DC
  Administered 2020-08-22 – 2020-08-31 (×10): 15 mL via ORAL
  Filled 2020-08-22 (×10): qty 15

## 2020-08-22 MED ORDER — LORAZEPAM 2 MG/ML IJ SOLN
0.0000 mg | Freq: Three times a day (TID) | INTRAMUSCULAR | Status: AC
  Administered 2020-08-25: 1 mg via INTRAVENOUS
  Filled 2020-08-22: qty 1

## 2020-08-22 MED ORDER — ONDANSETRON HCL 4 MG/2ML IJ SOLN
4.0000 mg | Freq: Four times a day (QID) | INTRAMUSCULAR | Status: DC | PRN
  Administered 2020-08-24: 4 mg via INTRAVENOUS
  Filled 2020-08-22: qty 2

## 2020-08-22 MED ORDER — OCTREOTIDE LOAD VIA INFUSION
50.0000 ug | Freq: Once | INTRAVENOUS | Status: AC
  Administered 2020-08-22: 11:00:00 50 ug via INTRAVENOUS
  Filled 2020-08-22: qty 25

## 2020-08-22 MED ORDER — SODIUM CHLORIDE 0.9 % IV SOLN
8.0000 mg/h | INTRAVENOUS | Status: DC
  Administered 2020-08-22: 11:00:00 8 mg/h via INTRAVENOUS
  Filled 2020-08-22: qty 80

## 2020-08-22 MED ORDER — ONDANSETRON HCL 4 MG PO TABS: 4.0000 mg | ORAL_TABLET | Freq: Four times a day (QID) | ORAL | Status: DC | PRN

## 2020-08-22 MED ORDER — LORAZEPAM 2 MG/ML IJ SOLN
1.0000 mg | INTRAMUSCULAR | Status: AC | PRN
  Administered 2020-08-22: 1 mg via INTRAVENOUS
  Filled 2020-08-22: qty 1

## 2020-08-22 MED ORDER — LORAZEPAM 1 MG PO TABS: 1.0000 mg | ORAL_TABLET | ORAL | Status: AC | PRN

## 2020-08-22 MED ORDER — SODIUM CHLORIDE 0.9 % IV SOLN
3.0000 g | Freq: Once | INTRAVENOUS | Status: AC
  Administered 2020-08-22: 3 g via INTRAVENOUS
  Filled 2020-08-22: qty 8

## 2020-08-22 MED ORDER — FOLIC ACID 5 MG/ML IJ SOLN
1.0000 mg | Freq: Every day | INTRAMUSCULAR | Status: DC
  Administered 2020-08-22 – 2020-08-31 (×10): 1 mg via INTRAVENOUS
  Filled 2020-08-22 (×10): qty 0.2

## 2020-08-22 MED ORDER — HYDROMORPHONE HCL 1 MG/ML IJ SOLN: 0.5000 mg | INTRAMUSCULAR | Status: DC | PRN

## 2020-08-22 MED ORDER — VITAMIN K1 10 MG/ML IJ SOLN
10.0000 mg | Freq: Once | INTRAVENOUS | Status: AC
  Administered 2020-08-22: 10 mg via INTRAVENOUS
  Filled 2020-08-22: qty 1

## 2020-08-22 MED ORDER — LORAZEPAM 2 MG/ML IJ SOLN: 0.0000 mg | INTRAMUSCULAR | Status: AC

## 2020-08-22 MED ORDER — ACETAMINOPHEN 650 MG RE SUPP
650.0000 mg | Freq: Four times a day (QID) | RECTAL | Status: DC | PRN
  Administered 2020-08-24: 650 mg via RECTAL
  Filled 2020-08-22: qty 1

## 2020-08-22 MED ORDER — SODIUM CHLORIDE 0.9 % IV SOLN: 10.0000 mL/h | Freq: Once | INTRAVENOUS | Status: DC

## 2020-08-22 MED ORDER — POTASSIUM CHLORIDE IN NACL 20-0.9 MEQ/L-% IV SOLN
INTRAVENOUS | Status: DC
  Filled 2020-08-22: qty 1000

## 2020-08-22 MED ORDER — LIP MEDEX EX OINT
TOPICAL_OINTMENT | CUTANEOUS | Status: DC | PRN
  Filled 2020-08-22: qty 7

## 2020-08-22 MED ORDER — ACETAMINOPHEN 325 MG PO TABS
650.0000 mg | ORAL_TABLET | Freq: Four times a day (QID) | ORAL | Status: DC | PRN
  Administered 2020-08-26: 650 mg via ORAL
  Filled 2020-08-22: qty 2

## 2020-08-22 MED ORDER — PANTOPRAZOLE 80MG IVPB - SIMPLE MED: 80.0000 mg | Freq: Once | INTRAVENOUS | Status: DC

## 2020-08-22 MED ORDER — THIAMINE HCL 100 MG/ML IJ SOLN
100.0000 mg | Freq: Every day | INTRAMUSCULAR | Status: DC
  Administered 2020-08-22 – 2020-08-26 (×5): 100 mg via INTRAVENOUS
  Filled 2020-08-22 (×5): qty 2

## 2020-08-22 MED ORDER — PANTOPRAZOLE INFUSION (NEW) - SIMPLE MED
8.0000 mg/h | INTRAVENOUS | Status: DC
  Administered 2020-08-22 – 2020-08-26 (×9): 8 mg/h via INTRAVENOUS
  Filled 2020-08-22: qty 80
  Filled 2020-08-22 (×2): qty 100
  Filled 2020-08-22: qty 80
  Filled 2020-08-22 (×2): qty 100
  Filled 2020-08-22 (×3): qty 80
  Filled 2020-08-22: qty 100
  Filled 2020-08-22 (×4): qty 80

## 2020-08-22 MED ORDER — SODIUM CHLORIDE 0.9 % IV SOLN
3.0000 g | Freq: Four times a day (QID) | INTRAVENOUS | Status: DC
  Administered 2020-08-22 – 2020-08-27 (×19): 3 g via INTRAVENOUS
  Filled 2020-08-22 (×2): qty 8
  Filled 2020-08-22 (×3): qty 3
  Filled 2020-08-22: qty 8
  Filled 2020-08-22: qty 3
  Filled 2020-08-22 (×3): qty 8
  Filled 2020-08-22 (×5): qty 3
  Filled 2020-08-22 (×2): qty 8
  Filled 2020-08-22: qty 3
  Filled 2020-08-22: qty 8
  Filled 2020-08-22: qty 3

## 2020-08-22 MED ORDER — MAGNESIUM SULFATE 2 GM/50ML IV SOLN
2.0000 g | Freq: Once | INTRAVENOUS | Status: AC
  Administered 2020-08-22: 2 g via INTRAVENOUS
  Filled 2020-08-22: qty 50

## 2020-08-22 MED ORDER — SODIUM CHLORIDE 0.9 % IV SOLN
50.0000 ug/h | INTRAVENOUS | Status: DC
  Administered 2020-08-22 – 2020-08-26 (×11): 50 ug/h via INTRAVENOUS
  Filled 2020-08-22 (×16): qty 1

## 2020-08-22 MED ORDER — CHLORHEXIDINE GLUCONATE CLOTH 2 % EX PADS
6.0000 | MEDICATED_PAD | Freq: Every day | CUTANEOUS | Status: DC
  Administered 2020-08-22 – 2020-08-27 (×6): 6 via TOPICAL

## 2020-08-22 MED ORDER — ORAL CARE MOUTH RINSE
15.0000 mL | Freq: Two times a day (BID) | OROMUCOSAL | Status: DC
  Administered 2020-08-22 – 2020-08-31 (×18): 15 mL via OROMUCOSAL

## 2020-08-22 MED ORDER — PANTOPRAZOLE 80MG IVPB - SIMPLE MED
80.0000 mg | Freq: Once | INTRAVENOUS | Status: DC
  Filled 2020-08-22: qty 100

## 2020-08-22 MED ORDER — PANTOPRAZOLE 80MG IVPB - SIMPLE MED
80.0000 mg | Freq: Once | INTRAVENOUS | Status: AC
  Administered 2020-08-22: 80 mg via INTRAVENOUS
  Filled 2020-08-22: qty 80

## 2020-08-22 MED ORDER — SODIUM CHLORIDE 0.9 % IV SOLN: INTRAVENOUS | Status: DC

## 2020-08-22 MED ORDER — ALBUMIN HUMAN 25 % IV SOLN
25.0000 g | Freq: Four times a day (QID) | INTRAVENOUS | Status: DC
  Administered 2020-08-22 – 2020-08-24 (×6): 25 g via INTRAVENOUS
  Filled 2020-08-22 (×6): qty 100

## 2020-08-22 MED ORDER — NICOTINE 21 MG/24HR TD PT24
21.0000 mg | MEDICATED_PATCH | Freq: Every day | TRANSDERMAL | Status: DC
  Administered 2020-08-22 – 2020-08-30 (×9): 21 mg via TRANSDERMAL
  Filled 2020-08-22 (×10): qty 1

## 2020-08-22 MED ORDER — SODIUM CHLORIDE 0.9 % IV BOLUS
1000.0000 mL | Freq: Once | INTRAVENOUS | Status: AC
  Administered 2020-08-22: 11:00:00 1000 mL via INTRAVENOUS

## 2020-08-22 NOTE — Progress Notes (Signed)
Pharmacy Antibiotic Note  Eric Dennis is a 55 y.o. male admitted on 08/22/2020 with GIB.  Pharmacy has been consulted for ampicillin/sulbactam dosing for aspiration PNA.  Today, 08/22/20 -WBC elevated -SCr elevated, CrCl ~45 mL/min  Plan: Ampicillin/sulbactam 3 g IV q6h Follow renal function  Height: 6\' 1"  (185.4 cm) Weight: 81.2 kg (179 lb) IBW/kg (Calculated) : 79.9  Temp (24hrs), Avg:98.1 F (36.7 C), Min:98.1 F (36.7 C), Max:98.1 F (36.7 C)  Recent Labs  Lab 08/22/20 1031  WBC 12.5*  CREATININE 2.06*    Estimated Creatinine Clearance: 45.3 mL/min (A) (by C-G formula based on SCr of 2.06 mg/dL (H)).    No Known Allergies  Antimicrobials this admission: Ampicillin/sulbactam 6/16 >>  Dose adjustments this admission:  Microbiology results: 6/16 BCx: Sent  Thank you for allowing pharmacy to be a part of this patient's care.  Lenis Noon, PharmD 08/22/2020 1:02 PM

## 2020-08-22 NOTE — Anesthesia Preprocedure Evaluation (Deleted)
Anesthesia Evaluation    Reviewed: Allergy & Precautions, Patient's Chart, lab work & pertinent test results  Airway        Dental   Pulmonary Current Smoker,           Cardiovascular   22 August 2020 EKG SR R 88 NSST changes  10/2018 Echo 1. The left ventricle has low normal systolic function, with an ejection  fraction of 50-55%. The cavity size was normal. Left ventricular diastolic  Doppler parameters are consistent with impaired relaxation.  2. The right ventricle has normal systolic function. The cavity was  normal. There is no increase in right ventricular wall thickness.  3. No evidence of mitral valve stenosis.  4. The aortic valve is tricuspid. No stenosis of the aortic valve.  5. The aorta is abnormal in size and structure.  6. There is mild dilatation of the aortic root measuring 41 mm.  7. Pulmonary hypertension is indeterminant, inadequate TR jet.  8. The interatrial septum was not well visualized.    Neuro/Psych negative neurological ROS     GI/Hepatic GERD  ,(+) Cirrhosis   Esophageal Varices  substance abuse  alcohol use, Lab Results      Component                Value               Date                      ALT                      41                  08/22/2020                AST                      81 (H)              08/22/2020                ALKPHOS                  94                  08/22/2020                BILITOT                  13.4 (H)            08/22/2020              Endo/Other    Renal/GU ARFRenal diseaseLab Results      Component                Value               Date                      CREATININE               2.06 (H)            08/22/2020                BUN                      28 (H)  08/22/2020                NA                       131 (L)             08/22/2020                K                        3.3 (L)             08/22/2020                CL                        89 (L)              08/22/2020                CO2                      28                  08/22/2020                Musculoskeletal   Abdominal   Peds  Hematology  (+) Blood dyscrasia, anemia , Lab Results      Component                Value               Date                      WBC                      10.9 (H)            08/22/2020                HGB                      4.4 (LL)            08/22/2020                HCT                      11.1 (L)            08/22/2020                MCV                      144.2 (H)           08/22/2020                PLT                      72 (L)              08/22/2020              Anesthesia Other Findings   Reproductive/Obstetrics                             Anesthesia Physical Anesthesia  Plan  ASA: 4 and emergent  Anesthesia Plan: MAC   Post-op Pain Management:    Induction:   PONV Risk Score and Plan: Treatment may vary due to age or medical condition  Airway Management Planned: Natural Airway  Additional Equipment: None  Intra-op Plan:   Post-operative Plan:   Informed Consent:     Dental advisory given  Plan Discussed with:   Anesthesia Plan Comments: (Hx of Melena and anemia for EGD)       Anesthesia Quick Evaluation

## 2020-08-22 NOTE — ED Triage Notes (Signed)
Patient states that he had blood work done yesterday and was called today to come in for a blood transfusion.

## 2020-08-22 NOTE — ED Notes (Signed)
Unable to complete orthostatics at this time, pt is very weak, hypotensive, has multiple transfusions going and requiring oxygen. Was a two-assist to get into the bed from wheelchair.

## 2020-08-22 NOTE — Progress Notes (Signed)
Date and time results received: 08/22/20 1503 (use smartphrase ".now" to insert current time)  Test: Lactic acid  Critical Value: 4.4  Name of Provider Notified: Yes  Orders Received? Or Actions Taken?: awaiting orders

## 2020-08-22 NOTE — ED Provider Notes (Signed)
[facility] DEPT Provider Note   CSN: [id number] Arrival date & time: 08/22/20  1009     History Chief Complaint  Patient presents with   Abnormal Lab    Eric Dennis is a 55 y.o. male with PMHx alcoholic cirrhosis of liver who presents to the ED today for abnormal lab. Pt reports he had routine lab work done and was called this morning and was told to come to the ED with likely need for blood transfusion. Pt reports he has been having dark colored stool for about 3 weeks. No hematemesis or coffee ground emesis. He has also felt SOB for the past month as well. Pt is still drinking about 1 beer every other day. He feels like his abdomen is more distended than normal and states he "has a hernia" which he has had an operation on in the past. It appears pt had 4.6 L of fluid drained on 05/13 for IR.     The history is provided by the patient and medical records.      Past Medical History:  Diagnosis Date   Elevated liver function tests    High cholesterol    Weight loss     Patient Active Problem List   Diagnosis Date Noted   Upper GI bleed 08/22/2020   Hypokalemia 08/22/2020   Hyponatremia 08/22/2020   AKI (acute kidney injury) ([place]) 32/20/2542   Alcoholic cirrhosis of liver with ascites ([place]) 08/22/2020   Acute blood loss anemia 08/22/2020   Acute hypoxemic respiratory failure ([place]) 08/22/2020   Aspiration pneumonia ([place]) 08/22/2020   Lymphadenopathy 02/16/2019   Orthostatic hypotension 10/08/2018   Thrombocytopenia ([place]) 10/08/2018   Transaminasemia    Syncope 10/07/2018   Idiopathic hypotension    Elevated liver enzymes 01/25/2014   Loss of weight 01/25/2014   TOBACCO ABUSE 08/13/2006   ALLERGIC RHINITIS 08/13/2006    Past Surgical History:  Procedure Laterality Date   COLONOSCOPY N/A 03/14/2014   Procedure: COLONOSCOPY;  Surgeon: Rogene Houston, MD;  Location[facility];  Service: Endoscopy;  Laterality: N/A;  730 - moved to  1/6 @ 12:00 - Ann notified pt   IR PARACENTESIS  07/19/2020   NO PAST SURGERIES         Family History  Problem Relation Age of Onset   Diabetes Mother    Heart disease Maternal Grandmother     Social History   Tobacco Use   Smoking status: Every Day    Packs/day: 0.25    Years: 20.00    Pack years: 5.00    Types: Cigarettes   Smokeless tobacco: Never   Tobacco comments:    1 pack every 4 days x 20 yrs  Vaping Use   Vaping Use: Never used  Substance Use Topics   Alcohol use: Yes    Alcohol/week: 14.0 standard drinks    Types: 14 Cans of beer per week   Drug use: No    Home Medications Prior to Admission medications   Medication Sig Start Date End Date Taking? Authorizing Provider  acetaminophen (TYLENOL) 500 MG tablet Take 1,000 mg by mouth every 6 (six) hours as needed for mild pain.   Yes [provider]  Cyanocobalamin (VITAMIN B12) 1000 MCG TBCR Take 1,000 mcg by mouth daily. 08/09/20  Yes [provider]  dimenhyDRINATE (DRAMAMINE) 50 MG tablet Take 50 mg by mouth every 8 (eight) hours as needed for nausea or dizziness.   Yes [provider]  folic acid (  FOLVITE) 1 MG tablet Take 1 tablet (1 mg total) by mouth daily. 10/10/18  Yes Tat, Shanon Brow, MD  furosemide (LASIX) 40 MG tablet Take 40 mg by mouth daily. 01/13/19  Yes [provider]  loperamide (IMODIUM) 2 MG capsule Take 2 mg by mouth daily as needed for diarrhea or loose stools.   Yes [provider]  Potassium 99 MG TABS Take 99 mg by mouth daily.   Yes [provider]  spironolactone (ALDACTONE) 100 MG tablet Take 100 mg by mouth daily. 08/08/20  Yes [provider]  Vitamin D, Ergocalciferol, (DRISDOL) 1.25 MG (50000 UT) CAPS capsule Take 50,000 Units by mouth once a week. Patient not taking: Reported on 08/22/2020 01/24/19   [provider]    Allergies    Patient has no known allergies.  Review of Systems   Review of Systems   Constitutional:  Negative for chills and fever.  Respiratory:  Positive for shortness of breath. Negative for cough.   Cardiovascular:  Negative for chest pain.  Gastrointestinal:  Positive for abdominal distention. Negative for nausea and vomiting.       + melena  All other systems reviewed and are negative.  Physical Exam Updated Vital Signs BP (!) 92/51 (BP Location: Right Arm)   Pulse 95   Temp 98.1 F (36.7 C) (Oral)   Resp 16   Ht 6\' 1"  (1.854 m)   Wt 81.2 kg   SpO2 (!) 89%   BMI 23.62 kg/m   Physical Exam Vitals and nursing note reviewed.  Constitutional:      Appearance: He is not ill-appearing or diaphoretic.  HENT:     Head: Normocephalic and atraumatic.  Eyes:     Conjunctiva/sclera: Conjunctivae normal.  Cardiovascular:     Rate and Rhythm: Normal rate and regular rhythm.     Pulses: Normal pulses.  Pulmonary:     Effort: Pulmonary effort is normal.     Breath sounds: Normal breath sounds. No wheezing, rhonchi or rales.     Comments: Currently on 4L [place]. Speaking in full sentences.  Abdominal:     General: There is distension.     Tenderness: There is no abdominal tenderness.     Comments: Ascitic abdomen   Genitourinary:    Comments: Chaperone present for GU exam. Melanotic stool on exam. Guaiac positive.  Musculoskeletal:     Cervical back: Neck supple.  Skin:    General: Skin is warm and dry.  Neurological:     Mental Status: He is alert.    ED Results / Procedures / Treatments   Labs (all labs ordered are listed, but only abnormal results are displayed) Labs Reviewed  CBC WITH DIFFERENTIAL/PLATELET - Abnormal; Notable for the following components:      Result Value   WBC 12.5 (*)    RBC 0.83 (*)    Hemoglobin 4.4 (*)    HCT 11.9 (*)    MCV 143.4 (*)    MCH 53.0 (*)    MCHC 37.0 (*)    RDW 18.5 (*)    Platelets 98 (*)    Neutro Abs 8.5 (*)    Monocytes Absolute 1.7 (*)    Abs Immature Granulocytes 0.15 (*)    All other components  within normal limits  COMPREHENSIVE METABOLIC PANEL - Abnormal; Notable for the following components:   Sodium 131 (*)    Potassium 3.3 (*)    Chloride 89 (*)    Glucose, Bld 110 (*)  BUN 28 (*)    Creatinine, Ser 2.06 (*)    Calcium 8.7 (*)    Albumin 1.8 (*)    AST 81 (*)    Total Bilirubin 13.4 (*)    GFR, Estimated 37 (*)    All other components within normal limits  PROTIME-INR - Abnormal; Notable for the following components:   Prothrombin Time 32.0 (*)    INR 3.1 (*)    All other components within normal limits  APTT - Abnormal; Notable for the following components:   aPTT 47 (*)    All other components within normal limits  POC OCCULT BLOOD, ED - Abnormal; Notable for the following components:   Fecal Occult Bld POSITIVE (*)    All other components within normal limits  RESP PANEL BY RT-PCR (FLU A&B, COVID) ARPGX2  CULTURE, BLOOD (ROUTINE X 2)  CULTURE, BLOOD (ROUTINE X 2)  LACTIC ACID, PLASMA  LACTIC ACID, PLASMA  TYPE AND SCREEN  PREPARE RBC (CROSSMATCH)    EKG EKG Interpretation  Date/Time:  Thursday August 22 2020 10:38:51 EDT Ventricular Rate:  88 PR Interval:  172 QRS Duration: 126 QT Interval:  471 QTC Calculation: 570 R Axis:   -58 Text Interpretation: Sinus rhythm Nonspecific IVCD with LAD Nonspecific T abnrm, anterolateral leads No significant change since last tracing Confirmed by Isla Pence [phone]) on 08/22/2020 11:02:14 AM  Radiology DG Chest Port 1 View  Result Date: 08/22/2020 CLINICAL DATA:  Shortness of breath, hypoxia. EXAM: PORTABLE CHEST 1 VIEW COMPARISON:  October 07, 2018. FINDINGS: Borderline enlargement of the cardiac silhouette. Streaky left basilar opacities. No visible pleural effusions or pneumothorax. No acute osseous abnormality. IMPRESSION: Streaky left basilar opacities, which could represent atelectasis, aspiration, and/or pneumonia. Electronically Signed   By: Margaretha Sheffield MD   On: 08/22/2020 10:59     Procedures .Critical Care  Date/Time: 08/22/2020 12:05 PM Performed by: Eustaquio Maize, PA-C Authorized by: Eustaquio Maize, PA-C   Critical care provider statement:    Critical care time (minutes):  60   Critical care was time spent personally by me on the following activities:  Discussions with consultants, evaluation of patient's response to treatment, examination of patient, ordering and performing treatments and interventions, ordering and review of laboratory studies, ordering and review of radiographic studies, pulse oximetry, re-evaluation of patient's condition, obtaining history from patient or surrogate and review of old charts   Medications Ordered in ED Medications  sodium chloride 0.9 % bolus 1,000 mL (0 mLs Intravenous Stopped 08/22/20 1130)    And  0.9 %  sodium chloride infusion ( Intravenous New Bag/Given 08/22/20 1122)  pantoprazole (PROTONIX) 80 mg in sodium chloride 0.9 % 100 mL (0.8 mg/mL) infusion (8 mg/hr Intravenous New Bag/Given 08/22/20 1120)  octreotide (SANDOSTATIN) 2 mcg/mL load via infusion 50 mcg (50 mcg Intravenous Bolus from Bag 08/22/20 1119)    And  octreotide (SANDOSTATIN) 500 mcg in sodium chloride 0.9 % 250 mL (2 mcg/mL) infusion (50 mcg/hr Intravenous New Bag/Given 08/22/20 1119)  phytonadione (VITAMIN K) 10 mg in dextrose 5 % 50 mL IVPB (10 mg Intravenous New Bag/Given 08/22/20 1205)  0.9 %  sodium chloride infusion (has no administration in time range)  Ampicillin-Sulbactam (UNASYN) 3 g in sodium chloride 0.9 % 100 mL IVPB (has no administration in time range)  pantoprazole (PROTONIX) 80 mg /NS 100 mL IVPB (0 mg Intravenous Stopped 08/22/20 1151)    ED Course  I have reviewed the triage vital signs and the nursing notes.  Pertinent labs &  imaging results that were available during my care of the patient were reviewed by me and considered in my medical decision making (see chart for details).    MDM Rules/Calculators/A&P                           55  year old male presenting to the ED with abnormal lab/need for blood transfusion. Hx of alcoholic cirrhosis with ascites; still drinking. On arrival to the ED today pt is hypoxic at 89% on RA; placed on 2L initially with increase to 90%; currently on 4L satting 97%. Not typically on oxygen. Also found to be hypotensive 92/51. Pt with SOB for the past month. Does appear he had a paracentesis done last month with 4.6 L taken off. Attending physician Dr. Gilford Raid received secure message from Dr. Therisa Doyne regarding this patient - hgb 4.9 yesterday and platelets of 92. Recommended medicine admission with protonix and octreotide with plans for EGD once Hgb > 7. Will work up for anemia/concern for GI bleed. Will repeat hgb prior to blood transfusion. Will also obtain CXR and COVID test due to hypoxia.   EKG without acute ischemic changes PT INR 3.1 and APTT 47. Does appear Dr. Therisa Doyne has ordered vitamin K as well. CMP with sodium 131, potassium 3.3, chloride 89. Creatinine 2.06 and BUN 28 (creatinine 0.87 08/08/20). Albumin 1.8, AST 81, and T bili 13.4 CXR: IMPRESSION:  Streaky left basilar opacities, which could represent atelectasis,  aspiration, and/or pneumonia.     Will await COVID test prior to starting possible abx treatment for aspiration pneumonia however may account for his hypoxia today vs hypoxia s/2 ascities  Melanotic stool on exam with fecal occult positive stool CBC with hgb 4.4 and platelets 98 today. Pt also with an elevated WBC count of 12.5 with left shift. COVID and flu test negative. Will add on lactic acid, blood cultures, and start on unasyn for aspiration pneumonia. Blood transfusion ordered at this time. Will admit to medicine.   Discussed case with [facility] Dr. Doristine Bosworth who agrees to evaluate patient for admission.   This note was prepared using Dragon voice recognition software and may include unintentional dictation errors due to the inherent limitations of voice  recognition software.  Final Clinical Impression(s) / ED Diagnoses Final diagnoses:  Upper GI bleed  Alcoholic cirrhosis of liver with ascites ([place])  Symptomatic anemia  Aspiration pneumonia of left upper lobe, unspecified aspiration pneumonia type [facility])    Rx / DC Orders ED Discharge Orders     None        Eustaquio Maize, PA-C 08/22/20 1217    Isla Pence, MD 08/22/20 1436

## 2020-08-22 NOTE — H&P (Addendum)
History and Physical    Eric Dennis NID:[id number] DOB: Mar 18, 1965 DOA: 08/22/2020  PCP: Scheryl Marten, PA  Patient coming from: Home  I have personally briefly reviewed patient's old medical records in [place]  Chief Complaint: Abnormal labs  HPI: Eric Dennis is a 55 y.o. male with medical history significant of alcoholic cirrhosis, tobacco abuse, alcohol abuse ascites presents here for evaluation of abnormal labs.  Patient tells me that he had routine labs done yesterday and hemoglobin was noted to be 4.9 and he was called and was advised to go to the ER for further evaluation and management.  He reports dark stools for approximately 2 to 3 weeks, abdominal distention and pain.  He denies bright red blood per rectum, hematemesis, nausea, vomiting, epigastric burning, over-the-counter use of NSAIDs.  He is not on blood thinners.  He is on Lasix and Aldactone and reports being compliant with it.  His last alcohol intake (1 beer) was on the weekend.  Reports dry cough, chest congestion and exertional shortness of breath however denies sputum production, chest pain, wheezing, leg swelling, orthopnea or PND. As per wife- His tooth was pulled out last week  and he was bleeding in his mouth.  He continues to smoke cigarettes every day, denies any illicit drug use.  His last paracentesis was done on 07/19/2020.  His last colonoscopy was in 2016 which was negative for polyps or diverticulosis.  ED Course: Upon arrival to ED: Patient, blood pressure 92/51, hypoxic with oxygen saturation on 89% on room air placed on 3 L of oxygen, chest x-ray concerning for atelectasis versus aspiration pneumonia, hemoglobin 4.4, WBC 12.5, MCV 80, platelet 98, CMP shows sodium 131, potassium 3.3, AKI BUN of 28, GFR 37,  total bilirubin 13.4, AST 81, INR: 3.1.  Due to elevated white cell count and new requirement of oxygen-patient started on Unasyn.  COVID-19 negative.  2 unit PRBC ordered.  GI  consulted and recommended octreotide and PPI Gtt. [facility] consulted for admission for acute GI bleed and possible aspiration pneumonia  Review of Systems: As per HPI otherwise negative.    Past Medical History:  Diagnosis Date   Elevated liver function tests    High cholesterol    Weight loss     Past Surgical History:  Procedure Laterality Date   COLONOSCOPY N/A 03/14/2014   Procedure: COLONOSCOPY;  Surgeon: Rogene Houston, MD;  [facility];  Service: Endoscopy;  Laterality: N/A;  730 - moved to 1/6 @ 12:00 - Ann notified pt   IR PARACENTESIS  07/19/2020   NO PAST SURGERIES       reports that he has been smoking cigarettes. He has a 5.00 pack-year smoking history. He has never used smokeless tobacco. He reports current alcohol use of about 14.0 standard drinks of alcohol per week. He reports that he does not use drugs.  No Known Allergies  Family History  Problem Relation Age of Onset   Diabetes Mother    Heart disease Maternal Grandmother     Prior to Admission medications   Medication Sig Start Date End Date Taking? Authorizing Provider  acetaminophen (TYLENOL) 500 MG tablet Take 1,000 mg by mouth every 6 (six) hours as needed for mild pain.   Yes [provider]  Cyanocobalamin (VITAMIN B12) 1000 MCG TBCR Take 1,000 mcg by mouth daily. 08/09/20  Yes [provider]  dimenhyDRINATE (DRAMAMINE) 50 MG tablet Take 50 mg by mouth every 8 (eight) hours  as needed for nausea or dizziness.   Yes [provider]  folic acid (FOLVITE) 1 MG tablet Take 1 tablet (1 mg total) by mouth daily. 10/10/18  Yes Tat, Shanon Brow, MD  furosemide (LASIX) 40 MG tablet Take 40 mg by mouth daily. 01/13/19  Yes [provider]  loperamide (IMODIUM) 2 MG capsule Take 2 mg by mouth daily as needed for diarrhea or loose stools.   Yes [provider]  Potassium 99 MG TABS Take 99 mg by mouth daily.   Yes [provider]  spironolactone  (ALDACTONE) 100 MG tablet Take 100 mg by mouth daily. 08/08/20  Yes [provider]  Vitamin D, Ergocalciferol, (DRISDOL) 1.25 MG (50000 UT) CAPS capsule Take 50,000 Units by mouth once a week. Patient not taking: Reported on 08/22/2020 01/24/19   [provider]    Physical Exam: Vitals:   08/22/20 1115 08/22/20 1130 08/22/20 1200 08/22/20 1210  BP:  115/75 (!) 86/42 (!) 94/43  Pulse: 87 89 87 92  Resp: 15 (!) 21 18 13   Temp:      TempSrc:      SpO2: 98% 97% 97% 96%  Weight:      Height:        Constitutional: NAD, calm, comfortable,, appears weak, lethargic, icteric, on 3 L of oxygen via nasal cannula, sleepy but arousable Eyes: PERRL, lids and icteric conjunctiva  ENMT: Mucous membranes are moist. Posterior pharynx clear of any exudate or lesions.Normal dentition.  Neck: normal, supple, no masses, no thyromegaly Respiratory: Bilateral coarse breath sounds noted.   Cardiovascular: Regular rate and rhythm, no murmurs / rubs / gallops. No extremity edema. 2+ pedal pulses. No carotid bruits.  Abdomen: Abdomen distended, mildly tender on palpation, bowel sounds positive Musculoskeletal: no clubbing / cyanosis. No joint deformity upper and lower extremities. Good ROM, no contractures. Normal muscle tone.  Skin: no rashes, lesions, ulcers. No induration Neurologic: CN 2-12 grossly intact. Sensation intact, DTR normal. Strength 5/5 in all 4.  Psychiatric: Normal judgment and insight. Alert and oriented x 3. Normal mood.    Labs on Admission: I have personally reviewed following labs and imaging studies  CBC: Recent Labs  Lab 08/22/20 1031  WBC 12.5*  NEUTROABS 8.5*  HGB 4.4*  HCT 11.9*  MCV 143.4*  PLT 98*   Basic Metabolic Panel: Recent Labs  Lab 08/22/20 1031  NA 131*  K 3.3*  CL 89*  CO2 28  GLUCOSE 110*  BUN 28*  CREATININE 2.06*  CALCIUM 8.7*   GFR: Estimated Creatinine Clearance: 45.3 mL/min (A) (by C-G formula based on SCr of 2.06 mg/dL  (H)). Liver Function Tests: Recent Labs  Lab 08/22/20 1031  AST 81*  ALT 41  ALKPHOS 94  BILITOT 13.4*  PROT 6.9  ALBUMIN 1.8*   No results for input(s): LIPASE, AMYLASE in the last 168 hours. No results for input(s): AMMONIA in the last 168 hours. Coagulation Profile: Recent Labs  Lab 08/22/20 1031  INR 3.1*   Cardiac Enzymes: No results for input(s): CKTOTAL, CKMB, CKMBINDEX, TROPONINI in the last 168 hours. BNP (last 3 results) No results for input(s): PROBNP in the last 8760 hours. HbA1C: No results for input(s): HGBA1C in the last 72 hours. CBG: No results for input(s): GLUCAP in the last 168 hours. Lipid Profile: No results for input(s): CHOL, HDL, LDLCALC, TRIG, CHOLHDL, LDLDIRECT in the last 72 hours. Thyroid Function Tests: No results for input(s): TSH, T4TOTAL, FREET4, T3FREE, THYROIDAB in the last 72 hours. Anemia  Panel: No results for input(s): VITAMINB12, FOLATE, FERRITIN, TIBC, IRON, RETICCTPCT in the last 72 hours. Urine analysis:    Component Value Date/Time   COLORURINE AMBER (A) 10/07/2018 1615   APPEARANCEUR HAZY (A) 10/07/2018 1615   LABSPEC 1.025 10/07/2018 1615   PHURINE 6.0 10/07/2018 1615   GLUCOSEU NEGATIVE 10/07/2018 1615   HGBUR NEGATIVE 10/07/2018 1615   BILIRUBINUR MODERATE (A) 10/07/2018 1615   KETONESUR NEGATIVE 10/07/2018 1615   PROTEINUR NEGATIVE 10/07/2018 1615   NITRITE NEGATIVE 10/07/2018 1615   LEUKOCYTESUR NEGATIVE 10/07/2018 1615    Radiological Exams on Admission: DG Chest Port 1 View  Result Date: 08/22/2020 CLINICAL DATA:  Shortness of breath, hypoxia. EXAM: PORTABLE CHEST 1 VIEW COMPARISON:  October 07, 2018. FINDINGS: Borderline enlargement of the cardiac silhouette. Streaky left basilar opacities. No visible pleural effusions or pneumothorax. No acute osseous abnormality. IMPRESSION: Streaky left basilar opacities, which could represent atelectasis, aspiration, and/or pneumonia. Electronically Signed   By: Margaretha Sheffield MD   On: 08/22/2020 10:59    EKG: Independently reviewed.  Sinus rhythm, nonspecific T wave abnormalities.  No acute ST-T wave elevation or depression noted.  Assessment/Plan Principal Problem:   Upper GI bleed Active Problems:   Thrombocytopenia ([facility])   Hypokalemia   Hyponatremia   AKI (acute kidney injury) ([place])   Alcoholic cirrhosis of liver with ascites ([facility])   Acute blood loss anemia   Acute hypoxemic respiratory failure ([facility])   Aspiration pneumonia ([facility])   GI bleed  Acute GI bleed Symptomatic anemia: -Patient presented with melena and severe anemia in the setting of alcoholic liver cirrhosis. -Hemoglobin 4.4 upon arrival.  POC occult blood positive. -INR elevated at 3.1 -2 unit PRBC ordered by EDP. monitor H&H closely -Continue octreotide and PPI gtt. No NSAIDs -Monitor vitals closely. -Appreciate GIs recommendation-recommend EGD once patient's hemoglobin greater than 7.  Acute hypoxemic respiratory failure in the setting of possible aspiration pneumonia: -Patient is afebrile with leukocytosis of 12.5, new oxygen requirement.  -Reviewed chest x-ray.  Continue Unasyn -Blood culture is pending.  Will check procalcitonin level -On continuous pulse ox we will try to wean off of oxygen as tolerated  AKI: -BUN 28, creatinine 2.06, GFR 37 -Likely prerenal in the setting of GI bleeding -Agree with IV albumin & hold IVF at this time -Continue Iwith blood transfusion.  Avoid nephrotoxic medication.  Repeat BMP tomorrow a.m.  Lactic acidosis Hypotension: -Continue IV albumin.  Monitor vitals closely.  Trend lactic acid.  Hyponatremia/hypokalemia/hypochloremia: -Continue electrolyte replacement - Repeat BMP tomorrow a.m.  Hypomagnesemia: Magnesium level 1.5.  Replenished.  Repeat magnesium level tomorrow a.m.  Macrocytosis: -Likely in the setting of alcohol abuse -We will check iron studies, B12 and folate level  Supratherapeutic INR: -Vitamin K 10 mg x 1.   Monitor INR closely  Thrombocytopenia: Chronic -In the setting of alcohol abuse.  Repeat CBC tomorrow a.m.  Elevated liver enzymes Alcoholic liver cirrhosis Ascites: Hyperbilirubinemia -We will hold Lasix and Aldactone at this time due to AKI and soft blood pressure. -INO's and daily weight. -May need paracentesis-MELD-Na score: 37 points (65 to 66% estimated 90-day mortality) -Monitor liver enzymes closely.  Avoid hepatotoxic medications  Tobacco abuse: We will add nicotine patch  Alcohol abuse:  -Seizure precautions.  Monitor for DTs. -Start on CIWA protocol  Patient is on increased risk of morbidity and mortality.I discussed the plan with patient's wife and she verbalized understanding  DVT prophylaxis: SCD, no chemical anticoagulation due to GI bleed, thrombocytopenia and elevated INR Code Status:  Full code Family Communication: Patient's wife present at bedside.  Plan of care discussed with patient in length and he verbalized understanding and agreed with it. Disposition Plan: Home Consults called: GI Admission status: Inpatien   Mckinley Jewel MD [facility]  If 7PM-7AM, please contact night-coverage [email or web address]  08/22/2020, 12:22 PM

## 2020-08-22 NOTE — Consult Note (Signed)
Referring Provider: ED Primary Care Physician:  Scheryl Marten, [place] Primary Gastroenterologist:  Dr. Therisa Doyne [facility])  Reason for Consultation:  Upper GI bleeding  HPI: Eric Dennis is a 55 y.o. male with past medical history of alcohol-related cirrhosis with ascites presenting for consultation of presumed upper GI bleeding.  Patient was seen in the office yesterday and Hgb was found to be 4.9. Thus, he was advised to present to the ED.  He reports melenic stools for approximately 3 weeks.  Denies hematochezia.  Reports nausea but denies vomiting, changes in appetite, or dysphagia.  Reports diffuse abdominal pain and worsening abdominal distention. Reports he has gained some weight due to fluid.  As an outpatient, he is on 40 mg Lasix and 100 mg spironolactone.  Denies ASA, NSAID, or blood thinner use.  Drinks one beer every other day.  Last Paracentesis, 460 ml fluid removed on 07/19/20:WBC 233, Neutrophils 11%,Total protein <3,normal cytology, no WBC and no growth.  Normal AFP.  He has never had an EGD.  Last colonoscopy in 03/2014: internal and external hemorrhoids, otherwise normal.         Past Medical History:  Diagnosis Date   Elevated liver function tests    High cholesterol    Weight loss     Past Surgical History:  Procedure Laterality Date   COLONOSCOPY N/A 03/14/2014   Procedure: COLONOSCOPY;  Surgeon: Rogene Houston, MD;  Location: [facility];  Service: Endoscopy;  Laterality: N/A;  730 - moved to 1/6 @ 12:00 - Ann notified pt   IR PARACENTESIS  07/19/2020   NO PAST SURGERIES      Prior to Admission medications   Medication Sig Start Date End Date Taking? Authorizing Provider  folic acid (FOLVITE) 1 MG tablet Take 1 tablet (1 mg total) by mouth daily. Patient taking differently: Take 1 mg by mouth every other day.  10/10/18   Orson Eva, MD  furosemide (LASIX) 20 MG tablet Take 20 mg by mouth as needed.  01/13/19   [provider]  ibuprofen (ADVIL) 400  MG tablet Take 400 mg by mouth every 6 (six) hours as needed.    [provider]  potassium chloride (KLOR-CON) 10 MEQ tablet Take 10 mEq by mouth as needed.  12/29/18   [provider]  tiZANidine (ZANAFLEX) 4 MG tablet Take 4 mg by mouth 3 (three) times daily as needed. 12/29/18   [provider]  Vitamin D, Ergocalciferol, (DRISDOL) 1.25 MG (50000 UT) CAPS capsule Take 50,000 Units by mouth once a week. 01/24/19   [provider]    Scheduled Meds:  octreotide  50 mcg Intravenous Once   Continuous Infusions:  sodium chloride     octreotide  (SANDOSTATIN)    IV infusion     pantoprazole     pantoprozole (PROTONIX) infusion     PRN Meds:.  Allergies as of 08/22/2020   (No Known Allergies)    Family History  Problem Relation Age of Onset   Diabetes Mother    Heart disease Maternal Grandmother     Social History   Socioeconomic History   Marital status: Divorced    Spouse name: Not on file   Number of children: 1   Years of education: Not on file   Highest education level: Not on file  Occupational History    Employer: [employer]  Tobacco Use   Smoking status: Every Day    Packs/day: 0.25    Years: 20.00  Pack years: 5.00    Types: Cigarettes   Smokeless tobacco: Never   Tobacco comments:    1 pack every 4 days x 20 yrs  Vaping Use   Vaping Use: Never used  Substance and Sexual Activity   Alcohol use: Yes    Alcohol/week: 14.0 standard drinks    Types: 14 Cans of beer per week   Drug use: No   Sexual activity: Yes  Other Topics Concern   Not on file  Social History Narrative   Not on file   Social Determinants of Health   Financial Resource Strain: Not on file  Food Insecurity: Not on file  Transportation Needs: Not on file  Physical Activity: Not on file  Stress: Not on file  Social Connections: Not on file  Intimate Partner Violence: Not on file    Review of Systems: Review of Systems  Constitutional:   Negative for chills, fever and weight loss.  HENT:  Negative for hearing loss and tinnitus.   Eyes:  Negative for pain and redness.  Respiratory:  Negative for cough and shortness of breath.   Cardiovascular:  Negative for chest pain and palpitations.  Gastrointestinal:  Positive for abdominal pain, melena and nausea. Negative for blood in stool, constipation, diarrhea, heartburn and vomiting.  Genitourinary:  Negative for flank pain and hematuria.  Musculoskeletal:  Negative for falls and joint pain.  Skin:  Negative for itching and rash.  Neurological:  Negative for seizures and loss of consciousness.  Endo/Heme/Allergies:  Negative for polydipsia. Does not bruise/bleed easily.  Psychiatric/Behavioral:  Negative for memory loss. The patient is not nervous/anxious.     Physical Exam: Vital signs: Vitals:   08/22/20 1055 08/22/20 1100  BP: (!) 100/54 (!) 100/54  Pulse: 87 88  Resp: 15 18  Temp:    SpO2: 98% 97%      Physical Exam Vitals reviewed.  Constitutional:      General: He is not in acute distress. HENT:     Head: Normocephalic and atraumatic.     Nose: Nose normal. No congestion.     Mouth/Throat:     Mouth: Mucous membranes are moist.     Pharynx: Oropharynx is clear.  Eyes:     General: Scleral icterus present.     Extraocular Movements: Extraocular movements intact.  Cardiovascular:     Rate and Rhythm: Normal rate and regular rhythm.  Pulmonary:     Effort: Pulmonary effort is normal.     Breath sounds: Normal breath sounds.  Abdominal:     General: Bowel sounds are normal. There is distension.     Palpations: Abdomen is soft. There is no mass.     Tenderness: There is no abdominal tenderness. There is no guarding or rebound.     Hernia: A hernia is present.  Musculoskeletal:        General: No swelling or tenderness.     Cervical back: Normal range of motion and neck supple.  Skin:    General: Skin is warm and dry.     Coloration: Skin is jaundiced.   Neurological:     General: No focal deficit present.     Mental Status: He is oriented to person, place, and time. He is lethargic.  Psychiatric:        Mood and Affect: Mood normal.        Behavior: Behavior normal. Behavior is cooperative.     GI:  Lab Results: No results for input(s): WBC, HGB, HCT, PLT  in the last 72 hours. BMET No results for input(s): NA, K, CL, CO2, GLUCOSE, BUN, CREATININE, CALCIUM in the last 72 hours. LFT No results for input(s): PROT, ALBUMIN, AST, ALT, ALKPHOS, BILITOT, BILIDIR, IBILI in the last 72 hours. PT/INR No results for input(s): LABPROT, INR in the last 72 hours.   Studies/Results: DG Chest Port 1 View  Result Date: 08/22/2020 CLINICAL DATA:  Shortness of breath, hypoxia. EXAM: PORTABLE CHEST 1 VIEW COMPARISON:  October 07, 2018. FINDINGS: Borderline enlargement of the cardiac silhouette. Streaky left basilar opacities. No visible pleural effusions or pneumothorax. No acute osseous abnormality. IMPRESSION: Streaky left basilar opacities, which could represent atelectasis, aspiration, and/or pneumonia. Electronically Signed   By: Margaretha Sheffield MD   On: 08/22/2020 10:59    Impression: Suspected Upper GI Bleeding: melena and anemia.  Portal hypertensive gastropathy vs. Variceal bleeding. -Hgb 4.4, decreased from 8.0 on 6/2 -INR 3.1  Alcohol related cirrhosis: MELD Na score of 37 (65-66% 30-day mortality) -T bili 13.4/AST 81/ALT 41/ALP 94 -INR 3.1 -Platelets 98K/uL  AKI: BUN 28/ Cr 2.06  Plan: Start Protonix and octreotide drips.  Vitamin K 10 mg x 1.  Repeat INR tomorrow morning.  When NR <2, plan to proceed with EGD for further evaluation.  I thoroughly discussed the procedure with the patient to include nature, alternatives, benefits, and risks (including but not limited to bleeding, infection, perforation, anesthesia/cardiac and pulmonary complications).  Patient verbalized understanding and gave verbal consent to proceed with  EGD.  Continue supportive care.  Continue to monitor H&H with transfusion as needed to maintain hemoglobin greater than 7.  [facility] will follow.   LOS: 0 days   Salley Slaughter  PA-C 08/22/2020, 11:05 AM  Contact #  [phone]

## 2020-08-23 ENCOUNTER — Encounter (HOSPITAL_COMMUNITY): Payer: Self-pay | Admitting: Internal Medicine

## 2020-08-23 DIAGNOSIS — R7401 Elevation of levels of liver transaminase levels: Secondary | ICD-10-CM

## 2020-08-23 DIAGNOSIS — N179 Acute kidney failure, unspecified: Secondary | ICD-10-CM

## 2020-08-23 DIAGNOSIS — D696 Thrombocytopenia, unspecified: Secondary | ICD-10-CM

## 2020-08-23 DIAGNOSIS — K089 Disorder of teeth and supporting structures, unspecified: Secondary | ICD-10-CM

## 2020-08-23 DIAGNOSIS — D689 Coagulation defect, unspecified: Secondary | ICD-10-CM

## 2020-08-23 DIAGNOSIS — F17200 Nicotine dependence, unspecified, uncomplicated: Secondary | ICD-10-CM

## 2020-08-23 DIAGNOSIS — F1010 Alcohol abuse, uncomplicated: Secondary | ICD-10-CM

## 2020-08-23 LAB — COMPREHENSIVE METABOLIC PANEL
ALT: 33 U/L (ref 0–44)
AST: 66 U/L — ABNORMAL HIGH (ref 15–41)
Albumin: 1.9 g/dL — ABNORMAL LOW (ref 3.5–5.0)
Alkaline Phosphatase: 71 U/L (ref 38–126)
Anion gap: 7 (ref 5–15)
BUN: 18 mg/dL (ref 6–20)
CO2: 31 mmol/L (ref 22–32)
Calcium: 8.3 mg/dL — ABNORMAL LOW (ref 8.9–10.3)
Chloride: 97 mmol/L — ABNORMAL LOW (ref 98–111)
Creatinine, Ser: 1.02 mg/dL (ref 0.61–1.24)
GFR, Estimated: >60 mL/min (ref >60)
Glucose, Bld: 95 mg/dL (ref 70–99)
Potassium: 3.8 mmol/L (ref 3.5–5.1)
Sodium: 135 mmol/L (ref 135–145)
Total Bilirubin: 17 mg/dL — ABNORMAL HIGH (ref 0.3–1.2)
Total Protein: 6 g/dL — ABNORMAL LOW (ref 6.5–8.1)

## 2020-08-23 LAB — AMMONIA: Ammonia: 65 umol/L — ABNORMAL HIGH (ref 9–35)

## 2020-08-23 LAB — LACTIC ACID, PLASMA
Lactic Acid, Venous: 2.1 mmol/L (ref 0.5–1.9)
Lactic Acid, Venous: 3.3 mmol/L (ref 0.5–1.9)

## 2020-08-23 LAB — CBC
HCT: 19.4 % — ABNORMAL LOW (ref 39.0–52.0)
Hemoglobin: 7 g/dL — ABNORMAL LOW (ref 13.0–17.0)
MCH: 39.3 pg — ABNORMAL HIGH (ref 26.0–34.0)
MCHC: 36.1 g/dL — ABNORMAL HIGH (ref 30.0–36.0)
MCV: 109 fL — ABNORMAL HIGH (ref 80.0–100.0)
Platelets: 82 10*3/uL — ABNORMAL LOW (ref 150–400)
RBC: 1.78 MIL/uL — ABNORMAL LOW (ref 4.22–5.81)
WBC: 9.1 10*3/uL (ref 4.0–10.5)
nRBC: 0 % (ref 0.0–0.2)

## 2020-08-23 LAB — APTT: aPTT: 47 seconds — ABNORMAL HIGH (ref 24–36)

## 2020-08-23 LAB — PHOSPHORUS: Phosphorus: 4.1 mg/dL (ref 2.5–4.6)

## 2020-08-23 LAB — MAGNESIUM: Magnesium: 1.9 mg/dL (ref 1.7–2.4)

## 2020-08-23 LAB — PROTIME-INR
INR: 2.6 — ABNORMAL HIGH (ref 0.8–1.2)
Prothrombin Time: 28.1 seconds — ABNORMAL HIGH (ref 11.4–15.2)

## 2020-08-23 LAB — PREPARE RBC (CROSSMATCH)

## 2020-08-23 MED ORDER — SODIUM CHLORIDE 0.9% IV SOLUTION: Freq: Once | INTRAVENOUS | Status: DC

## 2020-08-23 MED ORDER — SODIUM CHLORIDE 0.9 % IV SOLN
INTRAVENOUS | Status: DC | PRN
  Administered 2020-08-23 (×2): 250 mL via INTRAVENOUS

## 2020-08-23 MED ORDER — PROPOFOL 500 MG/50ML IV EMUL
INTRAVENOUS | Status: AC
  Filled 2020-08-23: qty 50

## 2020-08-23 MED ORDER — SODIUM CHLORIDE 0.9% IV SOLUTION: Freq: Once | INTRAVENOUS | Status: AC

## 2020-08-23 MED ORDER — LACTULOSE 10 GM/15ML PO SOLN
30.0000 g | Freq: Three times a day (TID) | ORAL | Status: DC
  Administered 2020-08-23 (×3): 30 g via ORAL
  Filled 2020-08-23 (×3): qty 45

## 2020-08-23 MED ORDER — PROPOFOL 10 MG/ML IV BOLUS
INTRAVENOUS | Status: AC
  Filled 2020-08-23: qty 20

## 2020-08-23 MED ORDER — VITAMIN K1 10 MG/ML IJ SOLN
10.0000 mg | Freq: Once | INTRAVENOUS | Status: AC
  Administered 2020-08-23: 10 mg via INTRAVENOUS
  Filled 2020-08-23: qty 1

## 2020-08-23 NOTE — TOC Initial Note (Signed)
Transition of Care [facility]) - Initial/Assessment Note    Patient Details  Name: Eric Dennis MRN: [id number] Date of Birth: 1966/02/16  Transition of Care [facility]) CM/SW Contact:    Leeroy Cha, RN Phone Number: 08/23/2020, 7:43 AM  Clinical Narrative:                  55 y.o. male with medical history significant of alcoholic cirrhosis, tobacco abuse, alcohol abuse ascites presents here for evaluation of abnormal labs.   Patient tells me that he had routine labs done yesterday and hemoglobin was noted to be 4.9 and he was called and was advised to go to the ER for further evaluation and management.  He reports dark stools for approximately 2 to 3 weeks, abdominal distention and pain.  He denies bright red blood per rectum, hematemesis, nausea, vomiting, epigastric burning, over-the-counter use of NSAIDs.  He is not on blood thinners.  He is on Lasix and Aldactone and reports being compliant with it.  His last alcohol intake (1 beer) was on the weekend.   Reports dry cough, chest congestion and exertional shortness of breath however denies sputum production, chest pain, wheezing, leg swelling, orthopnea or PND. As per wife- His tooth was pulled out last week  and he was bleeding in his mouth.   He continues to smoke cigarettes every day, denies any illicit drug use.  His last paracentesis was done on 07/19/2020.  His last colonoscopy was in 2016 which was negative for polyps or diverticulosis.   ED Course: Upon arrival to ED: Patient, blood pressure 92/51, hypoxic with oxygen saturation on 89% on room air placed on 3 L of oxygen, chest x-ray concerning for atelectasis versus aspiration pneumonia, hemoglobin 4.4, WBC 12.5, MCV 80, platelet 98, CMP shows sodium 131, potassium 3.3, AKI BUN of 28, GFR 37,  total bilirubin 13.4, AST 81, INR: 3.1.  Due to elevated white cell count and new requirement of oxygen-patient started on Unasyn.  COVID-19 negative.  2 unit PRBC ordered.  GI consulted and  recommended octreotide and PPI Gtt. [facility] hospitalist consulted for admission for acute GI bleed and possible aspiration pneumonia PLAN:  to return to home with self care.  Hgb 061722=5.3 rec'ing one unit prbc and albumin q6hrs., IV Sandostatin and proptonix iv Expected Discharge Plan: Home/Self Care Barriers to Discharge: Continued Medical Work up   Patient Goals and CMS Choice Patient states their goals for this hospitalization and ongoing recovery are:: to go home CMS Medicare.gov Compare Post Acute Care list provided to:: Patient    Expected Discharge Plan and Services Expected Discharge Plan: Home/Self Care       Living arrangements for the past 2 months: Single Family Home                                      Prior Living Arrangements/Services Living arrangements for the past 2 months: Single Family Home Lives with:: Self Patient language and need for interpreter reviewed:: Yes Do you feel safe going back to the place where you live?: Yes            Criminal Activity/Legal Involvement Pertinent to Current Situation/Hospitalization: No - Comment as needed  Activities of Daily Living Home Assistive Devices/Equipment: Eyeglasses ADL Screening (condition at time of admission) Patient's cognitive ability adequate to safely complete daily activities?: Yes Is the patient deaf or have difficulty hearing?: No Does the  patient have difficulty seeing, even when wearing glasses/contacts?: No Does the patient have difficulty concentrating, remembering, or making decisions?: No Patient able to express need for assistance with ADLs?: Yes Does the patient have difficulty dressing or bathing?: No Independently performs ADLs?: Yes (appropriate for developmental age) Does the patient have difficulty walking or climbing stairs?: No Weakness of Legs: None Weakness of Arms/Hands: None  Permission Sought/Granted                  Emotional Assessment Appearance:: Appears  stated age Attitude/Demeanor/Rapport: Engaged Affect (typically observed): Calm Orientation: : Oriented to Place, Oriented to Self, Oriented to  Time, Oriented to Situation Alcohol / Substance Use: Alcohol Use, Tobacco Use Psych Involvement: No (comment)  Admission diagnosis:  GI bleed [K92.2] Upper GI bleed [D74.1] Alcoholic cirrhosis of liver with ascites ([place]) [K70.31] AKI (acute kidney injury) ([place]) [N17.9] Symptomatic anemia [D64.9] Aspiration pneumonia of left upper lobe, unspecified aspiration pneumonia type ([place]) [J69.0] Patient Active Problem List   Diagnosis Date Noted   Upper GI bleed 08/22/2020   Hypokalemia 08/22/2020   Hyponatremia 08/22/2020   AKI (acute kidney injury) ([place]) 28/78/6767   Alcoholic cirrhosis of liver with ascites ([place]) 08/22/2020   Acute blood loss anemia 08/22/2020   Acute hypoxemic respiratory failure ([place]) 08/22/2020   Aspiration pneumonia ([place]) 08/22/2020   GI bleed 08/22/2020   Lymphadenopathy 02/16/2019   Orthostatic hypotension 10/08/2018   Thrombocytopenia ([place]) 10/08/2018   Transaminasemia    Syncope 10/07/2018   Idiopathic hypotension    Elevated liver enzymes 01/25/2014   Loss of weight 01/25/2014   TOBACCO ABUSE 08/13/2006   ALLERGIC RHINITIS 08/13/2006   PCP:  Scheryl Marten, PA Pharmacy:   [facility] [address], [place] - [address] [id number] [place] [address] Phone: [phone] Fax: [phone]     Social Determinants of Health (SDOH) Interventions    Readmission Risk Interventions No flowsheet data found.

## 2020-08-23 NOTE — Progress Notes (Signed)
[facility]astroenterology Progress Note  Eric Dennis 55 y.o. 09/17/1965  CC: Anemia  Subjective: Patient is lethargic but denies abdominal pain, nausea, vomiting. Reports pain in right hand from IV.  Has not had a BM.  ROS : Review of Systems  Cardiovascular:  Negative for chest pain and palpitations.  Gastrointestinal:  Negative for abdominal pain, blood in stool, constipation, diarrhea, heartburn, melena, nausea and vomiting.    Objective: Vital signs in last 24 hours: Vitals:   08/23/20 0502 08/23/20 0800  BP: (!) 121/56   Pulse: 95   Resp: 20   Temp: 98.5 F (36.9 C) 98.6 F (37 C)  SpO2: 94%     Physical Exam:  General:  Lethargic but oriented, no acute distress  Head:  Normocephalic, without obvious abnormality, atraumatic  Mouth: Clot and fresh blood in left lower extraction site  Eyes:  Scleral icterus, EOMs intact  Lungs:   Clear to auscultation bilaterally, respirations unlabored  Heart:  Regular rate and rhythm, S1, S2 normal  Abdomen:   Soft, mildly distended, non-tender, bowel sounds active all four quadrants  Extremities: Extremities normal, atraumatic, no  edema; SCDs in place    Lab Results: Recent Labs    08/22/20 1031 08/22/20 1356  NA 131*  --   K 3.3*  --   CL 89*  --   CO2 28  --   GLUCOSE 110*  --   BUN 28*  --   CREATININE 2.06*  --   CALCIUM 8.7*  --   MG 1.7 1.5*  PHOS 5.8* 5.7*   Recent Labs    08/22/20 1031  AST 81*  ALT 41  ALKPHOS 94  BILITOT 13.4*  PROT 6.9  ALBUMIN 1.8*   Recent Labs    08/22/20 1031 08/22/20 1356 08/22/20 2231  WBC 11.7*  12.5* 10.9* 9.0  NEUTROABS 8.5*  --   --   HGB 4.3*  4.4* 4.4* 5.3*  HCT 13.0*  11.9* 11.1* 14.7*  MCV 146.1*  143.4* 144.2* 124.6*  PLT 88*  98* 72* 71*   Recent Labs    08/22/20 1031  LABPROT 32.0*  INR 3.1*     Assessment: Anemia, unclear if this is due to recent dental extraction vs. Portal hypertensive gastropathy vs. Variceal bleeding. -Hgb 5.3,  inappropriate rise from 4.4 s/p 3u pRBCs, 4th unit transfusing -Post-transfusion Hgb pending, repeat CMP and PT/INR pending  Alcohol related cirrhosis: MELD Na score of 37 (65-66% 30-day mortality) -T bili 13.4/AST 81/ALT 41/ALP 94 as of 6/16 -INR 3.1 as of 6/16 -Platelets 71K/uL as of 6/16   AKI: BUN 28/ Cr 2.06 as of 6/16  Plan: Consult dentistry/oral surgery for recommendations/treatment for ongoing bleeding from extraction site (likely related to elevated INR).  Consider FFP, pending INR.  Continue to monitor CBC with transfusion as needed to maintain Hgb >7.  Continue Protonix and octreotide.  Plan for EGD tomorrow, if INR <2 and Hgb >7.  [facility] will follow.  Salley Slaughter PA-C 08/23/2020, 9:53 AM  Contact #  [phone]

## 2020-08-23 NOTE — Progress Notes (Addendum)
PROGRESS NOTE  Eric Dennis KPT:[id number] DOB: 1965/04/10   PCP: Scheryl Marten, PA  Patient is from: Home.  Lives with his wife.  DOA: 08/22/2020 LOS: 1  Chief complaints: Abnormal labs  Brief Narrative / Interim history: 55 year old M with PMH of alcoholic cirrhosis, alcohol abuse and tobacco use disorder directed to ED for low hemoglobin to 4.9.  Had melena, abdominal pain and distention for 2 to 3 weeks.  Also had bleeding from recent dental extraction.  Patient was admitted for decompensated liver failure and acute blood loss anemia with supratherapeutic INR, hyperbilirubinemia to 13.4 and acute hypoxic respiratory failure with concern for possible pneumonia.  Transfused 2 units with appropriate response.  Received vitamin K.  GI consulted.  Started on IV antibiotics, IV PPI and octreotide  Subjective: Seen and examined earlier this morning.  No major events overnight of this morning.  Per GI, bleeding from dental extraction site although I didn't see active bleed on my exam.  Patient has no complaints.  He is awake but not alert.  He is fully oriented.  Patient's wife at bedside.  He denies chest pain, shortness of breath, GI or UTI symptoms.  Objective: Vitals:   08/23/20 0502 08/23/20 0800 08/23/20 0900 08/23/20 1000  BP: (!) 121/56 (!) 114/55 (!) 112/55 (!) 111/49  Pulse: 95 94 96 96  Resp: 20 (!) 21 20   Temp: 98.5 F (36.9 C) 98.6 F (37 C)    TempSrc: Axillary Axillary    SpO2: 94% 92% 92% 95%  Weight:      Height:        Intake/Output Summary (Last 24 hours) at 08/23/2020 1110 Last data filed at 08/23/2020 1000 Gross per 24 hour  Intake 4130.01 ml  Output 800 ml  Net 3330.01 ml   Filed Weights   08/22/20 1018  Weight: 81.2 kg    Examination:  GENERAL: No apparent distress.  Nontoxic. HEENT: Poor dentition. Small flakes of old blood but no active bleed.  Vision and hearing grossly intact.  NECK: Supple.  No apparent JVD.  RESP: 94% on 3 L.  No IWOB.   Fair aeration bilaterally. CVS:  RRR. Heart sounds normal.  ABD/GI/GU: BS+. Abd soft, NTND.  MSK/EXT:  Moves extremities. No apparent deformity. No edema.  SKIN: no apparent skin lesion or wound NEURO: Awake but not alert.  Oriented x4.  No apparent focal neuro deficit. PSYCH: Calm. Normal affect.  Procedures:  None  Microbiology summarized: [id number] and influenza PCR nonreactive. MRSA PCR screen negative. Blood cultures NGTD.  Assessment & Plan: Acute blood loss anemia likely due to upper GI bleed in the setting of supratherapeutic INR and patient with history of alcoholic cirrhosis.  Also had bleeding from recent dental extraction. Recent Labs    08/22/20 1031 08/22/20 1356 08/22/20 2231 08/23/20 0926  HGB 4.3*  4.4* 4.4* 5.3* 7.0*  -Transfused 2 units with appropriate response. -Monitor H&H every 6 hours -INR improved.  Agree with IV vitamin K 10 mg x 1 per GI. -Continue PPI and octreotide per GI -SCD for VT prophylaxis -Plan for EGD by GI.    Acute hypoxemic respiratory failure in the setting of possible aspiration pneumonia: Desaturated to 89% and recovered with 2 L by [place].  CXR with bibasilar streaky opacities.  Pro-Cal 0.35. -Wean oxygen as able -Continue IV Unasyn  Decompensated alcoholic liver cirrhosis with ascites-low suspicion for SBP based on exam Hyperbilirubinemia/elevated AST-total bili elevated to 17 Coagulopathy-likely due to alcohol and cirrhosis. Recent Labs  Lab 08/22/20 1031 08/23/20 0926  AST 81* 66*  ALT 41 33  ALKPHOS 94 71  BILITOT 13.4* 17.0*  PROT 6.9 6.0*  ALBUMIN 1.8* 1.9*  -Continue CIWA with as needed Ativan -Agree with IV vitamin K for coagulopathy -Follow abdominal ultrasound -Agree with IR paracentesis -We will give him 1 FFP -Check ammonia. -Continue monitoring -Continue vitamins   AKI/azotemia: Resolved. Recent Labs    08/22/20 1031 08/23/20 0926  BUN 28* 18  CREATININE 2.06* 1.02  -Continue monitoring -Avoid  nephrotoxic meds  Lactic acidosis/hypotension: Hypotension improved.  Lactic acidosis is multifactorial -Recheck lactic acid   Hyponatremia/hypokalemia/hypochloremia/hypomagnesemia: Likely due to alcohol.  Resolved.  -Monitor as needed   Macrocytosis: Likely due to alcohol.  O96 and folic acid within normal. -Encouraged alcohol cessation   Thrombocytopenia: Likely due to alcohol and cirrhosis. -Continue monitoring     Tobacco abuse:  -Encourage cessation. -We will add nicotine patch  Addendum Dental caries with recent dental extraction: Reportedly bleeding from extraction site since extraction. Some flakes of old blood with no active or profuse bleeding on my exam -Will have dental way in -Correct coagulopathy as above.    Body mass index is 23.62 kg/m.         DVT prophylaxis:  SCDs Start: 08/22/20 1218  Code Status: Full code Family Communication: Patient and/or RN.  Updated patient's wife at bedside. Level of care: Stepdown Status is: Inpatient  Remains inpatient appropriate because:Hemodynamically unstable, Ongoing diagnostic testing needed not appropriate for outpatient work up, IV treatments appropriate due to intensity of illness or inability to take PO, and Inpatient level of care appropriate due to severity of illness  Dispo: The patient is from: Home              Anticipated d/c is to: Home              Patient currently is not medically stable to d/c.   Difficult to place patient No       Consultants:  Gastroenterology   Sch Meds:  Scheduled Meds:  sodium chloride   Intravenous Once   Chlorhexidine Gluconate Cloth  6 each Topical Daily   folic acid  1 mg Intravenous Daily   lactulose  30 g Oral TID   LORazepam  0-4 mg Intravenous Q4H   Followed by   Derrill Memo ON 08/24/2020] LORazepam  0-4 mg Intravenous Q8H   mouth rinse  15 mL Mouth Rinse BID   multivitamin  15 mL Oral Daily   nicotine  21 mg Transdermal Daily   thiamine injection  100 mg  Intravenous Daily   Continuous Infusions:  sodium chloride Stopped (08/22/20 1219)   albumin human 25 g (08/23/20 0958)   ampicillin-sulbactam (UNASYN) IV Stopped (08/23/20 2952)   octreotide  (SANDOSTATIN)    IV infusion 50 mcg/hr (08/23/20 1000)   pantoprazole 8 mg/hr (08/23/20 1000)   phytonadione (VITAMIN K) IV     PRN Meds:.acetaminophen **OR** acetaminophen, lip balm, LORazepam **OR** LORazepam, ondansetron **OR** ondansetron (ZOFRAN) IV  Antimicrobials: Anti-infectives (From admission, onward)    Start     Dose/Rate Route Frequency Ordered Stop   08/22/20 1800  Ampicillin-Sulbactam (UNASYN) 3 g in sodium chloride 0.9 % 100 mL IVPB        3 g 200 mL/hr over 30 Minutes Intravenous Every 6 hours 08/22/20 1302     08/22/20 1200  Ampicillin-Sulbactam (UNASYN) 3 g in sodium chloride 0.9 % 100 mL IVPB        3 g  200 mL/hr over 30 Minutes Intravenous  Once 08/22/20 1158 08/22/20 1351        I have personally reviewed the following labs and images: CBC: Recent Labs  Lab 08/22/20 1031 08/22/20 1356 08/22/20 2231 08/23/20 0926  WBC 11.7*  12.5* 10.9* 9.0 9.1  NEUTROABS 8.5*  --   --   --   HGB 4.3*  4.4* 4.4* 5.3* 7.0*  HCT 13.0*  11.9* 11.1* 14.7* 19.4*  MCV 146.1*  143.4* 144.2* 124.6* 109.0*  PLT 88*  98* 72* 71* 82*   BMP &GFR Recent Labs  Lab 08/22/20 1031 08/22/20 1356 08/23/20 0926  NA 131*  --  135  K 3.3*  --  3.8  CL 89*  --  97*  CO2 28  --  31  GLUCOSE 110*  --  95  BUN 28*  --  18  CREATININE 2.06*  --  1.02  CALCIUM 8.7*  --  8.3*  MG 1.7 1.5* 1.9  PHOS 5.8* 5.7* 4.1   Estimated Creatinine Clearance: 91.4 mL/min (by C-G formula based on SCr of 1.02 mg/dL). Liver & Pancreas: Recent Labs  Lab 08/22/20 1031 08/23/20 0926  AST 81* 66*  ALT 41 33  ALKPHOS 94 71  BILITOT 13.4* 17.0*  PROT 6.9 6.0*  ALBUMIN 1.8* 1.9*   No results for input(s): LIPASE, AMYLASE in the last 168 hours. No results for input(s): AMMONIA in the last 168  hours. Diabetic: No results for input(s): HGBA1C in the last 72 hours. No results for input(s): GLUCAP in the last 168 hours. Cardiac Enzymes: No results for input(s): CKTOTAL, CKMB, CKMBINDEX, TROPONINI in the last 168 hours. No results for input(s): PROBNP in the last 8760 hours. Coagulation Profile: Recent Labs  Lab 08/22/20 1031 08/23/20 0926  INR 3.1* 2.6*   Thyroid Function Tests: No results for input(s): TSH, T4TOTAL, FREET4, T3FREE, THYROIDAB in the last 72 hours. Lipid Profile: No results for input(s): CHOL, HDL, LDLCALC, TRIG, CHOLHDL, LDLDIRECT in the last 72 hours. Anemia Panel: Recent Labs    08/22/20 1356  VITAMINB12 2,262*  FOLATE 11.3  FERRITIN 3,372*  TIBC 119*  IRON 119   Urine analysis:    Component Value Date/Time   COLORURINE AMBER (A) 10/07/2018 1615   APPEARANCEUR HAZY (A) 10/07/2018 1615   LABSPEC 1.025 10/07/2018 1615   PHURINE 6.0 10/07/2018 1615   GLUCOSEU NEGATIVE 10/07/2018 1615   HGBUR NEGATIVE 10/07/2018 1615   BILIRUBINUR MODERATE (A) 10/07/2018 1615   [place] 10/07/2018 1615   PROTEINUR NEGATIVE 10/07/2018 1615   NITRITE NEGATIVE 10/07/2018 1615   LEUKOCYTESUR NEGATIVE 10/07/2018 1615   Sepsis Labs: Invalid input(s): PROCALCITONIN, [place]  Microbiology: Recent Results (from the past 240 hour(s))  Resp Panel by RT-PCR (Flu A&B, Covid) Nasopharyngeal Swab     Status: None   Collection Time: 08/22/20 10:55 AM   Specimen: Nasopharyngeal Swab; Nasopharyngeal(NP) swabs in vial transport medium  Result Value Ref Range Status   SARS Coronavirus 2 by RT PCR NEGATIVE NEGATIVE Final    Comment: (NOTE) SARS-CoV-2 target nucleic acids are NOT DETECTED.  The SARS-CoV-2 RNA is generally detectable in upper respiratory specimens during the acute phase of infection. The lowest concentration of SARS-CoV-2 viral copies this assay can detect is 138 copies/mL. A negative result does not preclude SARS-Cov-2 infection and should  not be used as the sole basis for treatment or other patient management decisions. A negative result may occur with  improper specimen collection/handling, submission of specimen other than nasopharyngeal swab,  presence of viral mutation(s) within the areas targeted by this assay, and inadequate number of viral copies(<138 copies/mL). A negative result must be combined with clinical observations, patient history, and epidemiological information. The expected result is Negative.  Fact Sheet for Patients:  EntrepreneurPulse.com.au  Fact Sheet for Healthcare Providers:  IncredibleEmployment.be  This test is no t yet approved or cleared by the [place] FDA and  has been authorized for detection and/or diagnosis of SARS-CoV-2 by FDA under an Emergency Use Authorization (EUA). This EUA will remain  in effect (meaning this test can be used) for the duration of the COVID-19 declaration under Section 564(b)(1) of the Act, 21 U.S.C.section 360bbb-3(b)(1), unless the authorization is terminated  or revoked sooner.       Influenza A by PCR NEGATIVE NEGATIVE Final   Influenza B by PCR NEGATIVE NEGATIVE Final    Comment: (NOTE) The Xpert Xpress SARS-CoV-2/FLU/RSV plus assay is intended as an aid in the diagnosis of influenza from Nasopharyngeal swab specimens and should not be used as a sole basis for treatment. Nasal washings and aspirates are unacceptable for Xpert Xpress SARS-CoV-2/FLU/RSV testing.  Fact Sheet for Patients: EntrepreneurPulse.com.au  Fact Sheet for Healthcare Providers: IncredibleEmployment.be  This test is not yet approved or cleared by the [place] FDA and has been authorized for detection and/or diagnosis of SARS-CoV-2 by FDA under an Emergency Use Authorization (EUA). This EUA will remain in effect (meaning this test can be used) for the duration of the COVID-19 declaration under  Section 564(b)(1) of the Act, 21 U.S.C. section 360bbb-3(b)(1), unless the authorization is terminated or revoked.  Performed at [facility], [place] [address], [place], [place] [address]   Culture, blood (routine x 2)     Status: None (Preliminary result)   Collection Time: 08/22/20 12:04 PM   Specimen: BLOOD LEFT FOREARM  Result Value Ref Range Status   Specimen Description   Final    BLOOD LEFT FOREARM Performed at [facility], [place] [address] [place], [place] [address]    Special Requests   Final    BOTTLES DRAWN AEROBIC AND ANAEROBIC Blood Culture adequate volume Performed at [place] [address], [place], [place] [address]    Culture   Final    NO GROWTH < 24 HOURS Performed at [place] [address] [place], [place] [address]    Report Status PENDING  Incomplete  Culture, blood (routine x 2)     Status: None (Preliminary result)   Collection Time: 08/22/20 12:05 PM   Specimen: BLOOD RIGHT HAND  Result Value Ref Range Status   Specimen Description   Final    BLOOD RIGHT HAND Performed at [place] [address], [place], [place] [address]    Special Requests   Final    BOTTLES DRAWN AEROBIC AND ANAEROBIC Blood Culture adequate volume Performed at [place] [address] [place], [place] [address]    Culture   Final    NO GROWTH < 24 HOURS Performed at [place] [address], [place], [place] [address]    Report Status PENDING  Incomplete  MRSA PCR Screening     Status: None   Collection Time: 08/22/20  1:40 PM  Result Value Ref Range Status   MRSA by PCR NEGATIVE NEGATIVE Final    Comment:        The GeneXpert MRSA Assay (FDA approved for NASAL specimens only), is  one component of a comprehensive MRSA colonization surveillance program. It is not intended to diagnose MRSA infection nor to guide or monitor treatment for MRSA  infections. Performed at [facility], [place] [address] [place], [place] [address]     Radiology Studies: No results found.    Pammie Chirino T. Worthing  If 7PM-7AM, please contact night-coverage [email or web address] 08/23/2020, 11:10 AM

## 2020-08-24 ENCOUNTER — Inpatient Hospital Stay (HOSPITAL_COMMUNITY): Payer: BC Managed Care – PPO

## 2020-08-24 LAB — AMMONIA: Ammonia: 58 umol/L — ABNORMAL HIGH (ref 9–35)

## 2020-08-24 LAB — HEMOGLOBIN AND HEMATOCRIT, BLOOD
HCT: 22.3 % — ABNORMAL LOW (ref 39.0–52.0)
Hemoglobin: 7.7 g/dL — ABNORMAL LOW (ref 13.0–17.0)

## 2020-08-24 LAB — COMPREHENSIVE METABOLIC PANEL
ALT: 31 U/L (ref 0–44)
AST: 56 U/L — ABNORMAL HIGH (ref 15–41)
Albumin: 2.5 g/dL — ABNORMAL LOW (ref 3.5–5.0)
Alkaline Phosphatase: 65 U/L (ref 38–126)
Anion gap: 6 (ref 5–15)
BUN: 11 mg/dL (ref 6–20)
CO2: 30 mmol/L (ref 22–32)
Calcium: 8.5 mg/dL — ABNORMAL LOW (ref 8.9–10.3)
Chloride: 98 mmol/L (ref 98–111)
Creatinine, Ser: 0.58 mg/dL — ABNORMAL LOW (ref 0.61–1.24)
GFR, Estimated: >60 mL/min (ref >60)
Glucose, Bld: 125 mg/dL — ABNORMAL HIGH (ref 70–99)
Potassium: 3.9 mmol/L (ref 3.5–5.1)
Sodium: 134 mmol/L — ABNORMAL LOW (ref 135–145)
Total Bilirubin: 18.7 mg/dL (ref 0.3–1.2)
Total Protein: 6.6 g/dL (ref 6.5–8.1)

## 2020-08-24 LAB — CBC
HCT: 19.7 % — ABNORMAL LOW (ref 39.0–52.0)
Hemoglobin: 6.8 g/dL — CL (ref 13.0–17.0)
MCH: 38.6 pg — ABNORMAL HIGH (ref 26.0–34.0)
MCHC: 34.5 g/dL (ref 30.0–36.0)
MCV: 111.9 fL — ABNORMAL HIGH (ref 80.0–100.0)
Platelets: 76 10*3/uL — ABNORMAL LOW (ref 150–400)
RBC: 1.76 MIL/uL — ABNORMAL LOW (ref 4.22–5.81)
WBC: 8.7 10*3/uL (ref 4.0–10.5)
nRBC: 0 % (ref 0.0–0.2)

## 2020-08-24 LAB — MAGNESIUM: Magnesium: 1.7 mg/dL (ref 1.7–2.4)

## 2020-08-24 LAB — PROTIME-INR
INR: 2.7 — ABNORMAL HIGH (ref 0.8–1.2)
INR: 2.4 — ABNORMAL HIGH (ref 0.8–1.2)
Prothrombin Time: 29 seconds — ABNORMAL HIGH (ref 11.4–15.2)
Prothrombin Time: 26 seconds — ABNORMAL HIGH (ref 11.4–15.2)

## 2020-08-24 LAB — APTT: aPTT: 49 seconds — ABNORMAL HIGH (ref 24–36)

## 2020-08-24 LAB — PROCALCITONIN: Procalcitonin: <0.1 ng/mL

## 2020-08-24 LAB — PHOSPHORUS: Phosphorus: 2.7 mg/dL (ref 2.5–4.6)

## 2020-08-24 LAB — PREPARE RBC (CROSSMATCH)

## 2020-08-24 LAB — LACTIC ACID, PLASMA: Lactic Acid, Venous: 2 mmol/L (ref 0.5–1.9)

## 2020-08-24 MED ORDER — SODIUM CHLORIDE 0.9% IV SOLUTION: Freq: Once | INTRAVENOUS | Status: AC

## 2020-08-24 MED ORDER — VITAMIN K1 10 MG/ML IJ SOLN
10.0000 mg | Freq: Once | INTRAVENOUS | Status: AC
  Administered 2020-08-24: 10 mg via INTRAVENOUS
  Filled 2020-08-24: qty 1

## 2020-08-24 MED ORDER — FUROSEMIDE 10 MG/ML IJ SOLN
40.0000 mg | Freq: Once | INTRAMUSCULAR | Status: AC
  Administered 2020-08-24: 40 mg via INTRAVENOUS
  Filled 2020-08-24: qty 4

## 2020-08-24 MED ORDER — LACTULOSE 10 GM/15ML PO SOLN
20.0000 g | Freq: Two times a day (BID) | ORAL | Status: DC
  Administered 2020-08-24 – 2020-08-27 (×7): 20 g via ORAL
  Filled 2020-08-24 (×7): qty 30

## 2020-08-24 MED ORDER — LIDOCAINE HCL (PF) 1 % IJ SOLN
INTRAMUSCULAR | Status: AC
  Filled 2020-08-24: qty 5

## 2020-08-24 MED ORDER — DOXYCYCLINE HYCLATE 100 MG PO TABS
100.0000 mg | ORAL_TABLET | Freq: Two times a day (BID) | ORAL | Status: DC
  Administered 2020-08-24 – 2020-08-27 (×6): 100 mg via ORAL
  Filled 2020-08-24 (×6): qty 1

## 2020-08-24 MED ORDER — SODIUM CHLORIDE 0.9% IV SOLUTION: Freq: Once | INTRAVENOUS | Status: DC

## 2020-08-24 NOTE — Progress Notes (Signed)
PROGRESS NOTE  Eric Dennis RJJ:[id number] DOB: [birth year]   PCP: Scheryl Marten, PA  Patient is from: Home.  Lives with his wife.  DOA: 08/22/2020 LOS: 2  Chief complaints: Abnormal labs  Brief Narrative / Interim history: 55 year old M with PMH of alcoholic cirrhosis, alcohol abuse and tobacco use disorder directed to ED for low hemoglobin to 4.9.  Had melena, abdominal pain and distention for 2 to 3 weeks.  Also had bleeding from recent dental extraction.  Patient was admitted for decompensated liver failure and acute blood loss anemia with supratherapeutic INR, hyperbilirubinemia to 13.4 and acute hypoxic respiratory failure with concern for possible pneumonia.  Transfused 2 units with appropriate response.  Received vitamin K.  GI consulted.  Started on IV antibiotics, IV PPI and octreotide  Subjective: Seen and examined earlier this morning.  No major events overnight of this morning.  No complaints other than pain from IV site and cough.  He denies chest pain, shortness of breath, nausea, vomiting or abdominal pain.  About 6 bowel movement in the last 24 hours.  No report of blood in stool.  Objective: Vitals:   08/24/20 0653 08/24/20 0700 08/24/20 0800 08/24/20 0900  BP:  131/71 130/60 133/63  Pulse: 92 86 88 94  Resp: (!) 23 (!) 24 (!) 27 (!) 27  Temp: 98.2 F (36.8 C) 98.2 F (36.8 C) 97.9 F (36.6 C)   TempSrc: Oral Oral Oral   SpO2: 96% 93% 93% 94%  Weight:      Height:        Intake/Output Summary (Last 24 hours) at 08/24/2020 1204 Last data filed at 08/24/2020 1000 Gross per 24 hour  Intake 2640.5 ml  Output 1175 ml  Net 1465.5 ml   Filed Weights   08/22/20 1018  Weight: 81.2 kg    Examination:  GENERAL: No apparent distress.  Nontoxic. HEENT: Poor dentition. Some fresh red blood in his mouth NECK: Supple.  No apparent JVD.  RESP:  No IWOB.  Fair aeration bilaterally. CVS:  RRR. Heart sounds normal.  ABD/GI/GU: BS+.  Abdomen is slightly  distended.  Nontender. MSK/EXT:  Moves extremities. No apparent deformity. No edema.  SKIN: no apparent skin lesion or wound NEURO: Awake.  More alert.  Oriented x4.  No apparent focal neuro deficit. PSYCH: Calm. Normal affect.   Procedures:  None  Microbiology summarized: [id number] and influenza PCR nonreactive. MRSA PCR screen negative. Blood cultures NGTD.  Assessment & Plan: Acute blood loss anemia likely due to upper GI bleed in the setting of supratherapeutic INR and patient with history of alcoholic cirrhosis.  Also had bleeding from recent dental extraction. Recent Labs    08/22/20 1031 08/22/20 1356 08/22/20 2231 08/23/20 0926 08/24/20 0254 08/24/20 0849  HGB 4.3*  4.4* 4.4* 5.3* 7.0* 6.8* 7.7*  -Transfused 3 units with appropriate response. -Monitor H&H every 6 hours -INR 2.7.  IV vitamin K 10 mg x 1 and FFP x1 -Continue PPI and octreotide per GI -SCD for VTE prophylaxis -Plan for EGD by GI once INR acceptable.  Decompensated alcoholic liver cirrhosis with ascites-low suspicion for SBP based on exam Hyperbilirubinemia/elevated AST-total bili elevated increased to 18.7 today. Coagulopathy-likely due to alcohol and cirrhosis. Mild hepatic encephalopathy: More alert today.  Ammonia elevated but improving. Recent Labs  Lab 08/22/20 1031 08/23/20 0926 08/24/20 0254  AST 81* 66* 56*  ALT 41 33 31  ALKPHOS 94 71 65  BILITOT 13.4* 17.0* 18.7*  PROT 6.9 6.0* 6.6  ALBUMIN 1.8*  1.9* 2.5*  -Continue CIWA with as needed Ativan -IV vitamin K and FFP as above. -Follow abdominal ultrasound -Continue lactulose -Continue monitoring -Continue thiamine, folic acid and multivitamins  Acute hypoxemic respiratory failure in the setting of possible aspiration pneumonia: Desaturated to 89% and recovered with 2 L by [place].  CXR with bibasilar streaky opacities.  Pro-Cal 0.35. -Wean oxygen as able -Continue IV Unasyn   AKI/azotemia: Resolved. Recent Labs    08/22/20 1031  08/23/20 0926 08/24/20 0254  BUN 28* 18 11  CREATININE 2.06* 1.02 0.58*  -Continue monitoring -Avoid nephrotoxic meds  Lactic acidosis/hypotension: Hypotension improved.  Lactic acidosis is multifactorial -Recheck lactic acid   Hyponatremia/hypokalemia/hypochloremia/hypomagnesemia: Likely due to alcohol.  -Monitor and replenish as appropriate.   Macrocytosis: Likely due to alcohol.  G81 and folic acid within normal. -Encouraged alcohol cessation   Thrombocytopenia: Likely due to alcohol and cirrhosis. -Continue monitoring     Tobacco abuse:  -Encourage cessation. -We will add nicotine patch  Dental caries with recent dental extraction: Some fresh blood in his mouth likely oozing from recent dental extraction likely due to coagulopathy and thrombocytopenia. -Correct coagulopathy as above.    Body mass index is 23.62 kg/m.         DVT prophylaxis:  SCDs Start: 08/22/20 1218  Code Status: Full code Family Communication: Updated patient's wife at bedside on 6/17.  None at bedside today Level of care: Stepdown Status is: Inpatient  Remains inpatient appropriate because:Hemodynamically unstable, Ongoing diagnostic testing needed not appropriate for outpatient work up, IV treatments appropriate due to intensity of illness or inability to take PO, and Inpatient level of care appropriate due to severity of illness  Dispo: The patient is from: Home              Anticipated d/c is to: Home              Patient currently is not medically stable to d/c.   Difficult to place patient No       Consultants:  Gastroenterology   Sch Meds:  Scheduled Meds:  sodium chloride   Intravenous Once   sodium chloride   Intravenous Once   sodium chloride   Intravenous Once   Chlorhexidine Gluconate Cloth  6 each Topical Daily   folic acid  1 mg Intravenous Daily   lactulose  20 g Oral BID   LORazepam  0-4 mg Intravenous Q4H   Followed by   LORazepam  0-4 mg Intravenous Q8H    mouth rinse  15 mL Mouth Rinse BID   multivitamin  15 mL Oral Daily   nicotine  21 mg Transdermal Daily   thiamine injection  100 mg Intravenous Daily   Continuous Infusions:  sodium chloride Stopped (08/22/20 1219)   sodium chloride Stopped (08/24/20 0412)   ampicillin-sulbactam (UNASYN) IV 3 g (08/24/20 1155)   octreotide  (SANDOSTATIN)    IV infusion 50 mcg/hr (08/24/20 1159)   pantoprazole 8 mg/hr (08/24/20 1159)   PRN Meds:.sodium chloride, acetaminophen **OR** acetaminophen, lip balm, LORazepam **OR** LORazepam, ondansetron **OR** ondansetron (ZOFRAN) IV  Antimicrobials: Anti-infectives (From admission, onward)    Start     Dose/Rate Route Frequency Ordered Stop   08/22/20 1800  Ampicillin-Sulbactam (UNASYN) 3 g in sodium chloride 0.9 % 100 mL IVPB        3 g 200 mL/hr over 30 Minutes Intravenous Every 6 hours 08/22/20 1302     08/22/20 1200  Ampicillin-Sulbactam (UNASYN) 3 g in sodium chloride 0.9 % 100 mL  IVPB        3 g 200 mL/hr over 30 Minutes Intravenous  Once 08/22/20 1158 08/22/20 1351        I have personally reviewed the following labs and images: CBC: Recent Labs  Lab 08/22/20 1031 08/22/20 1356 08/22/20 2231 08/23/20 0926 08/24/20 0254 08/24/20 0849  WBC 11.7*  12.5* 10.9* 9.0 9.1 8.7  --   NEUTROABS 8.5*  --   --   --   --   --   HGB 4.3*  4.4* 4.4* 5.3* 7.0* 6.8* 7.7*  HCT 13.0*  11.9* 11.1* 14.7* 19.4* 19.7* 22.3*  MCV 146.1*  143.4* 144.2* 124.6* 109.0* 111.9*  --   PLT 88*  98* 72* 71* 82* 76*  --    BMP &GFR Recent Labs  Lab 08/22/20 1031 08/22/20 1356 08/23/20 0926 08/24/20 0254  NA 131*  --  135 134*  K 3.3*  --  3.8 3.9  CL 89*  --  97* 98  CO2 28  --  31 30  GLUCOSE 110*  --  95 125*  BUN 28*  --  18 11  CREATININE 2.06*  --  1.02 0.58*  CALCIUM 8.7*  --  8.3* 8.5*  MG 1.7 1.5* 1.9 1.7  PHOS 5.8* 5.7* 4.1 2.7   Estimated Creatinine Clearance: 116.5 mL/min (A) (by C-G formula based on SCr of 0.58 mg/dL (L)). Liver &  Pancreas: Recent Labs  Lab 08/22/20 1031 08/23/20 0926 08/24/20 0254  AST 81* 66* 56*  ALT 41 33 31  ALKPHOS 94 71 65  BILITOT 13.4* 17.0* 18.7*  PROT 6.9 6.0* 6.6  ALBUMIN 1.8* 1.9* 2.5*   No results for input(s): LIPASE, AMYLASE in the last 168 hours. Recent Labs  Lab 08/23/20 1222 08/24/20 0254  AMMONIA 65* 58*   Diabetic: No results for input(s): HGBA1C in the last 72 hours. No results for input(s): GLUCAP in the last 168 hours. Cardiac Enzymes: No results for input(s): CKTOTAL, CKMB, CKMBINDEX, TROPONINI in the last 168 hours. No results for input(s): PROBNP in the last 8760 hours. Coagulation Profile: Recent Labs  Lab 08/22/20 1031 08/23/20 0926 08/24/20 0254  INR 3.1* 2.6* 2.7*   Thyroid Function Tests: No results for input(s): TSH, T4TOTAL, FREET4, T3FREE, THYROIDAB in the last 72 hours. Lipid Profile: No results for input(s): CHOL, HDL, LDLCALC, TRIG, CHOLHDL, LDLDIRECT in the last 72 hours. Anemia Panel: Recent Labs    08/22/20 1356  VITAMINB12 2,262*  FOLATE 11.3  FERRITIN 3,372*  TIBC 119*  IRON 119   Urine analysis:    Component Value Date/Time   COLORURINE AMBER (A) 10/07/2018 1615   APPEARANCEUR HAZY (A) 10/07/2018 1615   LABSPEC 1.025 10/07/2018 1615   PHURINE 6.0 10/07/2018 1615   GLUCOSEU NEGATIVE 10/07/2018 1615   HGBUR NEGATIVE 10/07/2018 1615   BILIRUBINUR MODERATE (A) 10/07/2018 1615   [place] 10/07/2018 1615   PROTEINUR NEGATIVE 10/07/2018 1615   NITRITE NEGATIVE 10/07/2018 1615   LEUKOCYTESUR NEGATIVE 10/07/2018 1615   Sepsis Labs: Invalid input(s): PROCALCITONIN, [place]  Microbiology: Recent Results (from the past 240 hour(s))  Resp Panel by RT-PCR (Flu A&B, Covid) Nasopharyngeal Swab     Status: None   Collection Time: 08/22/20 10:55 AM   Specimen: Nasopharyngeal Swab; Nasopharyngeal(NP) swabs in vial transport medium  Result Value Ref Range Status   SARS Coronavirus 2 by RT PCR NEGATIVE NEGATIVE Final     Comment: (NOTE) SARS-CoV-2 target nucleic acids are NOT DETECTED.  The SARS-CoV-2 RNA is generally detectable  in upper respiratory specimens during the acute phase of infection. The lowest concentration of SARS-CoV-2 viral copies this assay can detect is 138 copies/mL. A negative result does not preclude SARS-Cov-2 infection and should not be used as the sole basis for treatment or other patient management decisions. A negative result may occur with  improper specimen collection/handling, submission of specimen other than nasopharyngeal swab, presence of viral mutation(s) within the areas targeted by this assay, and inadequate number of viral copies(<138 copies/mL). A negative result must be combined with clinical observations, patient history, and epidemiological information. The expected result is Negative.  Fact Sheet for Patients:  EntrepreneurPulse.com.au  Fact Sheet for Healthcare Providers:  IncredibleEmployment.be  This test is no t yet approved or cleared by the [place] FDA and  has been authorized for detection and/or diagnosis of SARS-CoV-2 by FDA under an Emergency Use Authorization (EUA). This EUA will remain  in effect (meaning this test can be used) for the duration of the COVID-19 declaration under Section 564(b)(1) of the Act, 21 U.S.C.section 360bbb-3(b)(1), unless the authorization is terminated  or revoked sooner.       Influenza A by PCR NEGATIVE NEGATIVE Final   Influenza B by PCR NEGATIVE NEGATIVE Final    Comment: (NOTE) The Xpert Xpress SARS-CoV-2/FLU/RSV plus assay is intended as an aid in the diagnosis of influenza from Nasopharyngeal swab specimens and should not be used as a sole basis for treatment. Nasal washings and aspirates are unacceptable for Xpert Xpress SARS-CoV-2/FLU/RSV testing.  Fact Sheet for Patients: EntrepreneurPulse.com.au  Fact Sheet for Healthcare  Providers: IncredibleEmployment.be  This test is not yet approved or cleared by the [place] FDA and has been authorized for detection and/or diagnosis of SARS-CoV-2 by FDA under an Emergency Use Authorization (EUA). This EUA will remain in effect (meaning this test can be used) for the duration of the COVID-19 declaration under Section 564(b)(1) of the Act, 21 U.S.C. section 360bbb-3(b)(1), unless the authorization is terminated or revoked.  Performed at [facility], [place] [address] [place], [place] [address]   Culture, blood (routine x 2)     Status: None (Preliminary result)   Collection Time: 08/22/20 12:04 PM   Specimen: BLOOD LEFT FOREARM  Result Value Ref Range Status   Specimen Description   Final    BLOOD LEFT FOREARM Performed at [facility], [place] [address] [place], [place] [address]    Special Requests   Final    BOTTLES DRAWN AEROBIC AND ANAEROBIC Blood Culture adequate volume Performed at [facility] [address], [place], [place] [address]    Culture   Final    NO GROWTH < 24 HOURS Performed at [facility] [address], [place], [place] [address]    Report Status PENDING  Incomplete  Culture, blood (routine x 2)     Status: None (Preliminary result)   Collection Time: 08/22/20 12:05 PM   Specimen: BLOOD RIGHT HAND  Result Value Ref Range Status   Specimen Description   Final    BLOOD RIGHT HAND Performed at [facility] [address], [place], [place] [address]    Special Requests   Final    BOTTLES DRAWN AEROBIC AND ANAEROBIC Blood Culture adequate volume Performed at [facility] [address], [place], [place] [address]    Culture   Final    NO GROWTH < 24 HOURS Performed at [facility] [address]  [place], [place] [address]    Report Status PENDING  Incomplete  MRSA PCR Screening     Status: None   Collection Time:  08/22/20  1:40 PM  Result Value Ref Range Status   MRSA by PCR NEGATIVE NEGATIVE Final    Comment:        The GeneXpert MRSA Assay (FDA approved for NASAL specimens only), is one component of a comprehensive MRSA colonization surveillance program. It is not intended to diagnose MRSA infection nor to guide or monitor treatment for MRSA infections. Performed at [facility], [place] [address], [place], [place] [address]     Radiology Studies: No results found.    Benicio Manna T. [place]  If 7PM-7AM, please contact night-coverage [email or web address] 08/24/2020, 12:04 PM

## 2020-08-24 NOTE — Progress Notes (Signed)
Pt with increasing cough today compared to yesterday with oxygen requirements also increasing.  This PM a significantly larger amount of blood noted oozing from mouth. RN concerned that pt may be aspirating on blood from previous tooth extraction site. Dr. Cyndia Skeeters called and updated. CXR done, Oral surgeon to come and stitch the site.

## 2020-08-24 NOTE — Progress Notes (Addendum)
Subjective: Patient examined in presence of his wife at bedside. He appears more awake than yesterday and complains of having 6 liquid bowel movements today morning. As per his nurse the stools are not black or bloody. He denies abdominal pain. He complains of cough.  Objective: Vital signs in last 24 hours: Temp:  [97.6 F (36.4 C)-99 F (37.2 C)] 97.9 F (36.6 C) (06/18 0800) Pulse Rate:  [83-96] 94 (06/18 0900) Resp:  [16-27] 27 (06/18 0900) BP: (103-133)/(49-87) 133/63 (06/18 0900) SpO2:  [93 %-100 %] 94 % (06/18 0900) Weight change:  Last BM Date: 08/23/20  PE: Deeply icteric, on oxygen via nasal cannula, prominent pallor GENERAL: Able to speak in full sentences, oriented x3  ABDOMEN: Distended but not tense, umbilical hernia, nontender EXTREMITIES: Bipedal pitting edema  Lab Results: Results for orders placed or performed during the hospital encounter of 08/22/20 (from the past 48 hour(s))  CBC with Differential     Status: Abnormal   Collection Time: 08/22/20 10:31 AM  Result Value Ref Range   WBC 12.5 (H) 4.0 - 10.5 K/uL   RBC 0.83 (L) 4.22 - 5.81 MIL/uL   Hemoglobin 4.4 (LL) 13.0 - 17.0 g/dL    Comment: REPEATED TO VERIFY THIS CRITICAL RESULT HAS VERIFIED AND BEEN CALLED TO GARRISON G. BY JOLANDE MECIAL ON 06 16 2022 AT 1151, AND HAS BEEN READ BACK.     HCT 11.9 (L) 39.0 - 52.0 %   MCV 143.4 (H) 80.0 - 100.0 fL   MCH 53.0 (H) 26.0 - 34.0 pg   MCHC 37.0 (H) 30.0 - 36.0 g/dL   RDW 18.5 (H) 11.5 - 15.5 %   Platelets 98 (L) 150 - 400 K/uL    Comment: SPECIMEN CHECKED FOR CLOTS Immature Platelet Fraction may be clinically indicated, consider ordering this additional test [id number] PLATELET COUNT CONFIRMED BY SMEAR    nRBC 0.0 0.0 - 0.2 %   Neutrophils Relative % 68 %   Neutro Abs 8.5 (H) 1.7 - 7.7 K/uL   Lymphocytes Relative 16 %   Lymphs Abs 1.9 0.7 - 4.0 K/uL   Monocytes Relative 13 %   Monocytes Absolute 1.7 (H) 0.1 - 1.0 K/uL   Eosinophils Relative 2 %    Eosinophils Absolute 0.2 0.0 - 0.5 K/uL   Basophils Relative 0 %   Basophils Absolute 0.0 0.0 - 0.1 K/uL   Immature Granulocytes 1 %   Abs Immature Granulocytes 0.15 (H) 0.00 - 0.07 K/uL   Polychromasia PRESENT     Comment: Performed at [facility], [place] [address], [place], [place] [address]  Comprehensive metabolic panel     Status: Abnormal   Collection Time: 08/22/20 10:31 AM  Result Value Ref Range   Sodium 131 (L) 135 - 145 mmol/L   Potassium 3.3 (L) 3.5 - 5.1 mmol/L   Chloride 89 (L) 98 - 111 mmol/L   CO2 28 22 - 32 mmol/L   Glucose, Bld 110 (H) 70 - 99 mg/dL    Comment: Glucose reference range applies only to samples taken after fasting for at least 8 hours.   BUN 28 (H) 6 - 20 mg/dL   Creatinine, Ser 2.06 (H) 0.61 - 1.24 mg/dL   Calcium 8.7 (L) 8.9 - 10.3 mg/dL   Total Protein 6.9 6.5 - 8.1 g/dL   Albumin 1.8 (L) 3.5 - 5.0 g/dL   AST 81 (H) 15 - 41 U/L   ALT 41 0 - 44 U/L   Alkaline Phosphatase 94 38 -  126 U/L   Total Bilirubin 13.4 (H) 0.3 - 1.2 mg/dL   GFR, Estimated 37 (L) >60 mL/min    Comment: (NOTE) Calculated using the CKD-EPI Creatinine Equation (2021)    Anion gap 14 5 - 15    Comment: Performed at [facility], [place] [address] [place], [place] [address]  Protime-INR     Status: Abnormal   Collection Time: 08/22/20 10:31 AM  Result Value Ref Range   Prothrombin Time 32.0 (H) 11.4 - 15.2 seconds   INR 3.1 (H) 0.8 - 1.2    Comment: (NOTE) INR goal varies based on device and disease states. Performed at [facility], [place] [address] [place], [place] [address]   APTT     Status: Abnormal   Collection Time: 08/22/20 10:31 AM  Result Value Ref Range   aPTT 47 (H) 24 - 36 seconds    Comment:        IF BASELINE aPTT IS ELEVATED, SUGGEST PATIENT RISK ASSESSMENT BE USED TO DETERMINE APPROPRIATE ANTICOAGULANT THERAPY. Performed at [facility], [place] [address] [place], [place]  [address]   Magnesium     Status: None   Collection Time: 08/22/20 10:31 AM  Result Value Ref Range   Magnesium 1.7 1.7 - 2.4 mg/dL    Comment: Performed at [facility], [place] [address], [place], [place] [address]  Phosphorus     Status: Abnormal   Collection Time: 08/22/20 10:31 AM  Result Value Ref Range   Phosphorus 5.8 (H) 2.5 - 4.6 mg/dL    Comment: Performed at [facility], [place] [address] [place], [place] [address]  CBC     Status: Abnormal   Collection Time: 08/22/20 10:31 AM  Result Value Ref Range   WBC 11.7 (H) 4.0 - 10.5 K/uL   RBC 0.89 (L) 4.22 - 5.81 MIL/uL   Hemoglobin 4.3 (LL) 13.0 - 17.0 g/dL    Comment: CRITICAL VALUE NOTED.  VALUE IS CONSISTENT WITH PREVIOUSLY REPORTED AND CALLED VALUE. REPEATED TO VERIFY    HCT 13.0 (L) 39.0 - 52.0 %   MCV 146.1 (H) 80.0 - 100.0 fL   MCH 48.3 (H) 26.0 - 34.0 pg   MCHC 33.1 30.0 - 36.0 g/dL    Comment: CORRECTED FOR COLD AGGLUTININS   RDW 18.6 (H) 11.5 - 15.5 %   Platelets 88 (L) 150 - 400 K/uL    Comment: SPECIMEN CHECKED FOR CLOTS Immature Platelet Fraction may be clinically indicated, consider ordering this additional test [id number] CONSISTENT WITH PREVIOUS RESULT REPEATED TO VERIFY    nRBC 0.0 0.0 - 0.2 %    Comment: Performed at [facility], [place] [address] [place],  [address]  Type and screen [place]     Status: None (Preliminary result)   Collection Time: 08/22/20 10:52 AM  Result Value Ref Range   ABO/RH(D) A POS    Antibody Screen POS    Sample Expiration 08/25/2020,2359    Antibody Identification NO CLINICALLY SIGNIFICANT ANTIBODY IDENTIFIED    Unit Number [id number]    Blood Component Type RED CELLS,LR    Unit division 00    Status of Unit ISSUED,FINAL    Transfusion Status OK TO TRANSFUSE    Crossmatch Result COMPATIBLE    Unit Number [id number]    Blood Component Type RED CELLS,LR    Unit division 00    Status  of Unit ISSUED,FINAL    Transfusion Status OK TO  TRANSFUSE    Crossmatch Result COMPATIBLE    Unit Number [id number]    Blood Component Type RED CELLS,LR    Unit division 00    Status of Unit ISSUED    Transfusion Status OK TO TRANSFUSE    Crossmatch Result COMPATIBLE    Unit Number [id number]    Blood Component Type RED CELLS,LR    Unit division 00    Status of Unit ISSUED    Transfusion Status OK TO TRANSFUSE    Crossmatch Result COMPATIBLE    Unit Number [id number]    Blood Component Type RED CELLS,LR    Unit division 00    Status of Unit ISSUED    Transfusion Status OK TO TRANSFUSE    Crossmatch Result COMPATIBLE   Resp Panel by RT-PCR (Flu A&B, Covid) Nasopharyngeal Swab     Status: None   Collection Time: 08/22/20 10:55 AM   Specimen: Nasopharyngeal Swab; Nasopharyngeal(NP) swabs in vial transport medium  Result Value Ref Range   SARS Coronavirus 2 by RT PCR NEGATIVE NEGATIVE    Comment: (NOTE) SARS-CoV-2 target nucleic acids are NOT DETECTED.  The SARS-CoV-2 RNA is generally detectable in upper respiratory specimens during the acute phase of infection. The lowest concentration of SARS-CoV-2 viral copies this assay can detect is 138 copies/mL. A negative result does not preclude SARS-Cov-2 infection and should not be used as the sole basis for treatment or other patient management decisions. A negative result may occur with  improper specimen collection/handling, submission of specimen other than nasopharyngeal swab, presence of viral mutation(s) within the areas targeted by this assay, and inadequate number of viral copies(<138 copies/mL). A negative result must be combined with clinical observations, patient history, and epidemiological information. The expected result is Negative.  Fact Sheet for Patients:  EntrepreneurPulse.com.au  Fact Sheet for Healthcare Providers:  IncredibleEmployment.be  This test is no t yet  approved or cleared by the [place] FDA and  has been authorized for detection and/or diagnosis of SARS-CoV-2 by FDA under an Emergency Use Authorization (EUA). This EUA will remain  in effect (meaning this test can be used) for the duration of the COVID-19 declaration under Section 564(b)(1) of the Act, 21 U.S.C.section 360bbb-3(b)(1), unless the authorization is terminated  or revoked sooner.       Influenza A by PCR NEGATIVE NEGATIVE   Influenza B by PCR NEGATIVE NEGATIVE    Comment: (NOTE) The Xpert Xpress SARS-CoV-2/FLU/RSV plus assay is intended as an aid in the diagnosis of influenza from Nasopharyngeal swab specimens and should not be used as a sole basis for treatment. Nasal washings and aspirates are unacceptable for Xpert Xpress SARS-CoV-2/FLU/RSV testing.  Fact Sheet for Patients: EntrepreneurPulse.com.au  Fact Sheet for Healthcare Providers: IncredibleEmployment.be  This test is not yet approved or cleared by the [place] FDA and has been authorized for detection and/or diagnosis of SARS-CoV-2 by FDA under an Emergency Use Authorization (EUA). This EUA will remain in effect (meaning this test can be used) for the duration of the COVID-19 declaration under Section 564(b)(1) of the Act, 21 U.S.C. section 360bbb-3(b)(1), unless the authorization is terminated or revoked.  Performed at [facility], [place] [address] [place], [place] [address]   POC occult blood, ED Provider will collect     Status: Abnormal   Collection Time: 08/22/20 11:55 AM  Result Value Ref Range   Fecal Occult Bld POSITIVE (A) NEGATIVE  Culture, blood (routine x 2)     Status: None (Preliminary result)  Collection Time: 08/22/20 12:04 PM   Specimen: BLOOD LEFT FOREARM  Result Value Ref Range   Specimen Description      BLOOD LEFT FOREARM Performed at [facility], [place] [address] [place], [place] [address]    Special  Requests      BOTTLES DRAWN AEROBIC AND ANAEROBIC Blood Culture adequate volume Performed at [place] [address] [place], [place] [address]    Culture      NO GROWTH < 24 HOURS Performed at [place] [address] [place], [place] [address]    Report Status PENDING   Culture, blood (routine x 2)     Status: None (Preliminary result)   Collection Time: 08/22/20 12:05 PM   Specimen: BLOOD RIGHT HAND  Result Value Ref Range   Specimen Description      BLOOD RIGHT HAND Performed at [place] [address], [place], [place] [address]    Special Requests      BOTTLES DRAWN AEROBIC AND ANAEROBIC Blood Culture adequate volume Performed at [place] [address] [place], [place] [address]    Culture      NO GROWTH < 24 HOURS Performed at [place] [address], [place], [place] [address]    Report Status PENDING   Lactic acid, plasma     Status: Abnormal   Collection Time: 08/22/20 12:24 PM  Result Value Ref Range   Lactic Acid, Venous 4.5 (HH) 0.5 - 1.9 mmol/L    Comment: CRITICAL RESULT CALLED TO, READ BACK BY AND VERIFIED WITH: Marcy Panning RN @ 1315 08/22/20 KELLY, J. Performed at [facility], [place] [address] [place], [place] [address]   ABO/Rh     Status: None   Collection Time: 08/22/20 12:35 PM  Result Value Ref Range   ABO/RH(D)      A POS Performed at [facility], [place] [address] [place], [place] [address]   MRSA PCR Screening     Status: None   Collection Time: 08/22/20  1:40 PM  Result Value Ref Range   MRSA by PCR NEGATIVE NEGATIVE    Comment:        The GeneXpert MRSA Assay (FDA approved for NASAL specimens only), is one component of a comprehensive MRSA colonization surveillance program. It is not intended to diagnose MRSA infection nor to guide or monitor treatment for MRSA infections. Performed at [facility], [place] [address], [place], [place] [address]   Prepare RBC (crossmatch)     Status: None   Collection Time: 08/22/20  1:56 PM  Result Value Ref Range   Order Confirmation      ORDER PROCESSED BY BLOOD BANK Performed at [place] [address] [place], [place] [address]   Lactic acid, plasma     Status: Abnormal   Collection Time: 08/22/20  1:56 PM  Result Value Ref Range   Lactic Acid, Venous 4.4 (HH) 0.5 - 1.9 mmol/L    Comment: CRITICAL RESULT CALLED TO, READ BACK BY AND VERIFIED WITH: FIELDS,O AT 1447 ON 08/22/2020 BY MOSLEY,J Performed at [facility], [place] [address], [place], [place] [address]   HIV Antibody (routine testing w rflx)     Status: None   Collection Time: 08/22/20  1:56 PM  Result Value Ref Range   HIV Screen 4th Generation wRfx Non Reactive Non Reactive    Comment: Performed at  [facility], [place] [address] [place], [place] [address]  CBC     Status: Abnormal   Collection Time: 08/22/20  1:56 PM  Result Value Ref Range   WBC 10.9 (H) 4.0 - 10.5 K/uL   RBC 0.77 (L) 4.22 - 5.81 MIL/uL   Hemoglobin 4.4 (LL) 13.0 - 17.0 g/dL    Comment: CRITICAL VALUE NOTED.  VALUE IS CONSISTENT WITH PREVIOUSLY REPORTED AND CALLED VALUE. REPEATED TO VERIFY THIS CRITICAL RESULT HAS VERIFIED AND BEEN CALLED TO [facility] BY MARINDA BLACK ON 06 16 2022 AT 1501, AND HAS BEEN READ BACK. [facility]    HCT 11.1 (L) 39.0 - 52.0 %   MCV 144.2 (H) 80.0 - 100.0 fL   MCH 57.1 (H) 26.0 - 34.0 pg   MCHC 39.6 (H) 30.0 - 36.0 g/dL   RDW 19.4 (H) 11.5 - 15.5 %   Platelets 72 (L) 150 - 400 K/uL    Comment: SPECIMEN CHECKED FOR CLOTS Immature Platelet Fraction may be clinically indicated, consider ordering this additional test [id number] REPEATED TO VERIFY PLATELET COUNT CONFIRMED BY SMEAR    nRBC 0.0 0.0 - 0.2 %    Comment: Performed at [facility], [place] [address] [place], [place] [address]  Magnesium      Status: Abnormal   Collection Time: 08/22/20  1:56 PM  Result Value Ref Range   Magnesium 1.5 (L) 1.7 - 2.4 mg/dL    Comment: Performed at [facility], [place] [address] [place], [place] [address]  Phosphorus     Status: Abnormal   Collection Time: 08/22/20  1:56 PM  Result Value Ref Range   Phosphorus 5.7 (H) 2.5 - 4.6 mg/dL    Comment: Performed at [facility], [place] [address] [place], [place] [address]  Folate, serum, performed at [facility] lab     Status: None   Collection Time: 08/22/20  1:56 PM  Result Value Ref Range   Folate 11.3 >5.9 ng/mL    Comment: Performed at [facility], [place] [address] [place] [address] [place], [place] [address]  Vitamin B12     Status: Abnormal   Collection Time: 08/22/20  1:56 PM  Result Value Ref Range   Vitamin B-12 2,262 (H) 180 - 914 pg/mL    Comment: RESULTS CONFIRMED BY MANUAL DILUTION (NOTE) This assay is not validated for testing neonatal or myeloproliferative syndrome specimens for Vitamin B12 levels. Performed at [facility], [place] [address] [place], [place] [address]   Iron and TIBC     Status: Abnormal   Collection Time: 08/22/20  1:56 PM  Result Value Ref Range   Iron 119 45 - 182 ug/dL   TIBC 119 (L) 250 - 450 ug/dL   Saturation Ratios 100 (H) 17.9 - 39.5 %   UIBC NOT CALCULATED ug/dL    Comment: Performed at [facility], [place] [address], [place], [place] [address]  Ferritin     Status: Abnormal   Collection Time: 08/22/20  1:56 PM  Result Value Ref Range   Ferritin 3,372 (H) 24 - 336 ng/mL    Comment: RESULT CONFIRMED BY AUTOMATED DILUTION Performed at [facility], [place] [address] [place], [place] [address]   Procalcitonin - Baseline     Status: None   Collection Time: 08/22/20  1:56 PM  Result Value Ref Range   Procalcitonin 0.35 ng/mL    Comment:        Interpretation: PCT (Procalcitonin) <=  0.5 ng/mL: Systemic  infection (sepsis) is not likely. Local bacterial infection is possible. (NOTE)       Sepsis PCT Algorithm           Lower Respiratory Tract                                      Infection PCT Algorithm    ----------------------------     ----------------------------         PCT < 0.25 ng/mL                PCT < 0.10 ng/mL          Strongly encourage             Strongly discourage   discontinuation of antibiotics    initiation of antibiotics    ----------------------------     -----------------------------       PCT 0.25 - 0.50 ng/mL            PCT 0.10 - 0.25 ng/mL               OR       >80% decrease in PCT            Discourage initiation of                                            antibiotics      Encourage discontinuation           of antibiotics    ----------------------------     -----------------------------         PCT >= 0.50 ng/mL              PCT 0.26 - 0.50 ng/mL               AND        <80% decrease in PCT             Encourage initiation of                                             antibiotics       Encourage continuation           of antibiotics    ----------------------------     -----------------------------        PCT >= 0.50 ng/mL                  PCT > 0.50 ng/mL               AND         increase in PCT                  Strongly encourage                                      initiation of antibiotics    Strongly encourage escalation           of antibiotics                                     -----------------------------  PCT <= 0.25 ng/mL                                                 OR                                        > 80% decrease in PCT                                      Discontinue / Do not initiate                                             antibiotics  Performed at [place] [address] [place], [place] [address]   CBC     Status: Abnormal   Collection Time:  08/22/20 10:31 PM  Result Value Ref Range   WBC 9.0 4.0 - 10.5 K/uL   RBC 1.18 (L) 4.22 - 5.81 MIL/uL   Hemoglobin 5.3 (LL) 13.0 - 17.0 g/dL   HCT 14.7 (L) 39.0 - 52.0 %   MCV 124.6 (H) 80.0 - 100.0 fL   MCH 44.9 (H) 26.0 - 34.0 pg   MCHC 36.1 (H) 30.0 - 36.0 g/dL   RDW Not Measured 11.5 - 15.5 %   Platelets 71 (L) 150 - 400 K/uL    Comment: Immature Platelet Fraction may be clinically indicated, consider ordering this additional test [id number]    nRBC 0.0 0.0 - 0.2 %    Comment: Performed at [facility], [place] [address], [place], [place] [address]  Prepare RBC (crossmatch)     Status: None   Collection Time: 08/23/20 12:22 AM  Result Value Ref Range   Order Confirmation      ORDER PROCESSED BY BLOOD BANK Performed at [place] [address] [place], [place] [address]   Protime-INR     Status: Abnormal   Collection Time: 08/23/20  9:26 AM  Result Value Ref Range   Prothrombin Time 28.1 (H) 11.4 - 15.2 seconds   INR 2.6 (H) 0.8 - 1.2    Comment: (NOTE) INR goal varies based on device and disease states. Performed at [facility], [place] [address] [place], [place] [address]   CBC     Status: Abnormal   Collection Time: 08/23/20  9:26 AM  Result Value Ref Range   WBC 9.1 4.0 - 10.5 K/uL   RBC 1.78 (L) 4.22 - 5.81 MIL/uL   Hemoglobin 7.0 (L) 13.0 - 17.0 g/dL    Comment: POST TRANSFUSION SPECIMEN   HCT 19.4 (L) 39.0 - 52.0 %   MCV 109.0 (H) 80.0 - 100.0 fL    Comment: POST TRANSFUSION SPECIMEN   MCH 39.3 (H) 26.0 - 34.0 pg   MCHC 36.1 (H) 30.0 - 36.0 g/dL   RDW Not Measured 11.5 - 15.5 %   Platelets 82 (L) 150 - 400 K/uL    Comment: Immature Platelet Fraction may be clinically indicated, consider ordering this additional test [id number] CONSISTENT WITH PREVIOUS RESULT    nRBC  0.0 0.0 - 0.2 %    Comment: Performed at [facility], [place] [address], [place], [place] [address]  Comprehensive  metabolic panel     Status: Abnormal   Collection Time: 08/23/20  9:26 AM  Result Value Ref Range   Sodium 135 135 - 145 mmol/L   Potassium 3.8 3.5 - 5.1 mmol/L   Chloride 97 (L) 98 - 111 mmol/L   CO2 31 22 - 32 mmol/L   Glucose, Bld 95 70 - 99 mg/dL    Comment: Glucose reference range applies only to samples taken after fasting for at least 8 hours.   BUN 18 6 - 20 mg/dL   Creatinine, Ser 1.02 0.61 - 1.24 mg/dL    Comment: DELTA CHECK NOTED   Calcium 8.3 (L) 8.9 - 10.3 mg/dL   Total Protein 6.0 (L) 6.5 - 8.1 g/dL   Albumin 1.9 (L) 3.5 - 5.0 g/dL   AST 66 (H) 15 - 41 U/L   ALT 33 0 - 44 U/L   Alkaline Phosphatase 71 38 - 126 U/L   Total Bilirubin 17.0 (H) 0.3 - 1.2 mg/dL   GFR, Estimated >60 >60 mL/min    Comment: (NOTE) Calculated using the CKD-EPI Creatinine Equation (2021)    Anion gap 7 5 - 15    Comment: Performed at [facility], [place] [address] [place], [place] [address]  APTT     Status: Abnormal   Collection Time: 08/23/20  9:26 AM  Result Value Ref Range   aPTT 47 (H) 24 - 36 seconds    Comment:        IF BASELINE aPTT IS ELEVATED, SUGGEST PATIENT RISK ASSESSMENT BE USED TO DETERMINE APPROPRIATE ANTICOAGULANT THERAPY. Performed at [facility], [place] [address], [place], [place] [address]   Magnesium     Status: None   Collection Time: 08/23/20  9:26 AM  Result Value Ref Range   Magnesium 1.9 1.7 - 2.4 mg/dL    Comment: Performed at [facility], [place] [address] [place], [place] [address]  Phosphorus     Status: None   Collection Time: 08/23/20  9:26 AM  Result Value Ref Range   Phosphorus 4.1 2.5 - 4.6 mg/dL    Comment: Performed at [facility], [place] [address], [place], [place] [address]  Lactic acid, plasma     Status: Abnormal   Collection Time: 08/23/20 12:22 PM  Result Value Ref Range   Lactic Acid, Venous 2.1 (HH) 0.5 - 1.9 mmol/L    Comment: CRITICAL VALUE NOTED.  VALUE IS  CONSISTENT WITH PREVIOUSLY REPORTED AND CALLED VALUE. Performed at [facility], [place] [address] [place], [place] [address]   Ammonia     Status: Abnormal   Collection Time: 08/23/20 12:22 PM  Result Value Ref Range   Ammonia 65 (H) 9 - 35 umol/L    Comment: Performed at [facility], [place] [address], [place],  [address]  Prepare fresh frozen plasma     Status: None (Preliminary result)   Collection Time: 08/23/20 12:22 PM  Result Value Ref Range   Unit Number [id number]    Blood Component Type THAWED PLASMA    Unit division 00    Status of Unit ISSUED    Transfusion Status      OK TO TRANSFUSE Performed at [place] [address], [place], [place] [address]   Lactic acid, plasma  Status: Abnormal   Collection Time: 08/23/20  2:49 PM  Result Value Ref Range   Lactic Acid, Venous 3.3 (HH) 0.5 - 1.9 mmol/L    Comment: CRITICAL VALUE NOTED.  VALUE IS CONSISTENT WITH PREVIOUSLY REPORTED AND CALLED VALUE. Performed at [facility], [place] [address], [place], [place] [address]   Protime-INR     Status: Abnormal   Collection Time: 08/24/20  2:54 AM  Result Value Ref Range   Prothrombin Time 29.0 (H) 11.4 - 15.2 seconds   INR 2.7 (H) 0.8 - 1.2    Comment: (NOTE) INR goal varies based on device and disease states. Performed at [facility], [place] [address] [place], [place] [address]   Comprehensive metabolic panel     Status: Abnormal   Collection Time: 08/24/20  2:54 AM  Result Value Ref Range   Sodium 134 (L) 135 - 145 mmol/L   Potassium 3.9 3.5 - 5.1 mmol/L   Chloride 98 98 - 111 mmol/L   CO2 30 22 - 32 mmol/L   Glucose, Bld 125 (H) 70 - 99 mg/dL    Comment: Glucose reference range applies only to samples taken after fasting for at least 8 hours.   BUN 11 6 - 20 mg/dL   Creatinine, Ser 0.58 (L) 0.61 - 1.24 mg/dL   Calcium 8.5 (L) 8.9 - 10.3 mg/dL   Total Protein 6.6  6.5 - 8.1 g/dL   Albumin 2.5 (L) 3.5 - 5.0 g/dL   AST 56 (H) 15 - 41 U/L   ALT 31 0 - 44 U/L   Alkaline Phosphatase 65 38 - 126 U/L   Total Bilirubin 18.7 (HH) 0.3 - 1.2 mg/dL    Comment: CRITICAL RESULT CALLED TO, READ BACK BY AND VERIFIED WITH: KELLY GARCIA AT 0348 ON 08/24/20 BY MAJ    GFR, Estimated >60 >60 mL/min    Comment: (NOTE) Calculated using the CKD-EPI Creatinine Equation (2021)    Anion gap 6 5 - 15    Comment: Performed at [facility], [place] [address], [place], [place] [address]  Magnesium     Status: None   Collection Time: 08/24/20  2:54 AM  Result Value Ref Range   Magnesium 1.7 1.7 - 2.4 mg/dL    Comment: Performed at [facility], [place] [address], [place], [place] [address]  Phosphorus     Status: None   Collection Time: 08/24/20  2:54 AM  Result Value Ref Range   Phosphorus 2.7 2.5 - 4.6 mg/dL    Comment: Performed at [facility], [place] [address], [place], [place] [address]  Ammonia     Status: Abnormal   Collection Time: 08/24/20  2:54 AM  Result Value Ref Range   Ammonia 58 (H) 9 - 35 umol/L    Comment: Performed at [facility], [place] [address] [place], [place] [address]  CBC     Status: Abnormal   Collection Time: 08/24/20  2:54 AM  Result Value Ref Range   WBC 8.7 4.0 - 10.5 K/uL   RBC 1.76 (L) 4.22 - 5.81 MIL/uL   Hemoglobin 6.8 (LL) 13.0 - 17.0 g/dL    Comment: This critical result has verified and been called to [facility] by SEEL,MOLLY on 06 18 2022 at 0337, and has been read back.    HCT 19.7 (L) 39.0 - 52.0 %   MCV 111.9 (H) 80.0 - 100.0 fL   MCH 38.6 (H) 26.0 - 34.0 pg  MCHC 34.5 30.0 - 36.0 g/dL   RDW Not Measured 11.5 - 15.5 %   Platelets 76 (L) 150 - 400 K/uL    Comment: Immature Platelet Fraction may be clinically indicated, consider ordering this additional test [id number] CONSISTENT WITH PREVIOUS RESULT    nRBC 0.0 0.0 - 0.2 %    Comment: Performed at [facility], [place] [address] [place], [place] [address]  APTT     Status: Abnormal   Collection Time: 08/24/20  2:54 AM  Result Value Ref Range   aPTT 49 (H) 24 - 36 seconds    Comment:        IF BASELINE aPTT IS ELEVATED, SUGGEST PATIENT RISK ASSESSMENT BE USED TO DETERMINE APPROPRIATE ANTICOAGULANT THERAPY. Performed at [facility], [place] [address] [place], [place] [address]   Prepare RBC (crossmatch)     Status: None   Collection Time: 08/24/20  3:51 AM  Result Value Ref Range   Order Confirmation      ORDER PROCESSED BY BLOOD BANK Performed at [facility], [place] [address] [place], [place] [address]   Hemoglobin and hematocrit, blood     Status: Abnormal   Collection Time: 08/24/20  8:49 AM  Result Value Ref Range   Hemoglobin 7.7 (L) 13.0 - 17.0 g/dL   HCT 22.3 (L) 39.0 - 52.0 %    Comment: Performed at [facility], [place] [address], [place], [place] [address]    Studies/Results: DG Chest Port 1 View  Result Date: 08/22/2020 CLINICAL DATA:  Shortness of breath, hypoxia. EXAM: PORTABLE CHEST 1 VIEW COMPARISON:  October 07, 2018. FINDINGS: Borderline enlargement of the cardiac silhouette. Streaky left basilar opacities. No visible pleural effusions or pneumothorax. No acute osseous abnormality. IMPRESSION: Streaky left basilar opacities, which could represent atelectasis, aspiration, and/or pneumonia. Electronically Signed   By: Margaretha Sheffield MD   On: 08/22/2020 10:59    Medications: I have reviewed the patient's current medications.  Assessment: Decompensated alcoholic cirrhosis with ongoing alcohol use Severe anemia, recent tooth pulled with bleeding from left lower gums Dark stools, possible melena  Coagulopathy, patient given vitamin K 10 mg x 1 dose Somnolence but no frank encephalopathy  Suspected aspiration pneumonia on IV Unasyn  Plan: Hemoglobin has improved to 7.7, however INR remains  elevated at 2.7, with elevated PT of 29 EGD canceled for today, rescheduled for tomorrow depending upon INR  We will start patient on clear liquids, keep n.p.o. post midnight. Will reduce dosage of lactulose  Will stop IV albumin, renal function has normalized, some of the anemia may be hemodilutional from IV albumin  Worsening jaundice, total bilirubin 18.7, AST 56, ALT 31, ALP 65  Macrocytosis related to alcohol use, on folic acid, thiamine, vitamin B12  Guarded prognosis, continue Protonix drip and octreotide drip until we are able to evaluate for portal hypertensive related changes with an endoscopy.    Eric Juniper, MD 08/24/2020, 9:59 AM

## 2020-08-25 ENCOUNTER — Inpatient Hospital Stay (HOSPITAL_COMMUNITY): Payer: BC Managed Care – PPO

## 2020-08-25 ENCOUNTER — Encounter (HOSPITAL_COMMUNITY)
Admission: EM | Disposition: A | Discharge status: Home / Self Care | Payer: Self-pay | Source: Home / Self Care | Attending: Student

## 2020-08-25 LAB — BPAM RBC
Blood Product Expiration Date: 202207032359
Blood Product Expiration Date: 202207032359
Blood Product Expiration Date: 202207032359
Blood Product Expiration Date: 202207072359
Blood Product Expiration Date: 202207072359
ISSUE DATE / TIME: 202206161412
ISSUE DATE / TIME: 202206161812
ISSUE DATE / TIME: 202206170150
ISSUE DATE / TIME: 202206170441
ISSUE DATE / TIME: 202206180511
Unit Type and Rh: 6200
Unit Type and Rh: 6200
Unit Type and Rh: 6200
Unit Type and Rh: 6200
Unit Type and Rh: 6200

## 2020-08-25 LAB — TYPE AND SCREEN
ABO/RH(D): A POS
Antibody Screen: POSITIVE
Unit division: 0
Unit division: 0
Unit division: 0
Unit division: 0
Unit division: 0

## 2020-08-25 LAB — CBC
HCT: 21.9 % — ABNORMAL LOW (ref 39.0–52.0)
HCT: 22.2 % — ABNORMAL LOW (ref 39.0–52.0)
HCT: 19.6 % — ABNORMAL LOW (ref 39.0–52.0)
Hemoglobin: 7.5 g/dL — ABNORMAL LOW (ref 13.0–17.0)
Hemoglobin: 7.9 g/dL — ABNORMAL LOW (ref 13.0–17.0)
Hemoglobin: 7.3 g/dL — ABNORMAL LOW (ref 13.0–17.0)
MCH: 36.2 pg — ABNORMAL HIGH (ref 26.0–34.0)
MCH: 38.9 pg — ABNORMAL HIGH (ref 26.0–34.0)
MCH: 42 pg — ABNORMAL HIGH (ref 26.0–34.0)
MCHC: 34.2 g/dL (ref 30.0–36.0)
MCHC: 35.6 g/dL (ref 30.0–36.0)
MCHC: 37.2 g/dL — ABNORMAL HIGH (ref 30.0–36.0)
MCV: 105.8 fL — ABNORMAL HIGH (ref 80.0–100.0)
MCV: 109.4 fL — ABNORMAL HIGH (ref 80.0–100.0)
MCV: 112.6 fL — ABNORMAL HIGH (ref 80.0–100.0)
Platelets: 74 10*3/uL — ABNORMAL LOW (ref 150–400)
Platelets: 71 10*3/uL — ABNORMAL LOW (ref 150–400)
Platelets: 68 10*3/uL — ABNORMAL LOW (ref 150–400)
RBC: 2.07 MIL/uL — ABNORMAL LOW (ref 4.22–5.81)
RBC: 2.03 MIL/uL — ABNORMAL LOW (ref 4.22–5.81)
RBC: 1.74 MIL/uL — ABNORMAL LOW (ref 4.22–5.81)
WBC: 10.2 10*3/uL (ref 4.0–10.5)
WBC: 10.5 10*3/uL (ref 4.0–10.5)
WBC: 10.6 10*3/uL — ABNORMAL HIGH (ref 4.0–10.5)
nRBC: 0 % (ref 0.0–0.2)
nRBC: 0 % (ref 0.0–0.2)
nRBC: 0 % (ref 0.0–0.2)

## 2020-08-25 LAB — BPAM FFP
Blood Product Expiration Date: 202206222359
ISSUE DATE / TIME: 202206171348
Unit Type and Rh: 6200

## 2020-08-25 LAB — MAGNESIUM: Magnesium: 1.4 mg/dL — ABNORMAL LOW (ref 1.7–2.4)

## 2020-08-25 LAB — PHOSPHORUS: Phosphorus: 2.6 mg/dL (ref 2.5–4.6)

## 2020-08-25 LAB — COMPREHENSIVE METABOLIC PANEL
ALT: 29 U/L (ref 0–44)
AST: 61 U/L — ABNORMAL HIGH (ref 15–41)
Albumin: 2.4 g/dL — ABNORMAL LOW (ref 3.5–5.0)
Alkaline Phosphatase: 61 U/L (ref 38–126)
Anion gap: 6 (ref 5–15)
BUN: 9 mg/dL (ref 6–20)
CO2: 33 mmol/L — ABNORMAL HIGH (ref 22–32)
Calcium: 8.5 mg/dL — ABNORMAL LOW (ref 8.9–10.3)
Chloride: 96 mmol/L — ABNORMAL LOW (ref 98–111)
Creatinine, Ser: 0.35 mg/dL — ABNORMAL LOW (ref 0.61–1.24)
GFR, Estimated: >60 mL/min (ref >60)
Glucose, Bld: 113 mg/dL — ABNORMAL HIGH (ref 70–99)
Potassium: 4.5 mmol/L (ref 3.5–5.1)
Sodium: 135 mmol/L (ref 135–145)
Total Bilirubin: 18.6 mg/dL (ref 0.3–1.2)
Total Protein: 6.4 g/dL — ABNORMAL LOW (ref 6.5–8.1)

## 2020-08-25 LAB — PREPARE FRESH FROZEN PLASMA: Unit division: 0

## 2020-08-25 LAB — PROTIME-INR
INR: 2.8 — ABNORMAL HIGH (ref 0.8–1.2)
Prothrombin Time: 29.4 seconds — ABNORMAL HIGH (ref 11.4–15.2)

## 2020-08-25 LAB — PROCALCITONIN: Procalcitonin: 0.1 ng/mL

## 2020-08-25 LAB — APTT: aPTT: 43 seconds — ABNORMAL HIGH (ref 24–36)

## 2020-08-25 SURGERY — CANCELLED PROCEDURE
Anesthesia: Monitor Anesthesia Care

## 2020-08-25 MED ORDER — VITAMIN K1 10 MG/ML IJ SOLN
10.0000 mg | Freq: Once | INTRAVENOUS | Status: AC
  Administered 2020-08-25: 10 mg via INTRAVENOUS
  Filled 2020-08-25: qty 1

## 2020-08-25 MED ORDER — MAGNESIUM SULFATE 2 GM/50ML IV SOLN
2.0000 g | Freq: Once | INTRAVENOUS | Status: AC
  Administered 2020-08-25: 2 g via INTRAVENOUS
  Filled 2020-08-25: qty 50

## 2020-08-25 MED ORDER — FUROSEMIDE 10 MG/ML IJ SOLN
40.0000 mg | Freq: Every day | INTRAMUSCULAR | Status: DC
  Administered 2020-08-25 – 2020-08-26 (×2): 40 mg via INTRAVENOUS
  Filled 2020-08-25 (×2): qty 4

## 2020-08-25 MED ORDER — IPRATROPIUM-ALBUTEROL 0.5-2.5 (3) MG/3ML IN SOLN
3.0000 mL | Freq: Four times a day (QID) | RESPIRATORY_TRACT | Status: DC | PRN
  Administered 2020-08-25 – 2020-08-26 (×2): 3 mL via RESPIRATORY_TRACT
  Filled 2020-08-25 (×2): qty 3

## 2020-08-25 MED ORDER — SODIUM CHLORIDE 0.9% IV SOLUTION: Freq: Once | INTRAVENOUS | Status: AC

## 2020-08-25 NOTE — Progress Notes (Signed)
Subjective: Patient was moved from oral cavity at the site of tooth extraction. As per documentation, his stools have been green and brown. Patient is now on oxygen 10 L via nasal cannula, due to suspected aspiration with blood in oral cavity.  Objective: Vital signs in last 24 hours: Temp:  [97.9 F (36.6 C)-100.6 F (38.1 C)] 98.8 F (37.1 C) (06/19 0400) Pulse Rate:  [88-108] 95 (06/19 0700) Resp:  [23-34] 25 (06/19 0700) BP: (118-155)/(48-77) 136/50 (06/19 0700) SpO2:  [90 %-99 %] 97 % (06/19 0700) Weight change:  Last BM Date: 08/24/20  PE: Lethargic but awakens easily GENERAL: Is oriented to time, place and person  ABDOMEN: Distended but not tense, normal active bowel sounds EXTREMITIES: Compression stockings in place, edema not readily appreciated  Lab Results: Results for orders placed or performed during the hospital encounter of 08/22/20 (from the past 48 hour(s))  Protime-INR     Status: Abnormal   Collection Time: 08/23/20  9:26 AM  Result Value Ref Range   Prothrombin Time 28.1 (H) 11.4 - 15.2 seconds   INR 2.6 (H) 0.8 - 1.2    Comment: (NOTE) INR goal varies based on device and disease states. Performed at [facility], [place] [address] [place], [place] [address]   CBC     Status: Abnormal   Collection Time: 08/23/20  9:26 AM  Result Value Ref Range   WBC 9.1 4.0 - 10.5 K/uL   RBC 1.78 (L) 4.22 - 5.81 MIL/uL   Hemoglobin 7.0 (L) 13.0 - 17.0 g/dL    Comment: POST TRANSFUSION SPECIMEN   HCT 19.4 (L) 39.0 - 52.0 %   MCV 109.0 (H) 80.0 - 100.0 fL    Comment: POST TRANSFUSION SPECIMEN   MCH 39.3 (H) 26.0 - 34.0 pg   MCHC 36.1 (H) 30.0 - 36.0 g/dL   RDW Not Measured 11.5 - 15.5 %   Platelets 82 (L) 150 - 400 K/uL    Comment: Immature Platelet Fraction may be clinically indicated, consider ordering this additional test [id number] CONSISTENT WITH PREVIOUS RESULT    nRBC 0.0 0.0 - 0.2 %    Comment: Performed at [facility], [place] [address] [place], [place] [address]  Comprehensive metabolic panel     Status: Abnormal   Collection Time: 08/23/20  9:26 AM  Result Value Ref Range   Sodium 135 135 - 145 mmol/L   Potassium 3.8 3.5 - 5.1 mmol/L   Chloride 97 (L) 98 - 111 mmol/L   CO2 31 22 - 32 mmol/L   Glucose, Bld 95 70 - 99 mg/dL    Comment: Glucose reference range applies only to samples taken after fasting for at least 8 hours.   BUN 18 6 - 20 mg/dL   Creatinine, Ser 1.02 0.61 - 1.24 mg/dL    Comment: DELTA CHECK NOTED   Calcium 8.3 (L) 8.9 - 10.3 mg/dL   Total Protein 6.0 (L) 6.5 - 8.1 g/dL   Albumin 1.9 (L) 3.5 - 5.0 g/dL   AST 66 (H) 15 - 41 U/L   ALT 33 0 - 44 U/L   Alkaline Phosphatase 71 38 - 126 U/L   Total Bilirubin 17.0 (H) 0.3 - 1.2 mg/dL   GFR, Estimated >60 >60 mL/min    Comment: (NOTE) Calculated using the CKD-EPI Creatinine Equation (2021)    Anion gap 7 5 - 15    Comment: Performed at [facility], [place] [address] [place],  [address]  APTT     Status: Abnormal   Collection Time: 08/23/20  9:26 AM  Result Value Ref Range   aPTT 47 (H) 24 - 36 seconds    Comment:        IF BASELINE aPTT IS ELEVATED, SUGGEST PATIENT RISK ASSESSMENT BE USED TO DETERMINE APPROPRIATE ANTICOAGULANT THERAPY. Performed at [facility], [place] [address] [place], [place] [address]   Magnesium     Status: None   Collection Time: 08/23/20  9:26 AM  Result Value Ref Range   Magnesium 1.9 1.7 - 2.4 mg/dL    Comment: Performed at [facility], [place] [address], [place], [place] [address]  Phosphorus     Status: None   Collection Time: 08/23/20  9:26 AM  Result Value Ref Range   Phosphorus 4.1 2.5 - 4.6 mg/dL    Comment: Performed at [facility], [place] [address] [place], [place] [address]  Lactic acid, plasma     Status: Abnormal   Collection Time: 08/23/20 12:22 PM  Result Value Ref Range   Lactic Acid, Venous 2.1  (HH) 0.5 - 1.9 mmol/L    Comment: CRITICAL VALUE NOTED.  VALUE IS CONSISTENT WITH PREVIOUSLY REPORTED AND CALLED VALUE. Performed at [facility], [place] [address], [place], [place] [address]   Ammonia     Status: Abnormal   Collection Time: 08/23/20 12:22 PM  Result Value Ref Range   Ammonia 65 (H) 9 - 35 umol/L    Comment: Performed at [facility], [place] [address] [place], [place] [address]  Prepare fresh frozen plasma     Status: None   Collection Time: 08/23/20 12:22 PM  Result Value Ref Range   Unit Number [id number]    Blood Component Type THAWED PLASMA    Unit division 00    Status of Unit ISSUED,FINAL    Transfusion Status      OK TO TRANSFUSE Performed at [place] [address] [place] [address], [place], [place] [address]   Lactic acid, plasma     Status: Abnormal   Collection Time: 08/23/20  2:49 PM  Result Value Ref Range   Lactic Acid, Venous 3.3 (HH) 0.5 - 1.9 mmol/L    Comment: CRITICAL VALUE NOTED.  VALUE IS CONSISTENT WITH PREVIOUSLY REPORTED AND CALLED VALUE. Performed at [facility], [place] [address], [place], [place] [address]   Protime-INR     Status: Abnormal   Collection Time: 08/24/20  2:54 AM  Result Value Ref Range   Prothrombin Time 29.0 (H) 11.4 - 15.2 seconds   INR 2.7 (H) 0.8 - 1.2    Comment: (NOTE) INR goal varies based on device and disease states. Performed at [facility], [place] [address] [place],  [address]   Comprehensive metabolic panel     Status: Abnormal   Collection Time: 08/24/20  2:54 AM  Result Value Ref Range   Sodium 134 (L) 135 - 145 mmol/L   Potassium 3.9 3.5 - 5.1 mmol/L   Chloride 98 98 - 111 mmol/L   CO2 30 22 - 32 mmol/L   Glucose, Bld 125 (H) 70 - 99 mg/dL    Comment: Glucose reference range applies only to samples taken after fasting for at least 8 hours.   BUN 11 6 - 20 mg/dL   Creatinine, Ser 0.58 (L) 0.61 - 1.24 mg/dL    Calcium 8.5 (L) 8.9 - 10.3 mg/dL   Total Protein 6.6 6.5 -  8.1 g/dL   Albumin 2.5 (L) 3.5 - 5.0 g/dL   AST 56 (H) 15 - 41 U/L   ALT 31 0 - 44 U/L   Alkaline Phosphatase 65 38 - 126 U/L   Total Bilirubin 18.7 (HH) 0.3 - 1.2 mg/dL    Comment: CRITICAL RESULT CALLED TO, READ BACK BY AND VERIFIED WITH: KELLY GARCIA AT 0348 ON 08/24/20 BY MAJ    GFR, Estimated >60 >60 mL/min    Comment: (NOTE) Calculated using the CKD-EPI Creatinine Equation (2021)    Anion gap 6 5 - 15    Comment: Performed at [facility], [place] [address], [place], [place] [address]  Magnesium     Status: None   Collection Time: 08/24/20  2:54 AM  Result Value Ref Range   Magnesium 1.7 1.7 - 2.4 mg/dL    Comment: Performed at [facility], [place] [address] [place], [place] [address]  Phosphorus     Status: None   Collection Time: 08/24/20  2:54 AM  Result Value Ref Range   Phosphorus 2.7 2.5 - 4.6 mg/dL    Comment: Performed at [facility], [place] [address], [place], [place] [address]  Ammonia     Status: Abnormal   Collection Time: 08/24/20  2:54 AM  Result Value Ref Range   Ammonia 58 (H) 9 - 35 umol/L    Comment: Performed at [facility], [place] [address] [place], [place] [address]  CBC     Status: Abnormal   Collection Time: 08/24/20  2:54 AM  Result Value Ref Range   WBC 8.7 4.0 - 10.5 K/uL   RBC 1.76 (L) 4.22 - 5.81 MIL/uL   Hemoglobin 6.8 (LL) 13.0 - 17.0 g/dL    Comment: This critical result has verified and been called to [facility] by SEEL,MOLLY on 06 18 2022 at 0337, and has been read back.    HCT 19.7 (L) 39.0 - 52.0 %   MCV 111.9 (H) 80.0 - 100.0 fL   MCH 38.6 (H) 26.0 - 34.0 pg   MCHC 34.5 30.0 - 36.0 g/dL   RDW Not Measured 11.5 - 15.5 %   Platelets 76 (L) 150 - 400 K/uL    Comment: Immature Platelet Fraction may be clinically indicated, consider ordering this additional test [id number] CONSISTENT WITH PREVIOUS RESULT     nRBC 0.0 0.0 - 0.2 %    Comment: Performed at [facility], [place] [address], [place], [place] [address]  APTT     Status: Abnormal   Collection Time: 08/24/20  2:54 AM  Result Value Ref Range   aPTT 49 (H) 24 - 36 seconds    Comment:        IF BASELINE aPTT IS ELEVATED, SUGGEST PATIENT RISK ASSESSMENT BE USED TO DETERMINE APPROPRIATE ANTICOAGULANT THERAPY. Performed at [facility], [place] [address] [place], [place] [address]   Prepare RBC (crossmatch)     Status: None   Collection Time: 08/24/20  3:51 AM  Result Value Ref Range   Order Confirmation      ORDER PROCESSED BY BLOOD BANK Performed at [facility], [place] [address] [place], [place] [address]   Hemoglobin and hematocrit, blood     Status: Abnormal   Collection Time: 08/24/20  8:49 AM  Result Value Ref Range   Hemoglobin 7.7 (L) 13.0 - 17.0 g/dL   HCT 22.3 (L) 39.0 - 52.0 %    Comment: Performed at  [facility], [place] [address] [place], [place] [address]  Prepare fresh frozen plasma     Status: None (Preliminary result)   Collection Time: 08/24/20 11:18 AM  Result Value Ref Range   Unit Number [id number]    Blood Component Type THAWED PLASMA    Unit division 00    Status of Unit ISSUED,FINAL    Transfusion Status      OK TO TRANSFUSE Performed at [place] [address] [place], [place] [address]    Unit Number [id number]    Blood Component Type THAWED PLASMA    Unit division 00    Status of Unit ALLOCATED    Transfusion Status OK TO TRANSFUSE   Lactic acid, plasma     Status: Abnormal   Collection Time: 08/24/20  6:09 PM  Result Value Ref Range   Lactic Acid, Venous 2.0 (HH) 0.5 - 1.9 mmol/L    Comment: CRITICAL VALUE NOTED.  VALUE IS CONSISTENT WITH PREVIOUSLY REPORTED AND CALLED VALUE. Performed at [facility], [place] [address] [place], [place] [address]   Protime-INR     Status:  Abnormal   Collection Time: 08/24/20  6:09 PM  Result Value Ref Range   Prothrombin Time 26.0 (H) 11.4 - 15.2 seconds   INR 2.4 (H) 0.8 - 1.2    Comment: (NOTE) INR goal varies based on device and disease states. Performed at [facility], [place] [address], [place], [place] [address]   CBC     Status: Abnormal   Collection Time: 08/24/20  6:09 PM  Result Value Ref Range   WBC 10.2 4.0 - 10.5 K/uL   RBC 2.07 (L) 4.22 - 5.81 MIL/uL   Hemoglobin 7.5 (L) 13.0 - 17.0 g/dL   HCT 21.9 (L) 39.0 - 52.0 %   MCV 105.8 (H) 80.0 - 100.0 fL   MCH 36.2 (H) 26.0 - 34.0 pg   MCHC 34.2 30.0 - 36.0 g/dL   RDW Not Measured 11.5 - 15.5 %   Platelets 74 (L) 150 - 400 K/uL   nRBC 0.0 0.0 - 0.2 %    Comment: Performed at [facility], [place] [address] [place], [place] [address]  Procalcitonin - Baseline     Status: None   Collection Time: 08/24/20  6:09 PM  Result Value Ref Range   Procalcitonin <0.10 ng/mL    Comment:        Interpretation: PCT (Procalcitonin) <= 0.5 ng/mL: Systemic infection (sepsis) is not likely. Local bacterial infection is possible. ICTERIC SPECIMEN (NOTE)       Sepsis PCT Algorithm           Lower Respiratory Tract                                      Infection PCT Algorithm    ----------------------------     ----------------------------         PCT < 0.25 ng/mL                PCT < 0.10 ng/mL          Strongly encourage             Strongly discourage   discontinuation of antibiotics    initiation of antibiotics    ----------------------------     -----------------------------       PCT 0.25 - 0.50 ng/mL  PCT 0.10 - 0.25 ng/mL               OR       >80% decrease in PCT            Discourage initiation of                                            antibiotics      Encourage discontinuation           of antibiotics    ----------------------------     -----------------------------         PCT >= 0.50 ng/mL              PCT  0.26 - 0.50 ng/mL                AND       <80% decrease in PCT             Encourage initiation of                                             antibiotics       Encourage continuation           of antibiotics    ----------------------------     -----------------------------        PCT >= 0.50 ng/mL                  PCT > 0.50 ng/mL               AND         increase in PCT                  Strongly encourage                                      initiation of antibiotics    Strongly encourage escalation           of antibiotics                                     -----------------------------                                           PCT <= 0.25 ng/mL                                                 OR                                        > 80% decrease in PCT  Discontinue / Do not initiate                                             antibiotics  Performed at [place] [address], [place], [place] [address]   Protime-INR     Status: Abnormal   Collection Time: 08/25/20  2:54 AM  Result Value Ref Range   Prothrombin Time 29.4 (H) 11.4 - 15.2 seconds   INR 2.8 (H) 0.8 - 1.2    Comment: (NOTE) INR goal varies based on device and disease states. Performed at [facility], [place] [address], [place], [place] [address]   Comprehensive metabolic panel     Status: Abnormal   Collection Time: 08/25/20  2:54 AM  Result Value Ref Range   Sodium 135 135 - 145 mmol/L   Potassium 4.5 3.5 - 5.1 mmol/L   Chloride 96 (L) 98 - 111 mmol/L   CO2 33 (H) 22 - 32 mmol/L   Glucose, Bld 113 (H) 70 - 99 mg/dL    Comment: Glucose reference range applies only to samples taken after fasting for at least 8 hours.   BUN 9 6 - 20 mg/dL   Creatinine, Ser 0.35 (L) 0.61 - 1.24 mg/dL   Calcium 8.5 (L) 8.9 - 10.3 mg/dL   Total Protein 6.4 (L) 6.5 - 8.1 g/dL   Albumin 2.4 (L) 3.5 - 5.0 g/dL   AST 61 (H) 15 - 41 U/L   ALT 29 0  - 44 U/L   Alkaline Phosphatase 61 38 - 126 U/L   Total Bilirubin 18.6 (HH) 0.3 - 1.2 mg/dL    Comment: CRITICAL VALUE NOTED.  VALUE IS CONSISTENT WITH PREVIOUSLY REPORTED AND CALLED VALUE.   GFR, Estimated >60 >60 mL/min    Comment: (NOTE) Calculated using the CKD-EPI Creatinine Equation (2021)    Anion gap 6 5 - 15    Comment: Performed at [facility], [place] [address] [place], [place] [address]  Magnesium     Status: Abnormal   Collection Time: 08/25/20  2:54 AM  Result Value Ref Range   Magnesium 1.4 (L) 1.7 - 2.4 mg/dL    Comment: Performed at [facility], [place] [address] [place], [place] [address]  Phosphorus     Status: None   Collection Time: 08/25/20  2:54 AM  Result Value Ref Range   Phosphorus 2.6 2.5 - 4.6 mg/dL    Comment: Performed at [facility], [place] [address], [place], [place] [address]  APTT     Status: Abnormal   Collection Time: 08/25/20  2:54 AM  Result Value Ref Range   aPTT 43 (H) 24 - 36 seconds    Comment:        IF BASELINE aPTT IS ELEVATED, SUGGEST PATIENT RISK ASSESSMENT BE USED TO DETERMINE APPROPRIATE ANTICOAGULANT THERAPY. Performed at [facility], [place] [address] [place], [place] [address]   Procalcitonin     Status: None   Collection Time: 08/25/20  2:54 AM  Result Value Ref Range   Procalcitonin 0.10 ng/mL    Comment:        Interpretation: PCT (Procalcitonin) <= 0.5 ng/mL: Systemic infection (sepsis) is not likely. Local bacterial infection is possible. (NOTE)       Sepsis PCT Algorithm  Lower Respiratory Tract                                      Infection PCT Algorithm    ----------------------------     ----------------------------         PCT < 0.25 ng/mL                PCT < 0.10 ng/mL          Strongly encourage             Strongly discourage   discontinuation of antibiotics    initiation of antibiotics    ----------------------------      -----------------------------       PCT 0.25 - 0.50 ng/mL            PCT 0.10 - 0.25 ng/mL               OR       >80% decrease in PCT            Discourage initiation of                                            antibiotics      Encourage discontinuation           of antibiotics    ----------------------------     -----------------------------         PCT >= 0.50 ng/mL              PCT 0.26 - 0.50 ng/mL               AND        <80% decrease in PCT             Encourage initiation of                                             antibiotics       Encourage continuation           of antibiotics    ----------------------------     -----------------------------        PCT >= 0.50 ng/mL                  PCT > 0.50 ng/mL               AND         increase in PCT                  Strongly encourage                                      initiation of antibiotics    Strongly encourage escalation           of antibiotics                                     -----------------------------  PCT <= 0.25 ng/mL                                                 OR                                        > 80% decrease in PCT                                      Discontinue / Do not initiate                                             antibiotics  Performed at [place] [address] [place], [place] [address]   CBC     Status: Abnormal   Collection Time: 08/25/20  4:58 AM  Result Value Ref Range   WBC 10.5 4.0 - 10.5 K/uL   RBC 2.03 (L) 4.22 - 5.81 MIL/uL   Hemoglobin 7.9 (L) 13.0 - 17.0 g/dL   HCT 22.2 (L) 39.0 - 52.0 %   MCV 109.4 (H) 80.0 - 100.0 fL   MCH 38.9 (H) 26.0 - 34.0 pg   MCHC 35.6 30.0 - 36.0 g/dL   RDW Not Measured 11.5 - 15.5 %   Platelets 71 (L) 150 - 400 K/uL    Comment: Immature Platelet Fraction may be clinically indicated, consider ordering this additional test [id number]    nRBC 0.0 0.0 - 0.2 %     Comment: Performed at [facility], [place] [address], [place], [place] [address]    Studies/Results: DG Chest Port 1 View  Result Date: 08/24/2020 CLINICAL DATA:  Hemoptysis EXAM: PORTABLE CHEST 1 VIEW COMPARISON:  08/22/2020 FINDINGS: Interval development of diffuse bilateral airspace disease. Progression of left lower lobe consolidation. No significant effusion. IMPRESSION: Interval development of diffuse bilateral airspace disease which could be pneumonia, edema, or blood given history of hemoptysis. Progressive left lower lobe atelectasis. Electronically Signed   By: Franchot Gallo M.D.   On: 08/24/2020 16:24    Medications: I have reviewed the patient's current medications.  Assessment: Decompensated alcoholic cirrhosis, ongoing alcohol use at home, admitted with severe anemia T bili/AST/ALT/ALP of 18.6/61/29/61   Although patient has been complaining of dark stools, stools are not frankly melanotic or bloody  Patient noted to have bleeding from oral cavity, site of tooth extraction  Despite receiving vitamin K for several days and FFP yesterday PT/INR remains elevated, unable to proceed with an endoscopy for further evaluation  Aspiration pneumonia, on IV Unasyn, suspected to be aspirating blood from oral cavity   Plan: EGD canceled for today, plan for EGD when INR closer to 2 Supportive management: 1. IV Protonix and IV octreotide 2.  Folic acid, multivitamin, thiamine 3.  Lactulose 4. Furosemide 40 mg started from today morning(renal impairment present on admission has resolved with several doses of IV albumin)  Resume clear liquid diet, monitor H&H with plans to transfuse if hemoglobin is less than 7. MELDNa is 30 today(T bili 18.6, INR 2.8, creatinine 0.35) Guarded prognosis.  Ronnette Juniper, MD 08/25/2020, 7:36 AM

## 2020-08-25 NOTE — Progress Notes (Signed)
Pt needing FFP infusion, but all 3 PIVs are in use at this time. IV gtts are incompatible per [employer]. This RN consulted Drew from pharmacy who gave verbal okay to run the remaining Vitamin K infusion at 200 mLs/hr. This RN will be able to administer FFP shortly.

## 2020-08-25 NOTE — Progress Notes (Signed)
PROGRESS NOTE  Eric Dennis:[id number] DOB: 1965-10-13   PCP: Scheryl Marten, PA  Patient is from: Home.  Lives with his wife.  DOA: 08/22/2020 LOS: 3  Chief complaints: Abnormal labs  Brief Narrative / Interim history: 55 year old M with PMH of alcoholic cirrhosis, alcohol abuse and tobacco use disorder directed to ED for low hemoglobin to 4.9.  Had melena, abdominal pain and distention for 2 to 3 weeks.  Also had bleeding from recent dental extraction.  Patient was admitted for decompensated liver failure and acute blood loss anemia with supratherapeutic INR, hyperbilirubinemia to 13.4 and acute hypoxic respiratory failure with concern for possible pneumonia.  Transfused 3 units with appropriate response.  Received vitamin K.  GI consulted.  Started on IV antibiotics, IV PPI and octreotide.  Coagulopathy persisted despite IV vitamin K and FFP.  Subjective: Seen and examined earlier this morning.  No major events overnight of this morning.  No complaints other than dry cough and "some" shortness of breath.  He denies chest pain, nausea, vomiting or abdominal pain.  Had about 4 loose bowel movements in the last 24 hours.  No report of dark stool or fresh red blood.   Oral surgery evacuated some blood clot from extraction site yesterday.  Somewhat dark looking urine.  Objective: Vitals:   08/25/20 0900 08/25/20 0947 08/25/20 1000 08/25/20 1021  BP: (!) 143/65 138/63 134/61 123/71  Pulse: (!) 103 (!) 105 100 100  Resp: (!) 35 (!) 34 (!) 30 (!) 28  Temp:  99.6 F (37.6 C)  98.9 F (37.2 C)  TempSrc:  Oral  Oral  SpO2: 95% 93% 96% 94%  Weight:      Height:        Intake/Output Summary (Last 24 hours) at 08/25/2020 1111 Last data filed at 08/25/2020 1021 Gross per 24 hour  Intake 1793.47 ml  Output 3150 ml  Net -1356.53 ml   Filed Weights   08/22/20 1018  Weight: 81.2 kg    Examination:  GENERAL: No apparent distress.  Nontoxic. HEENT: MMM.  Poor dentition.  Gauze  pack with some blood tinge. NECK: Supple.  No apparent JVD.  RESP: 96% on 10 L.  No IWOB.  Rhonchi bilaterally. CVS:  RRR. Heart sounds normal.  ABD/GI/GU: BS+.  Slightly distended but soft.  Nontender. MSK/EXT:  Moves extremities. No apparent deformity. No edema.  SKIN: Jaundice to his abdomen. NEURO: Awake and alert. Oriented appropriately.  No apparent focal neuro deficit. PSYCH: Calm. Normal affect.   Procedures:  None  Microbiology summarized: [id number] and influenza PCR nonreactive. MRSA PCR screen negative. Blood cultures NGTD.  Assessment & Plan: ABLA likely due to UGIB and slow bleed from recent dental extraction site in the setting of coagulopathy from alcoholic cirrhosis: H&H stable after 3 units. Recent Labs    08/22/20 1031 08/22/20 1356 08/22/20 2231 08/23/20 0926 08/24/20 0254 08/24/20 0849 08/24/20 1809 08/25/20 0458  HGB 4.3*  4.4* 4.4* 5.3* 7.0* 6.8* 7.7* 7.5* 7.9*  -S/p clot evacuation from extraction site by oral surgery on 6/18 -Coagulopathy persisted despite vitamin K and FFP.  Give additional vitamin K and FFP today -Continue PPI and octreotide per GI -SCD for VTE prophylaxis -Plan for EGD by GI once INR close to 2.0.  Decompensated alcoholic liver cirrhosis with ascites-low suspicion for SBP based on exam Hyperbilirubinemia/elevated AST-total bili elevated increased to 18.6 today. Coagulopathy-likely due to alcohol and cirrhosis.  Persistent despite IV vitamin K and FFP Mild hepatic encephalopathy: More alert today.  Ammonia elevated but improving. Recent Labs  Lab 08/22/20 1031 08/23/20 0926 08/24/20 0254 08/25/20 0254  AST 81* 66* 56* 61*  ALT 41 33 31 29  ALKPHOS 94 71 65 61  BILITOT 13.4* 17.0* 18.7* 18.6*  PROT 6.9 6.0* 6.6 6.4*  ALBUMIN 1.8* 1.9* 2.5* 2.4*  -Continue CIWA with as needed Ativan -IV vitamin K and FFP as above. -Follow abdominal US. -Continue lactulose -Continue monitoring -Continue thiamine, folic acid and  multivitamins  ARF with hypoxia in the setting of possible aspiration pneumonitis versus pneumonia: Now requiring 10 L.  CXR with worsening right-sided infiltrate.  Pro-Cal 0.35>> 0.10 -Wean oxygen as able -Continue IV Unasyn -Added IV doxycycline for possible atypical coverage -IV Lasix 40 mg daily -Aspiration precautions   AKI/azotemia: Resolved. Recent Labs    08/22/20 1031 08/23/20 0926 08/24/20 0254 08/25/20 0254  BUN 28* 18 11 9   CREATININE 2.06* 1.02 0.58* 0.35*  -Continue monitoring  Lactic acidosis/hypotension: Hypotension improved.  Lactic acidosis is multifactorial but resolving.   Hyponatremia/hypokalemia/hypochloremia/hypomagnesemia: Likely due to alcohol.  -IV magnesium sulfate 2 g x 1 -Monitor replenish as appropriate   Macrocytosis: Likely due to alcohol.  P10 and folic acid within normal. -Encouraged alcohol cessation   Thrombocytopenia: Likely due to alcohol and cirrhosis. -Continue monitoring   Tobacco abuse:  -Encourage cessation. -We will add nicotine patch  Dental caries with recent dental extraction: oozing from recent dental extraction likely due to coagulopathy and thrombocytopenia. -Correct coagulopathy as above. -Oral surgery evacuated blood clots from extraction site and applied gauze pressure on 6/18 -Continue gauze pressure    Body mass index is 23.62 kg/m.         DVT prophylaxis:  SCDs Start: 08/22/20 1218  Code Status: Full code Family Communication: Updated patient's wife at bedside on 6/18.  None at bedside today. Level of care: Stepdown Status is: Inpatient  Remains inpatient appropriate because:Persistent severe electrolyte disturbances, Ongoing diagnostic testing needed not appropriate for outpatient work up, IV treatments appropriate due to intensity of illness or inability to take PO, and Inpatient level of care appropriate due to severity of illness  Dispo: The patient is from: Home              Anticipated d/c is  to: Home              Patient currently is not medically stable to d/c.   Difficult to place patient No       Consultants:  Gastroenterology Oral surgery   Sch Meds:  Scheduled Meds:  sodium chloride   Intravenous Once   sodium chloride   Intravenous Once   Chlorhexidine Gluconate Cloth  6 each Topical Daily   doxycycline  100 mg Oral C58N   folic acid  1 mg Intravenous Daily   furosemide  40 mg Intravenous Daily   lactulose  20 g Oral BID   LORazepam  0-4 mg Intravenous Q8H   mouth rinse  15 mL Mouth Rinse BID   multivitamin  15 mL Oral Daily   nicotine  21 mg Transdermal Daily   thiamine injection  100 mg Intravenous Daily   Continuous Infusions:  sodium chloride Stopped (08/22/20 1219)   sodium chloride Stopped (08/24/20 0412)   ampicillin-sulbactam (UNASYN) IV Stopped (08/25/20 0541)   octreotide  (SANDOSTATIN)    IV infusion 50 mcg/hr (08/25/20 1011)   pantoprazole 8 mg/hr (08/25/20 1011)   PRN Meds:.sodium chloride, acetaminophen **OR** acetaminophen, lip balm, LORazepam **OR** LORazepam, ondansetron **OR** ondansetron (ZOFRAN) IV  Antimicrobials: Anti-infectives (From admission, onward)    Start     Dose/Rate Route Frequency Ordered Stop   08/24/20 2200  doxycycline (VIBRA-TABS) tablet 100 mg        100 mg Oral Every 12 hours 08/24/20 1654     08/22/20 1800  Ampicillin-Sulbactam (UNASYN) 3 g in sodium chloride 0.9 % 100 mL IVPB        3 g 200 mL/hr over 30 Minutes Intravenous Every 6 hours 08/22/20 1302     08/22/20 1200  Ampicillin-Sulbactam (UNASYN) 3 g in sodium chloride 0.9 % 100 mL IVPB        3 g 200 mL/hr over 30 Minutes Intravenous  Once 08/22/20 1158 08/22/20 1351        I have personally reviewed the following labs and images: CBC: Recent Labs  Lab 08/22/20 1031 08/22/20 1356 08/22/20 2231 08/23/20 0926 08/24/20 0254 08/24/20 0849 08/24/20 1809 08/25/20 0458  WBC 11.7*  12.5*   < > 9.0 9.1 8.7  --  10.2 10.5  NEUTROABS 8.5*  --    --   --   --   --   --   --   HGB 4.3*  4.4*   < > 5.3* 7.0* 6.8* 7.7* 7.5* 7.9*  HCT 13.0*  11.9*   < > 14.7* 19.4* 19.7* 22.3* 21.9* 22.2*  MCV 146.1*  143.4*   < > 124.6* 109.0* 111.9*  --  105.8* 109.4*  PLT 88*  98*   < > 71* 82* 76*  --  74* 71*   < > = values in this interval not displayed.   BMP &GFR Recent Labs  Lab 08/22/20 1031 08/22/20 1356 08/23/20 0926 08/24/20 0254 08/25/20 0254  NA 131*  --  135 134* 135  K 3.3*  --  3.8 3.9 4.5  CL 89*  --  97* 98 96*  CO2 28  --  31 30 33*  GLUCOSE 110*  --  95 125* 113*  BUN 28*  --  18 11 9   CREATININE 2.06*  --  1.02 0.58* 0.35*  CALCIUM 8.7*  --  8.3* 8.5* 8.5*  MG 1.7 1.5* 1.9 1.7 1.4*  PHOS 5.8* 5.7* 4.1 2.7 2.6   Estimated Creatinine Clearance: 116.5 mL/min (A) (by C-G formula based on SCr of 0.35 mg/dL (L)). Liver & Pancreas: Recent Labs  Lab 08/22/20 1031 08/23/20 0926 08/24/20 0254 08/25/20 0254  AST 81* 66* 56* 61*  ALT 41 33 31 29  ALKPHOS 94 71 65 61  BILITOT 13.4* 17.0* 18.7* 18.6*  PROT 6.9 6.0* 6.6 6.4*  ALBUMIN 1.8* 1.9* 2.5* 2.4*   No results for input(s): LIPASE, AMYLASE in the last 168 hours. Recent Labs  Lab 08/23/20 1222 08/24/20 0254  AMMONIA 65* 58*   Diabetic: No results for input(s): HGBA1C in the last 72 hours. No results for input(s): GLUCAP in the last 168 hours. Cardiac Enzymes: No results for input(s): CKTOTAL, CKMB, CKMBINDEX, TROPONINI in the last 168 hours. No results for input(s): PROBNP in the last 8760 hours. Coagulation Profile: Recent Labs  Lab 08/22/20 1031 08/23/20 0926 08/24/20 0254 08/24/20 1809 08/25/20 0254  INR 3.1* 2.6* 2.7* 2.4* 2.8*   Thyroid Function Tests: No results for input(s): TSH, T4TOTAL, FREET4, T3FREE, THYROIDAB in the last 72 hours. Lipid Profile: No results for input(s): CHOL, HDL, LDLCALC, TRIG, CHOLHDL, LDLDIRECT in the last 72 hours. Anemia Panel: Recent Labs    08/22/20 1356  VITAMINB12 2,262*  FOLATE 11.3  FERRITIN 3,372*  TIBC 119*  IRON 119   Urine analysis:    Component Value Date/Time   COLORURINE AMBER (A) 10/07/2018 1615   APPEARANCEUR HAZY (A) 10/07/2018 1615   LABSPEC 1.025 10/07/2018 1615   PHURINE 6.0 10/07/2018 1615   GLUCOSEU NEGATIVE 10/07/2018 1615   HGBUR NEGATIVE 10/07/2018 1615   BILIRUBINUR MODERATE (A) 10/07/2018 1615   KETONESUR NEGATIVE 10/07/2018 1615   PROTEINUR NEGATIVE 10/07/2018 1615   NITRITE NEGATIVE 10/07/2018 1615   LEUKOCYTESUR NEGATIVE 10/07/2018 1615   Sepsis Labs: Invalid input(s): PROCALCITONIN, [place]  Microbiology: Recent Results (from the past 240 hour(s))  Resp Panel by RT-PCR (Flu A&B, Covid) Nasopharyngeal Swab     Status: None   Collection Time: 08/22/20 10:55 AM   Specimen: Nasopharyngeal Swab; Nasopharyngeal(NP) swabs in vial transport medium  Result Value Ref Range Status   SARS Coronavirus 2 by RT PCR NEGATIVE NEGATIVE Final    Comment: (NOTE) SARS-CoV-2 target nucleic acids are NOT DETECTED.  The SARS-CoV-2 RNA is generally detectable in upper respiratory specimens during the acute phase of infection. The lowest concentration of SARS-CoV-2 viral copies this assay can detect is 138 copies/mL. A negative result does not preclude SARS-Cov-2 infection and should not be used as the sole basis for treatment or other patient management decisions. A negative result may occur with  improper specimen collection/handling, submission of specimen other than nasopharyngeal swab, presence of viral mutation(s) within the areas targeted by this assay, and inadequate number of viral copies(<138 copies/mL). A negative result must be combined with clinical observations, patient history, and epidemiological information. The expected result is Negative.  Fact Sheet for Patients:  EntrepreneurPulse.com.au  Fact Sheet for Healthcare Providers:  IncredibleEmployment.be  This test is no t yet approved or cleared by the  [place] FDA and  has been authorized for detection and/or diagnosis of SARS-CoV-2 by FDA under an Emergency Use Authorization (EUA). This EUA will remain  in effect (meaning this test can be used) for the duration of the COVID-19 declaration under Section 564(b)(1) of the Act, 21 U.S.C.section 360bbb-3(b)(1), unless the authorization is terminated  or revoked sooner.       Influenza A by PCR NEGATIVE NEGATIVE Final   Influenza B by PCR NEGATIVE NEGATIVE Final    Comment: (NOTE) The Xpert Xpress SARS-CoV-2/FLU/RSV plus assay is intended as an aid in the diagnosis of influenza from Nasopharyngeal swab specimens and should not be used as a sole basis for treatment. Nasal washings and aspirates are unacceptable for Xpert Xpress SARS-CoV-2/FLU/RSV testing.  Fact Sheet for Patients: EntrepreneurPulse.com.au  Fact Sheet for Healthcare Providers: IncredibleEmployment.be  This test is not yet approved or cleared by the [place] FDA and has been authorized for detection and/or diagnosis of SARS-CoV-2 by FDA under an Emergency Use Authorization (EUA). This EUA will remain in effect (meaning this test can be used) for the duration of the COVID-19 declaration under Section 564(b)(1) of the Act, 21 U.S.C. section 360bbb-3(b)(1), unless the authorization is terminated or revoked.  Performed at [facility], [place] [address] [place], [place] [address]   Culture, blood (routine x 2)     Status: None (Preliminary result)   Collection Time: 08/22/20 12:04 PM   Specimen: BLOOD LEFT FOREARM  Result Value Ref Range Status   Specimen Description   Final    BLOOD LEFT FOREARM Performed at [facility], [place] [address] [place], [place] [address]    Special Requests   Final    BOTTLES DRAWN AEROBIC AND  ANAEROBIC Blood Culture adequate volume Performed at [place] [address] [place], [place]  [address]    Culture   Final    NO GROWTH 3 DAYS Performed at [facility], [place] [address], [place], [place] [address]    Report Status PENDING  Incomplete  Culture, blood (routine x 2)     Status: None (Preliminary result)   Collection Time: 08/22/20 12:05 PM   Specimen: BLOOD RIGHT HAND  Result Value Ref Range Status   Specimen Description   Final    BLOOD RIGHT HAND Performed at [facility] [address] [place], [place] [address]    Special Requests   Final    BOTTLES DRAWN AEROBIC AND ANAEROBIC Blood Culture adequate volume Performed at [place] [address] [place], [place] [address]    Culture   Final    NO GROWTH 3 DAYS Performed at [facility], [place] [address], [place], [place] [address]    Report Status PENDING  Incomplete  MRSA PCR Screening     Status: None   Collection Time: 08/22/20  1:40 PM  Result Value Ref Range Status   MRSA by PCR NEGATIVE NEGATIVE Final    Comment:        The GeneXpert MRSA Assay (FDA approved for NASAL specimens only), is one component of a comprehensive MRSA colonization surveillance program. It is not intended to diagnose MRSA infection nor to guide or monitor treatment for MRSA infections. Performed at [facility] [place] [facility], [place] [address], [place], [place] [address]     Radiology Studies: [facility] Chest Port 1 View  Result Date: 08/24/2020 CLINICAL DATA:  Hemoptysis EXAM: PORTABLE CHEST 1 VIEW COMPARISON:  08/22/2020 FINDINGS: Interval development of diffuse bilateral airspace disease. Progression of left lower lobe consolidation. No significant effusion. IMPRESSION: Interval development of diffuse bilateral airspace disease which could be pneumonia, edema, or blood given history of hemoptysis. Progressive left lower lobe atelectasis. Electronically Signed   By: Franchot Gallo M.D.   On: 08/24/2020 16:24      Tory Septer T. Waldo  If 7PM-7AM, please  contact night-coverage [email or web address] 08/25/2020, 11:11 AM

## 2020-08-26 ENCOUNTER — Inpatient Hospital Stay (HOSPITAL_COMMUNITY): Payer: BC Managed Care – PPO

## 2020-08-26 ENCOUNTER — Encounter (HOSPITAL_COMMUNITY): Payer: Self-pay | Admitting: Internal Medicine

## 2020-08-26 DIAGNOSIS — R0609 Other forms of dyspnea: Secondary | ICD-10-CM

## 2020-08-26 DIAGNOSIS — D62 Acute posthemorrhagic anemia: Secondary | ICD-10-CM

## 2020-08-26 DIAGNOSIS — K922 Gastrointestinal hemorrhage, unspecified: Secondary | ICD-10-CM | POA: Diagnosis not present

## 2020-08-26 DIAGNOSIS — K7031 Alcoholic cirrhosis of liver with ascites: Principal | ICD-10-CM

## 2020-08-26 DIAGNOSIS — J9601 Acute respiratory failure with hypoxia: Secondary | ICD-10-CM | POA: Diagnosis not present

## 2020-08-26 LAB — ECHOCARDIOGRAM LIMITED
Area-P 1/2: 4.35 cm2
Height: 73 in
S' Lateral: 2.7 cm
Weight: 2952.4 oz

## 2020-08-26 LAB — RENAL FUNCTION PANEL
Albumin: 2.3 g/dL — ABNORMAL LOW (ref 3.5–5.0)
Anion gap: 8 (ref 5–15)
BUN: 10 mg/dL (ref 6–20)
CO2: 30 mmol/L (ref 22–32)
Calcium: 8.4 mg/dL — ABNORMAL LOW (ref 8.9–10.3)
Chloride: 93 mmol/L — ABNORMAL LOW (ref 98–111)
Creatinine, Ser: 0.49 mg/dL — ABNORMAL LOW (ref 0.61–1.24)
GFR, Estimated: >60 mL/min (ref >60)
Glucose, Bld: 112 mg/dL — ABNORMAL HIGH (ref 70–99)
Phosphorus: 2.6 mg/dL (ref 2.5–4.6)
Potassium: 4.2 mmol/L (ref 3.5–5.1)
Sodium: 131 mmol/L — ABNORMAL LOW (ref 135–145)

## 2020-08-26 LAB — BPAM FFP
Blood Product Expiration Date: 202206232359
Blood Product Expiration Date: 202206232359
ISSUE DATE / TIME: 202206181407
ISSUE DATE / TIME: 202206190947
Unit Type and Rh: 6200
Unit Type and Rh: 6200

## 2020-08-26 LAB — CBC
HCT: 23 % — ABNORMAL LOW (ref 39.0–52.0)
Hemoglobin: 7.7 g/dL — ABNORMAL LOW (ref 13.0–17.0)
MCH: 36 pg — ABNORMAL HIGH (ref 26.0–34.0)
MCHC: 33.5 g/dL (ref 30.0–36.0)
MCV: 107.5 fL — ABNORMAL HIGH (ref 80.0–100.0)
Platelets: 52 10*3/uL — ABNORMAL LOW (ref 150–400)
RBC: 2.14 MIL/uL — ABNORMAL LOW (ref 4.22–5.81)
WBC: 9.3 10*3/uL (ref 4.0–10.5)
nRBC: 0 % (ref 0.0–0.2)

## 2020-08-26 LAB — MAGNESIUM: Magnesium: 1.6 mg/dL — ABNORMAL LOW (ref 1.7–2.4)

## 2020-08-26 LAB — BLOOD GAS, VENOUS
Acid-Base Excess: 12.5 mmol/L — ABNORMAL HIGH (ref 0.0–2.0)
Bicarbonate: 38.8 mmol/L — ABNORMAL HIGH (ref 20.0–28.0)
O2 Saturation: 65.7 %
Patient temperature: 98.6
pCO2, Ven: 69 mmHg — ABNORMAL HIGH (ref 44.0–60.0)
pH, Ven: 7.369 (ref 7.250–7.430)
pO2, Ven: 38.7 mmHg (ref 32.0–45.0)

## 2020-08-26 LAB — PREPARE FRESH FROZEN PLASMA
Unit division: 0
Unit division: 0

## 2020-08-26 LAB — HEPATIC FUNCTION PANEL
ALT: 29 U/L (ref 0–44)
AST: 50 U/L — ABNORMAL HIGH (ref 15–41)
Albumin: 2.4 g/dL — ABNORMAL LOW (ref 3.5–5.0)
Alkaline Phosphatase: 61 U/L (ref 38–126)
Bilirubin, Direct: 5.5 mg/dL — ABNORMAL HIGH (ref 0.0–0.2)
Indirect Bilirubin: 12.5 mg/dL — ABNORMAL HIGH (ref 0.3–0.9)
Total Bilirubin: 18 mg/dL — ABNORMAL HIGH (ref 0.3–1.2)
Total Protein: 6.6 g/dL (ref 6.5–8.1)

## 2020-08-26 LAB — PROCALCITONIN: Procalcitonin: 0.25 ng/mL

## 2020-08-26 LAB — PHOSPHORUS: Phosphorus: 2.6 mg/dL (ref 2.5–4.6)

## 2020-08-26 LAB — PROTIME-INR
INR: 2.6 — ABNORMAL HIGH (ref 0.8–1.2)
Prothrombin Time: 27.9 seconds — ABNORMAL HIGH (ref 11.4–15.2)

## 2020-08-26 LAB — AMMONIA: Ammonia: 45 umol/L — ABNORMAL HIGH (ref 9–35)

## 2020-08-26 MED ORDER — THIAMINE HCL 100 MG/ML IJ SOLN: 100.0000 mg | Freq: Every day | INTRAMUSCULAR | Status: DC

## 2020-08-26 MED ORDER — FUROSEMIDE 10 MG/ML IJ SOLN
40.0000 mg | Freq: Two times a day (BID) | INTRAMUSCULAR | Status: DC
  Administered 2020-08-26 – 2020-08-30 (×8): 40 mg via INTRAVENOUS
  Filled 2020-08-26 (×8): qty 4

## 2020-08-26 MED ORDER — CARVEDILOL 6.25 MG PO TABS
6.2500 mg | ORAL_TABLET | Freq: Two times a day (BID) | ORAL | Status: DC
  Administered 2020-08-26 – 2020-08-31 (×9): 6.25 mg via ORAL
  Filled 2020-08-26 (×10): qty 1

## 2020-08-26 MED ORDER — THIAMINE HCL 100 MG/ML IJ SOLN
250.0000 mg | Freq: Every day | INTRAVENOUS | Status: DC
  Filled 2020-08-26 (×2): qty 2.5

## 2020-08-26 MED ORDER — MAGNESIUM SULFATE 2 GM/50ML IV SOLN
2.0000 g | Freq: Once | INTRAVENOUS | Status: AC
  Administered 2020-08-26: 2 g via INTRAVENOUS
  Filled 2020-08-26: qty 50

## 2020-08-26 MED ORDER — THIAMINE HCL 100 MG/ML IJ SOLN
500.0000 mg | Freq: Every day | INTRAVENOUS | Status: AC
  Administered 2020-08-26 – 2020-08-30 (×5): 500 mg via INTRAVENOUS
  Filled 2020-08-26 (×6): qty 5

## 2020-08-26 MED ORDER — IPRATROPIUM-ALBUTEROL 0.5-2.5 (3) MG/3ML IN SOLN
3.0000 mL | Freq: Four times a day (QID) | RESPIRATORY_TRACT | Status: DC
  Filled 2020-08-26: qty 3

## 2020-08-26 NOTE — Progress Notes (Signed)
PROGRESS NOTE  Eric Dennis FIE:[id number] DOB: 08/13/1965   PCP: Scheryl Marten, PA  Patient is from: Home.  Lives with his wife.  DOA: 08/22/2020 LOS: 4  Chief complaints: Abnormal labs  Brief Narrative / Interim history: 55 year old M with PMH of alcoholic cirrhosis, alcohol abuse and tobacco use disorder directed to ED for low hemoglobin to 4.9.  Had melena, abdominal pain and distention for 2 to 3 weeks.  Also had bleeding from recent dental extraction.  Patient was admitted for decompensated liver failure and acute blood loss anemia with supratherapeutic INR, hyperbilirubinemia to 13.4 and acute hypoxic respiratory failure with concern for possible pneumonia.  Transfused 3 units with appropriate response.  Received vitamin K.  Started on IV antibiotics, IV PPI and octreotide.  Coagulopathy persisted despite IV vitamin K and FFP.  GI following.  Subjective: Seen and examined earlier this morning.  Had an episode of desaturation to 60s after taking of his nasal cannula.  He was put on nonrebreather and oxygen recovered quickly.  He is currently saturating in upper 90s on 1 L by nasal cannula.  No complaints but tired from not getting good sleep last night.  He says "I do not feel sick.  I am just tired".  He denies shortness of breath, chest pain or GI symptoms.   Objective: Vitals:   08/26/20 0700 08/26/20 0800 08/26/20 0900 08/26/20 1000  BP: 138/71 (!) 141/66 122/68 140/70  Pulse:   (!) 104 (!) 107  Resp: (!) 23 (!) 51 (!) 26 (!) 43  Temp:  100.2 F (37.9 C)    TempSrc:  Axillary    SpO2:  100% 94% 94%  Weight:      Height:        Intake/Output Summary (Last 24 hours) at 08/26/2020 1033 Last data filed at 08/26/2020 1000 Gross per 24 hour  Intake 1773.54 ml  Output 2900 ml  Net -1126.46 ml   Filed Weights   08/22/20 1018 08/26/20 0500  Weight: 81.2 kg 83.7 kg    Examination:  GENERAL: No apparent distress.  Nontoxic. HEENT: MMM.  Poor dentition.  Gauze pack  with some blood tinge. NECK: Supple.  No apparent JVD.  RESP: 96% on 10 L.  No IWOB.  Rhonchi bilaterally. CVS:  RRR. Heart sounds normal.  ABD/GI/GU: BS+.  Slightly distended but soft.  Nontender. MSK/EXT:  Moves extremities. No apparent deformity. No edema.  SKIN: Jaundice to his abdomen. NEURO: Awake and alert. Oriented appropriately.  No apparent focal neuro deficit. PSYCH: Calm. Normal affect.   GENERAL: No apparent distress.  Looks tired. HEENT: MMM.  Sclerae icteric.  Poor dentition. NECK: Supple.  No apparent JVD.  RESP: 99% on 9 L.  No IWOB.  Rhonchi.  Poor inspiratory effort. CVS:  RRR. Heart sounds normal.  ABD/GI/GU: BS+. Abd soft, NTND.  MSK/EXT:  Moves extremities. No apparent deformity. No edema.  SKIN: Jaundice to his abdomen. NEURO: Awake but not quite alert.  Oriented x4.  No apparent focal neuro deficit. PSYCH: Calm. Normal affect.   Procedures:  None  Microbiology summarized: [id number] and influenza PCR nonreactive. MRSA PCR screen negative. Blood cultures NGTD.  Assessment & Plan: ABLA likely due to UGIB and slow bleed from recent dental extraction site in the setting of coagulopathy from alcoholic cirrhosis: H&H stable after 3 units. Recent Labs    08/22/20 1031 08/22/20 1356 08/22/20 2231 08/23/20 0926 08/24/20 0254 08/24/20 0849 08/24/20 1809 08/25/20 0458 08/25/20 1439 08/26/20 0315  HGB 4.3*  4.4* 4.4* 5.3* 7.0* 6.8* 7.7* 7.5* 7.9* 7.3* 7.7*  -S/p clot evacuation and suture of extraction site by oral surgery on 6/18 -Coagulopathy persisted despite rounds of vitamin K and FFP.  -Continue PPI and octreotide per GI -SCD for VTE prophylaxis -Plan for EGD by GI once INR close to 2.0.  Decompensated alcoholic liver cirrhosis with ascites-low suspicion for SBP based on exam Hyperbilirubinemia/elevated AST-total bili elevated increased to 18 today. Coagulopathy-likely due to alcohol and cirrhosis.  Persistent despite IV vitamin K and FFP Hepatic  encephalopathy: Ammonia elevated but improving. Recent Labs  Lab 08/22/20 1031 08/23/20 0926 08/24/20 0254 08/25/20 0254 08/26/20 0315 08/26/20 0828  AST 81* 66* 56* 61* 50*  --   ALT 41 33 31 29 29   --   ALKPHOS 94 71 65 61 61  --   BILITOT 13.4* 17.0* 18.7* 18.6* 18.0*  --   PROT 6.9 6.0* 6.6 6.4* 6.6  --   ALBUMIN 1.8* 1.9* 2.5* 2.4* 2.4* 2.3*  -MELD-Na score of 31 with 27 to 32% 90-day mortality -Maddrey's Discriminant function > 80 suggesting very poor prognosis.  Could be benefit from steroid? -Continue CIWA with as needed Ativan-no withdrawal symptoms yet. -Follow abdominal US. -Continue lactulose -Continue monitoring -Continue thiamine, folic acid and multivitamins -GI following.  ARF with hypoxia in the setting of possible aspiration pneumonitis versus pneumonia: Now requiring 10 L.  CXR with worsening right-sided infiltrate.  Pro-Cal 0.35>> 0.10> 0.25 -Wean oxygen as able -Continue IV Unasyn and doxycycline -Continue IV Lasix 40 mg daily -Aspiration precautions   AKI/azotemia: Resolved. Recent Labs    08/22/20 1031 08/23/20 0926 08/24/20 0254 08/25/20 0254 08/26/20 0828  BUN 28* 18 11 9 10   CREATININE 2.06* 1.02 0.58* 0.35* 0.49*  -Continue monitoring  Lactic acidosis/hypotension: Hypotension improved.  Lactic acidosis is multifactorial but resolving.  Hyponatremia: Likely due to cirrhosis -Continue monitoring.   Hypokalemia/hypomagnesemia: Hypokalemia resolved.  Mg 1.6. -IV magnesium sulfate 2 g x 1 -Monitor replenish as appropriate   Macrocytosis: Likely due to alcohol.  W65 and folic acid within normal. -Encouraged alcohol cessation   Thrombocytopenia: Likely due to alcohol and cirrhosis. Recent Labs  Lab 08/22/20 1031 08/22/20 1356 08/22/20 2231 08/23/20 0926 08/24/20 0254 08/24/20 1809 08/25/20 0458 08/25/20 1439 08/26/20 0315  PLT 88*  98* 72* 71* 82* 76* 74* 71* 68* 52*  -Continue monitoring   Tobacco abuse:  -Encourage  cessation. -We will add nicotine patch  Dental caries with recent dental extraction: oozing from recent dental extraction likely due to coagulopathy and thrombocytopenia. -Correct coagulopathy as above. -Appreciate help by oral surgery.    Body mass index is 24.35 kg/m.         DVT prophylaxis:  SCDs Start: 08/22/20 1218  Code Status: Full code Family Communication: Updated patient's wife at bedside on 6/18.  None at bedside today. Level of care: Stepdown Status is: Inpatient  Remains inpatient appropriate because:Persistent severe electrolyte disturbances, Ongoing diagnostic testing needed not appropriate for outpatient work up, IV treatments appropriate due to intensity of illness or inability to take PO, and Inpatient level of care appropriate due to severity of illness  Dispo: The patient is from: Home              Anticipated d/c is to: Home              Patient currently is not medically stable to d/c.   Difficult to place patient No       Consultants:  Gastroenterology Oral surgery  Sch Meds:  Scheduled Meds:  sodium chloride   Intravenous Once   sodium chloride   Intravenous Once   Chlorhexidine Gluconate Cloth  6 each Topical Daily   doxycycline  100 mg Oral L79G   folic acid  1 mg Intravenous Daily   furosemide  40 mg Intravenous Daily   lactulose  20 g Oral BID   LORazepam  0-4 mg Intravenous Q8H   mouth rinse  15 mL Mouth Rinse BID   multivitamin  15 mL Oral Daily   nicotine  21 mg Transdermal Daily   thiamine injection  100 mg Intravenous Daily   Continuous Infusions:  sodium chloride Stopped (08/22/20 1219)   sodium chloride 10 mL/hr at 08/26/20 1000   ampicillin-sulbactam (UNASYN) IV Stopped (08/26/20 0615)   octreotide  (SANDOSTATIN)    IV infusion 50 mcg/hr (08/26/20 0810)   pantoprazole 8 mg/hr (08/26/20 1000)   PRN Meds:.sodium chloride, acetaminophen **OR** acetaminophen, ipratropium-albuterol, lip balm, ondansetron **OR** ondansetron  (ZOFRAN) IV  Antimicrobials: Anti-infectives (From admission, onward)    Start     Dose/Rate Route Frequency Ordered Stop   08/24/20 2200  doxycycline (VIBRA-TABS) tablet 100 mg        100 mg Oral Every 12 hours 08/24/20 1654     08/22/20 1800  Ampicillin-Sulbactam (UNASYN) 3 g in sodium chloride 0.9 % 100 mL IVPB        3 g 200 mL/hr over 30 Minutes Intravenous Every 6 hours 08/22/20 1302     08/22/20 1200  Ampicillin-Sulbactam (UNASYN) 3 g in sodium chloride 0.9 % 100 mL IVPB        3 g 200 mL/hr over 30 Minutes Intravenous  Once 08/22/20 1158 08/22/20 1351        I have personally reviewed the following labs and images: CBC: Recent Labs  Lab 08/22/20 1031 08/22/20 1356 08/24/20 0254 08/24/20 0849 08/24/20 1809 08/25/20 0458 08/25/20 1439 08/26/20 0315  WBC 11.7*  12.5*   < > 8.7  --  10.2 10.5 10.6* 9.3  NEUTROABS 8.5*  --   --   --   --   --   --   --   HGB 4.3*  4.4*   < > 6.8* 7.7* 7.5* 7.9* 7.3* 7.7*  HCT 13.0*  11.9*   < > 19.7* 22.3* 21.9* 22.2* 19.6* 23.0*  MCV 146.1*  143.4*   < > 111.9*  --  105.8* 109.4* 112.6* 107.5*  PLT 88*  98*   < > 76*  --  74* 71* 68* 52*   < > = values in this interval not displayed.   BMP &GFR Recent Labs  Lab 08/22/20 1031 08/22/20 1356 08/23/20 0926 08/24/20 0254 08/25/20 0254 08/26/20 0315 08/26/20 0828  NA 131*  --  135 134* 135  --  131*  K 3.3*  --  3.8 3.9 4.5  --  4.2  CL 89*  --  97* 98 96*  --  93*  CO2 28  --  31 30 33*  --  30  GLUCOSE 110*  --  95 125* 113*  --  112*  BUN 28*  --  18 11 9   --  10  CREATININE 2.06*  --  1.02 0.58* 0.35*  --  0.49*  CALCIUM 8.7*  --  8.3* 8.5* 8.5*  --  8.4*  MG 1.7 1.5* 1.9 1.7 1.4* 1.6*  --   PHOS 5.8* 5.7* 4.1 2.7 2.6 2.6 2.6   Estimated Creatinine Clearance: 116.5 mL/min (  A) (by C-G formula based on SCr of 0.49 mg/dL (L)). Liver & Pancreas: Recent Labs  Lab 08/22/20 1031 08/23/20 0926 08/24/20 0254 08/25/20 0254 08/26/20 0315 08/26/20 0828  AST 81* 66* 56*  61* 50*  --   ALT 41 33 31 29 29   --   ALKPHOS 94 71 65 61 61  --   BILITOT 13.4* 17.0* 18.7* 18.6* 18.0*  --   PROT 6.9 6.0* 6.6 6.4* 6.6  --   ALBUMIN 1.8* 1.9* 2.5* 2.4* 2.4* 2.3*   No results for input(s): LIPASE, AMYLASE in the last 168 hours. Recent Labs  Lab 08/23/20 1222 08/24/20 0254 08/26/20 0315  AMMONIA 65* 58* 45*   Diabetic: No results for input(s): HGBA1C in the last 72 hours. No results for input(s): GLUCAP in the last 168 hours. Cardiac Enzymes: No results for input(s): CKTOTAL, CKMB, CKMBINDEX, TROPONINI in the last 168 hours. No results for input(s): PROBNP in the last 8760 hours. Coagulation Profile: Recent Labs  Lab 08/23/20 0926 08/24/20 0254 08/24/20 1809 08/25/20 0254 08/26/20 0315  INR 2.6* 2.7* 2.4* 2.8* 2.6*   Thyroid Function Tests: No results for input(s): TSH, T4TOTAL, FREET4, T3FREE, THYROIDAB in the last 72 hours. Lipid Profile: No results for input(s): CHOL, HDL, LDLCALC, TRIG, CHOLHDL, LDLDIRECT in the last 72 hours. Anemia Panel: No results for input(s): VITAMINB12, FOLATE, FERRITIN, TIBC, IRON, RETICCTPCT in the last 72 hours.  Urine analysis:    Component Value Date/Time   COLORURINE AMBER (A) 10/07/2018 1615   APPEARANCEUR HAZY (A) 10/07/2018 1615   LABSPEC 1.025 10/07/2018 1615   PHURINE 6.0 10/07/2018 1615   GLUCOSEU NEGATIVE 10/07/2018 1615   HGBUR NEGATIVE 10/07/2018 1615   BILIRUBINUR MODERATE (A) 10/07/2018 1615   KETONESUR NEGATIVE 10/07/2018 1615   PROTEINUR NEGATIVE 10/07/2018 1615   NITRITE NEGATIVE 10/07/2018 1615   LEUKOCYTESUR NEGATIVE 10/07/2018 1615   Sepsis Labs: Invalid input(s): PROCALCITONIN, [place]  Microbiology: Recent Results (from the past 240 hour(s))  Resp Panel by RT-PCR (Flu A&B, Covid) Nasopharyngeal Swab     Status: None   Collection Time: 08/22/20 10:55 AM   Specimen: Nasopharyngeal Swab; Nasopharyngeal(NP) swabs in vial transport medium  Result Value Ref Range Status   SARS  Coronavirus 2 by RT PCR NEGATIVE NEGATIVE Final    Comment: (NOTE) SARS-CoV-2 target nucleic acids are NOT DETECTED.  The SARS-CoV-2 RNA is generally detectable in upper respiratory specimens during the acute phase of infection. The lowest concentration of SARS-CoV-2 viral copies this assay can detect is 138 copies/mL. A negative result does not preclude SARS-Cov-2 infection and should not be used as the sole basis for treatment or other patient management decisions. A negative result may occur with  improper specimen collection/handling, submission of specimen other than nasopharyngeal swab, presence of viral mutation(s) within the areas targeted by this assay, and inadequate number of viral copies(<138 copies/mL). A negative result must be combined with clinical observations, patient history, and epidemiological information. The expected result is Negative.  Fact Sheet for Patients:  EntrepreneurPulse.com.au  Fact Sheet for Healthcare Providers:  IncredibleEmployment.be  This test is no t yet approved or cleared by the [place] FDA and  has been authorized for detection and/or diagnosis of SARS-CoV-2 by FDA under an Emergency Use Authorization (EUA). This EUA will remain  in effect (meaning this test can be used) for the duration of the COVID-19 declaration under Section 564(b)(1) of the Act, 21 U.S.C.section 360bbb-3(b)(1), unless the authorization is terminated  or revoked sooner.  Influenza A by PCR NEGATIVE NEGATIVE Final   Influenza B by PCR NEGATIVE NEGATIVE Final    Comment: (NOTE) The Xpert Xpress SARS-CoV-2/FLU/RSV plus assay is intended as an aid in the diagnosis of influenza from Nasopharyngeal swab specimens and should not be used as a sole basis for treatment. Nasal washings and aspirates are unacceptable for Xpert Xpress SARS-CoV-2/FLU/RSV testing.  Fact Sheet for  Patients: EntrepreneurPulse.com.au  Fact Sheet for Healthcare Providers: IncredibleEmployment.be  This test is not yet approved or cleared by the [place] FDA and has been authorized for detection and/or diagnosis of SARS-CoV-2 by FDA under an Emergency Use Authorization (EUA). This EUA will remain in effect (meaning this test can be used) for the duration of the COVID-19 declaration under Section 564(b)(1) of the Act, 21 U.S.C. section 360bbb-3(b)(1), unless the authorization is terminated or revoked.  Performed at [facility], [place] [address], [place], [place] [address]   Culture, blood (routine x 2)     Status: None (Preliminary result)   Collection Time: 08/22/20 12:04 PM   Specimen: BLOOD LEFT FOREARM  Result Value Ref Range Status   Specimen Description   Final    BLOOD LEFT FOREARM Performed at [facility], [place] [address] [place], [place] [address]    Special Requests   Final    BOTTLES DRAWN AEROBIC AND ANAEROBIC Blood Culture adequate volume Performed at [place] [address], [place], [place] [address]    Culture   Final    NO GROWTH 4 DAYS Performed at [facility], [place] [address], [place], [place] [address]    Report Status PENDING  Incomplete  Culture, blood (routine x 2)     Status: None (Preliminary result)   Collection Time: 08/22/20 12:05 PM   Specimen: BLOOD RIGHT HAND  Result Value Ref Range Status   Specimen Description   Final    BLOOD RIGHT HAND Performed at [place] [address] [place], [place] [address]    Special Requests   Final    BOTTLES DRAWN AEROBIC AND ANAEROBIC Blood Culture adequate volume Performed at [place] [address] [place], [place] [address]    Culture   Final    NO GROWTH 4 DAYS Performed at [facility], [place] [address], [place], [place] [address]    Report Status  PENDING  Incomplete  MRSA PCR Screening     Status: None   Collection Time: 08/22/20  1:40 PM  Result Value Ref Range Status   MRSA by PCR NEGATIVE NEGATIVE Final    Comment:        The GeneXpert MRSA Assay (FDA approved for NASAL specimens only), is one component of a comprehensive MRSA colonization surveillance program. It is not intended to diagnose MRSA infection nor to guide or monitor treatment for MRSA infections. Performed at [facility] [place], [place] [address] [place], [place] [address]     Radiology Studies: [facility] Chest Port 1 View  Result Date: 08/25/2020 CLINICAL DATA:  Dyspnea EXAM: PORTABLE CHEST 1 VIEW COMPARISON:  08/24/2020 FINDINGS: Cardiac shadow is stable. Increasing vascular congestion is noted with patchy airspace disease bilaterally also increased in the interval from the prior exam. These changes likely represent edema although developing pneumonia would deserve consideration as well. No bony abnormality is seen. IMPRESSION: Increasing vascular congestion and patchy airspace disease bilaterally. Edema and multifocal infiltrate again are primary considerations. Electronically Signed   By: Elta Guadeloupe  Lukens M.D.   On: 08/25/2020 15:29      Hana Trippett T. [place]  If 7PM-7AM, please contact night-coverage [email or web address] 08/26/2020, 10:33 AM

## 2020-08-26 NOTE — TOC Progression Note (Signed)
Transition of Care [facility]) - Progression Note    Patient Details  Name: Eric Dennis MRN: [id number] Date of Birth: 23-Mar-1965  [facility] CM/SW Contact  Leeroy Cha, RN Phone Number: 08/26/2020, 8:00 AM  Clinical Narrative:    Assessment & Plan: ABLA likely due to UGIB and slow bleed from recent dental extraction site in the setting of coagulopathy from alcoholic cirrhosis: H&H stable after 3 units. Recent Labs (within last 365 days)            Recent Labs    08/22/20 1031 08/22/20 1356 08/22/20 2231 08/23/20 0926 08/24/20 0254 08/24/20 0849 08/24/20 1809 08/25/20 0458  HGB 4.3*  4.4* 4.4* 5.3* 7.0* 6.8* 7.7* 7.5* 7.9*    -S/p clot evacuation from extraction site by oral surgery on 6/18 -Coagulopathy persisted despite vitamin K and FFP.  Give additional vitamin K and FFP today -Continue PPI and octreotide per GI -SCD for VTE prophylaxis -Plan for EGD by GI once INR close to 2.0.   Decompensated alcoholic liver cirrhosis with ascites-low suspicion for SBP based on exam Hyperbilirubinemia/elevated AST-total bili elevated increased to 18.6 today. Coagulopathy-likely due to alcohol and cirrhosis.  Persistent despite IV vitamin K and FFP Mild hepatic encephalopathy: More alert today.  Ammonia elevated but improving PLAN: following for progression HGB=7.7 062022-rec'ing 1 unit FPP. From home plan is to return home  Expected Discharge Plan: Home/Self Care Barriers to Discharge: Continued Medical Work up  Expected Discharge Plan and Services Expected Discharge Plan: Home/Self Care       Living arrangements for the past 2 months: Single Family Home                                       Social Determinants of Health (SDOH) Interventions    Readmission Risk Interventions No flowsheet data found.

## 2020-08-26 NOTE — Progress Notes (Signed)
Stable from our standpoint.    Patient lying in bed in no distress.  Abdomen is somewhat protuberant but no evident ascites based on physical exam.  No peripheral edema.  Abdomen is nontender.  Patient is awake and coherent, not evidently encephalopathic.  Does not look as jaundice as one would expect with his bilirubin of 18.    Labs:   LFTs remain stable, mild transaminitis with OT/PT split consistent with alcohol induced liver disease.  Bilirubin markedly elevated at 18.  Hemoglobin is low but stable, 7.7.  Stools are dark green, not melenic in character.  BUN is normal.  Impression:  1.  Advanced alcohol induced liver disease with radiographic evidence of cirrhosis based on CT scan 6 months ago.  No obvious encephalopathy or ascites.  2.  Recent dental extraction with prolonged bleeding.  However, by a current appearance of stool and by BUN and hemoglobin levels, it does not appear the patient is having persistent bleeding at this time  3.  Coagulopathy, with thrombocytopenia (current platelets 52,000) and elevated INR (2.6) presumably contributing to prolonged bleeding from dental extraction    Recommendations:  1.  I think it would be reasonable to allow transfer out of the stepdown unit, at the discretion of his hospitalist, at least from the standpoint of bleeding  2.  No specific interventions needed from our standpoint at this time.  We might consider endoscopic evaluation when patient's condition is optimized prior to discharge, by which time hopefully both his platelet count and his INR will be improved.  We could do it at any time in the meantime, in the event of acute bleeding, and for that matter, we could even do it with the current coagulation parameters prior to discharge if they do not improve.  However, the question then would be, however, in the absence of further bleeding, whether useful information would be obtained from endoscopy.  Probably its main benefit would be to  screen for varices, although it is unclear if the patient would adhere to a regimen of prophylactic beta-blocker therapy.  Eric Dennis, M.D. Pager [phone] If no answer or after 5 PM call [phone]

## 2020-08-26 NOTE — Progress Notes (Signed)
Patient rapidly desat into the 80's. Upon entering room patient was found to have removed his nasal canula and had to be shaken awake. Patient is sleeping with mouth open so I retrieved a NRB as this may be more appropriate while he sleeps. Oxygen recovered fairly quickly and oxygen is holding in the high 90's with NRB. Spoke to RRT briefly, agreed to contact them if he has another major episode again dependent on mentation at that time.

## 2020-08-26 NOTE — Consult Note (Signed)
Consulted due to persistent bleeding in LL. Patient admitted at [facility], had #19 removed on Monday by general dentist and reports tooth has been bleeding since then. Admitted due suspected GI bleed but possibly related to this extraction site. Pt is cirrhotic and coagulopathic. On exam slow ooze from site #19, non-hemostatic. Remaining teeth are grossly mobile however hemostatic. Verbal consent obtained and local anesthetic given (4 cc 2perc lido plain). Site #19 was curetted, irrigated, gelfoam placed and 3-0 gut suture placed to aid in hemostasis. Instructed gauze pressure   Terese Door, DDS Oral and Maxillofacial Surgeon [facility] ([place]) Office # [phone] Cell # [phone]

## 2020-08-26 NOTE — Consult Note (Signed)
   NAME:  Eric Dennis, MRN:  [id number], DOB:  03-15-1965, LOS: 4 ADMISSION DATE:  08/22/2020, CONSULTATION DATE:  08/26/2020 REFERRING MD:  Dr. Cyndia Skeeters, CHIEF COMPLAINT: Persistent hypoxic respiratory failure  Brief Narrative  55 year old male with a past medical history significant for alcoholic cirrhosis with ongoing alcohol abuse who presented to the emergency department due to abnormal lab work seen in the outpatient setting.  Patient was seen with.  Patient recently had dental extraction with excessive bleeding secondary to coagulopathy.  Patient was initially admitted per hospitalist services for treatment of decompensated liver failure and acute blood loss anemia with supratherapeutic INR. Additionally patient was seen with acute hypoxic respiratory failure concerning for possible aspiration pneumonia.   PCCM pulmonary consulted 6/24 management of continued acute hypoxic respiratory failure.  Pertinent  Medical History  Alcoholic cirrhosis Alcohol abuse Hyperlipidemia  Significant Hospital Events: Including procedures, antibiotic start and stop dates in addition to other pertinent events   6/16 admitted for treatment of decompensated liver failure and acute blood loss anemia 6/20 PCCM consulted for persistent hypoxia  Interim History / Subjective:  Seen lying in bed on left side in no acute distress States he feels Spouse at bedside and updated  Objective   Blood pressure (!) 149/75, pulse (!) 107, temperature 98.2 F (36.8 C), temperature source Oral, resp. rate (!) 36, height 6\' 1"  (1.854 m), weight 83.7 kg, SpO2 94 %.        Intake/Output Summary (Last 24 hours) at 08/26/2020 1302 Last data filed at 08/26/2020 1200 Gross per 24 hour  Intake 1538.86 ml  Output 3350 ml  Net -1811.14 ml   Filed Weights   08/22/20 1018 08/26/20 0500  Weight: 81.2 kg 83.7 kg    Examination: General: Decompensated middle-aged male lying in bed in no acute distress HENT: [place]/AT, mucous  membranes pink/moist Neuro: Sleepy but will arouse to verbal stimuli, able to follow commands and interact with assessment Lungs: Coarse bilaterally, on 10 L nasal cannula, no increased work of breathing CV: s1s2 regular rate and rhythm, no murmur, rubs, or gallops,  GI: soft, bowel sounds active in all 4 quadrants, non-tender, non-distended Extremities: warm/dry, no edema  Skin: no rashes or lesions  Labs/imaging that I havepersonally reviewed   6/20 CXR Slight interval worsening of extensive bilateral airspace disease. Small left effusion and dense left lung base consolidation  Resolved Hospital Problem list     Assessment & Plan:  Acute hypoxic respiratory failure -Respiratory status waxes and wanes it appears patient has required anywhere between 6 to 15 L high flow nasal cannula Aspiration pneumonitis -Likely secondary to excessive bleeding post dental extraction Decompensated alcoholic cirrhosis P: Continue supplemental oxygen and wean for sats greater than 90% Patient needs to mobilize, encourage out of bed twice daily Aggressive pulmonary hygiene including frequent IS and flutter valve Avoid sedating medications Monitor for alcohol withdrawal Continue lactulose If mentation were to worsen can consider ABG, currently patient able to awaken with verbal stimuli only and interact with interviewer appropriately  Best Practice  Per Primary     Critical care time:  Penni Penado D. Kenton Kingfisher, NP-C [facility] Personal contact information can be found on Amion  08/26/2020, 5:15 PM

## 2020-08-26 NOTE — Plan of Care (Signed)
Patient remains on HFNC this AM. Passing stools. CLD. No acute distress.    Problem: Education: Goal: Knowledge of General Education information will improve Description: Including pain rating scale, medication(s)/side effects and non-pharmacologic comfort measures Outcome: Progressing   Problem: Health Behavior/Discharge Planning: Goal: Ability to manage health-related needs will improve Outcome: Progressing   Problem: Clinical Measurements: Goal: Ability to maintain clinical measurements within normal limits will improve Outcome: Progressing Goal: Will remain free from infection Outcome: Progressing Goal: Diagnostic test results will improve Outcome: Progressing Goal: Respiratory complications will improve Outcome: Progressing Goal: Cardiovascular complication will be avoided Outcome: Progressing   Problem: Activity: Goal: Risk for activity intolerance will decrease Outcome: Progressing   Problem: Nutrition: Goal: Adequate nutrition will be maintained Outcome: Progressing   Problem: Coping: Goal: Level of anxiety will decrease Outcome: Progressing   Problem: Elimination: Goal: Will not experience complications related to bowel motility Outcome: Progressing Goal: Will not experience complications related to urinary retention Outcome: Progressing   Problem: Pain Managment: Goal: General experience of comfort will improve Outcome: Progressing   Problem: Safety: Goal: Ability to remain free from injury will improve Outcome: Progressing   Problem: Skin Integrity: Goal: Risk for impaired skin integrity will decrease Outcome: Progressing

## 2020-08-26 NOTE — Progress Notes (Signed)
  Echocardiogram 2D Echocardiogram has been performed.  Eric Dennis 08/26/2020, 12:50 PM

## 2020-08-26 NOTE — Progress Notes (Addendum)
Pharmacy Antibiotic Note  Eric Dennis is a 55 y.o. male admitted on 08/22/2020 with GIB.  Pharmacy has been consulted for ampicillin/sulbactam dosing for aspiration PNA.  Plan: Continue Ampicillin/sulbactam 3 g IV q6h Follow renal function Dosage remains stable and need for further dosage adjustment appears unlikely at present.  Pharmacy will sign off at this time, but will continue to monitor renal function peripherally.      Height: 6\' 1"  (185.4 cm) Weight: 83.7 kg (184 lb 8.4 oz) IBW/kg (Calculated) : 79.9  Temp (24hrs), Avg:99.2 F (37.3 C), Min:98.2 F (36.8 C), Max:100.2 F (37.9 C)  Recent Labs  Lab 08/22/20 1031 08/22/20 1224 08/22/20 1356 08/22/20 2231 08/23/20 0926 08/23/20 1222 08/23/20 1449 08/24/20 0254 08/24/20 1809 08/25/20 0254 08/25/20 0458 08/25/20 1439 08/26/20 0315 08/26/20 0828  WBC 11.7*  12.5*  --  10.9*   < > 9.1  --   --  8.7 10.2  --  10.5 10.6* 9.3  --   CREATININE 2.06*  --   --   --  1.02  --   --  0.58*  --  0.35*  --   --   --  0.49*  LATICACIDVEN  --  4.5* 4.4*  --   --  2.1* 3.3*  --  2.0*  --   --   --   --   --    < > = values in this interval not displayed.     Estimated Creatinine Clearance: 116.5 mL/min (A) (by C-G formula based on SCr of 0.49 mg/dL (L)).    No Known Allergies  Antimicrobials this admission: Ampicillin/sulbactam 6/16 >> Doxycycline 6/18 >>   Dose adjustments this admission:  Microbiology results: 6/16 BCx: ngtd 6/16 MRSA PCR: neg  Thank you for allowing pharmacy to be a part of this patient's care.  Gretta Arab PharmD, BCPS Clinical Pharmacist [facility] main pharmacy [phone] 08/26/2020 11:38 AM

## 2020-08-27 DIAGNOSIS — J9601 Acute respiratory failure with hypoxia: Secondary | ICD-10-CM | POA: Diagnosis not present

## 2020-08-27 DIAGNOSIS — K922 Gastrointestinal hemorrhage, unspecified: Secondary | ICD-10-CM | POA: Diagnosis not present

## 2020-08-27 DIAGNOSIS — J690 Pneumonitis due to inhalation of food and vomit: Secondary | ICD-10-CM | POA: Diagnosis not present

## 2020-08-27 LAB — COMPREHENSIVE METABOLIC PANEL
ALT: 28 U/L (ref 0–44)
AST: 53 U/L — ABNORMAL HIGH (ref 15–41)
Albumin: 2.2 g/dL — ABNORMAL LOW (ref 3.5–5.0)
Alkaline Phosphatase: 54 U/L (ref 38–126)
Anion gap: 7 (ref 5–15)
BUN: 13 mg/dL (ref 6–20)
CO2: 35 mmol/L — ABNORMAL HIGH (ref 22–32)
Calcium: 8.3 mg/dL — ABNORMAL LOW (ref 8.9–10.3)
Chloride: 91 mmol/L — ABNORMAL LOW (ref 98–111)
Creatinine, Ser: 0.45 mg/dL — ABNORMAL LOW (ref 0.61–1.24)
GFR, Estimated: >60 mL/min (ref >60)
Glucose, Bld: 118 mg/dL — ABNORMAL HIGH (ref 70–99)
Potassium: 4.2 mmol/L (ref 3.5–5.1)
Sodium: 133 mmol/L — ABNORMAL LOW (ref 135–145)
Total Bilirubin: 15.9 mg/dL — ABNORMAL HIGH (ref 0.3–1.2)
Total Protein: 6.2 g/dL — ABNORMAL LOW (ref 6.5–8.1)

## 2020-08-27 LAB — PROTIME-INR
INR: 2.8 — ABNORMAL HIGH (ref 0.8–1.2)
Prothrombin Time: 29.6 seconds — ABNORMAL HIGH (ref 11.4–15.2)

## 2020-08-27 LAB — CULTURE, BLOOD (ROUTINE X 2)
Culture: NO GROWTH
Culture: NO GROWTH
Special Requests: ADEQUATE
Special Requests: ADEQUATE

## 2020-08-27 LAB — APTT: aPTT: 51 seconds — ABNORMAL HIGH (ref 24–36)

## 2020-08-27 LAB — PHOSPHORUS: Phosphorus: 2.7 mg/dL (ref 2.5–4.6)

## 2020-08-27 LAB — AMMONIA: Ammonia: 52 umol/L — ABNORMAL HIGH (ref 9–35)

## 2020-08-27 LAB — MAGNESIUM: Magnesium: 1.6 mg/dL — ABNORMAL LOW (ref 1.7–2.4)

## 2020-08-27 MED ORDER — SODIUM CHLORIDE 0.9 % IV SOLN
3.0000 g | Freq: Four times a day (QID) | INTRAVENOUS | Status: AC
  Administered 2020-08-27 – 2020-08-29 (×8): 3 g via INTRAVENOUS
  Filled 2020-08-27 (×3): qty 3
  Filled 2020-08-27: qty 8
  Filled 2020-08-27 (×2): qty 3
  Filled 2020-08-27: qty 8
  Filled 2020-08-27: qty 3
  Filled 2020-08-27: qty 8

## 2020-08-27 MED ORDER — MAGNESIUM SULFATE 4 GM/100ML IV SOLN
4.0000 g | Freq: Once | INTRAVENOUS | Status: AC
Start: 2020-08-27 — End: 2020-08-27
  Administered 2020-08-27: 4 g via INTRAVENOUS
  Filled 2020-08-27: qty 100

## 2020-08-27 MED ORDER — MAGNESIUM SULFATE 2 GM/50ML IV SOLN: 2.0000 g | Freq: Once | INTRAVENOUS | Status: DC

## 2020-08-27 MED ORDER — LACTULOSE 10 GM/15ML PO SOLN
15.0000 g | Freq: Two times a day (BID) | ORAL | Status: DC
  Administered 2020-08-27 – 2020-08-31 (×8): 15 g via ORAL
  Filled 2020-08-27 (×9): qty 30

## 2020-08-27 MED ORDER — PANTOPRAZOLE SODIUM 40 MG IV SOLR
40.0000 mg | Freq: Two times a day (BID) | INTRAVENOUS | Status: DC
  Administered 2020-08-27 – 2020-08-31 (×9): 40 mg via INTRAVENOUS
  Filled 2020-08-27 (×10): qty 40

## 2020-08-27 MED ORDER — MAGNESIUM CHLORIDE 64 MG PO TBEC
2.0000 | DELAYED_RELEASE_TABLET | Freq: Every day | ORAL | Status: DC
  Administered 2020-08-27 – 2020-08-31 (×5): 128 mg via ORAL
  Filled 2020-08-27 (×7): qty 2

## 2020-08-27 NOTE — Progress Notes (Signed)
PCCM note reviewed.  Hemoglobin not repeated today.  Remains severely coagulopathic.  Bilirubin trending down.  Impression: As per yesterday.  It does not appear likely that this patient is having GI bleeding, although he is certainly at risk for it.  Plan:   1.  Have stopped octreotide   2.  Have changed Protonix infusion to every 12 hour IV boluses.    3.  No other new suggestions from our standpoint.  We will follow at a distance.  Please call us if further input from [place] would be helpful.  As noted yesterday, it might be reasonable to consider endoscopic evaluation prior to discharge, although the benefit of it would be limited if the patient will not be compliant with follow-up.  Please let us know if you would like endoscopic evaluation on this patient.  Also, if this patient has follow-up with her primary physician following discharge, they could potentially attend to colon cancer screening issues, such as Hemoccult testing, if it is deemed clinically appropriate in view of the patient's overall health status.  Cleotis Nipper, M.D. Pager [phone] If no answer or after 5 PM call [phone]

## 2020-08-27 NOTE — Progress Notes (Signed)
PROGRESS NOTE  YONAH TANGEMAN SNK:[id number] DOB: April 03, 1965   PCP: Scheryl Marten, PA  Patient is from: Home.  Lives with his wife.  DOA: 08/22/2020 LOS: 5  Chief complaints: Abnormal labs  Brief Narrative / Interim history: 55 year old M with PMH of alcoholic cirrhosis, alcohol abuse and tobacco use disorder directed to ED for low hemoglobin to 4.9.  Had melena, abdominal pain and distention for 2 to 3 weeks.  Also had bleeding from recent dental extraction.  Patient was admitted for decompensated liver failure and acute blood loss anemia with supratherapeutic INR, hyperbilirubinemia to 13.4 and acute hypoxic respiratory failure with concern for possible pneumonia.  Transfused 3 units with appropriate response.  Received vitamin K.  Started on IV antibiotics, IV PPI and octreotide.  Coagulopathy persisted despite IV vitamin K and FFP.  GI following.  Subjective: Seen and examined this morning.  Patient says breathing is stable. No fevers. Tolerating diet. Says abdominal distention slowly improving. Denies pain. Oozing in mouth continues but reports improvement from prior. 4 bms a day.   Objective: Vitals:   08/27/20 0400 08/27/20 0500 08/27/20 0749 08/27/20 0800  BP: (!) 106/59  (!) 132/55 124/69  Pulse: 81  86 87  Resp: (!) 24   (!) 26  Temp: 98.1 F (36.7 C)   97.7 F (36.5 C)  TempSrc: Oral   Axillary  SpO2: 100%   99%  Weight:  91.4 kg    Height:        Intake/Output Summary (Last 24 hours) at 08/27/2020 1043 Last data filed at 08/27/2020 0800 Gross per 24 hour  Intake 1477.05 ml  Output 1900 ml  Net -422.95 ml   Filed Weights   08/22/20 1018 08/26/20 0500 08/27/20 0500  Weight: 81.2 kg 83.7 kg 91.4 kg    Examination:  GENERAL: No apparent distress.  Nontoxic. HEENT: MMM.  Poor dentition.  Gauze pack with blood oozing NECK: Supple.   RESP: 93% on 6 L.  No IWOB.  Rhonchi bilaterally. CVS:  RRR. Heart sounds normal.  ABD/GI/GU: BS+.  Slightly distended but  soft.  Nontender. MSK/EXT:  Moves extremities. No apparent deformity. No edema.  SKIN: Jaundice  NEURO: Awake and alert. Oriented appropriately.  No apparent focal neuro deficit. PSYCH: Calm. Normal affect.   Procedures:  None  Microbiology summarized: PFXTK-24 and influenza PCR nonreactive. MRSA PCR screen negative. Blood cultures NGTD.  Assessment & Plan: Acute blood loss anemia likely due to UGIB and slow bleed from recent dental extraction site in the setting of coagulopathy from alcoholic cirrhosis: H&H stable after 3 units. Recent Labs    08/22/20 1031 08/22/20 1356 08/22/20 2231 08/23/20 0926 08/24/20 0254 08/24/20 0849 08/24/20 1809 08/25/20 0458 08/25/20 1439 08/26/20 0315  HGB 4.3*  4.4* 4.4* 5.3* 7.0* 6.8* 7.7* 7.5* 7.9* 7.3* 7.7*  -S/p clot evacuation and suture of extraction site by oral surgery on 6/18 -Coagulopathy persisted despite rounds of vitamin K and FFP.  -Continue PPI and octreotide per GI -SCD for VTE prophylaxis -Plan for EGD by GI at some point  Decompensated alcoholic liver cirrhosis with ascites-low suspicion for SBP based on exam Hyperbilirubinemia/elevated AST- Coagulopathy-likely due to alcohol and cirrhosis.  Persistent despite IV vitamin K and FFP Hepatic encephalopathy: resolved w/ lactulose. Recent Labs  Lab 08/23/20 0926 08/24/20 0254 08/25/20 0254 08/26/20 0315 08/26/20 0828 08/27/20 0222  AST 66* 56* 61* 50*  --  53*  ALT 33 31 29 29   --  28  ALKPHOS 71 65 61 61  --  6  BILITOT 17.0* 18.7* 18.6* 18.0*  --  15.9*  PROT 6.0* 6.6 6.4* 6.6  --  6.2*  ALBUMIN 1.9* 2.5* 2.4* 2.4* 2.3* 2.2*  -MELD-Na score of 31 with 27 to 32% 90-day mortality -Continue CIWA with as needed Ativan-no withdrawal  -Continue lactulose (decrease from 20 to 15 bid) -Continue monitoring -Continue thiamine, folic acid and multivitamins -GI following.  ARF with hypoxia in the setting of possible aspiration pneumonitis versus pneumonia: Now requiring  6 L.  CXR with worsening right-sided infiltrate.  Pro-Cal 0.35>> 0.10> 0.25 -Wean oxygen as able -Continue IV Unasyn (6/16> ), d/c doxycycline.  -Continue IV Lasix 40 mg bid -Aspiration precautions - continue breathing treatments   AKI/azotemia: Resolved. Recent Labs    08/22/20 1031 08/23/20 0926 08/24/20 0254 08/25/20 0254 08/26/20 0828 08/27/20 0222  BUN 28* 18 11 9 10 13   CREATININE 2.06* 1.02 0.58* 0.35* 0.49* 0.45*  -Continue monitoring  Lactic acidosis/hypotension: Resolved  Hyponatremia: Likely due to cirrhosis. Mild and stable -Continue monitoring.   Hypokalemia/hypomagnesemia: Hypokalemia resolved.  Mg 1.6. -IV magnesium sulfate 2 g x 1 -Monitor replenish as appropriate - start oral Mag   Macrocytosis: Likely due to alcohol.  C62 and folic acid within normal. -Encouraged alcohol cessation   Thrombocytopenia: Likely due to alcohol and cirrhosis. Recent Labs  Lab 08/22/20 1031 08/22/20 1356 08/22/20 2231 08/23/20 0926 08/24/20 0254 08/24/20 1809 08/25/20 0458 08/25/20 1439 08/26/20 0315  PLT 88*  98* 72* 71* 82* 76* 74* 71* 68* 52*  -Continue monitoring   Tobacco abuse:  -Encourage cessation. -We will add nicotine patch  Dental caries with recent dental extraction: oozing from recent dental extraction likely due to coagulopathy and thrombocytopenia. -Correct coagulopathy as above. -Appreciate help by oral surgery.    Body mass index is 26.58 kg/m.         DVT prophylaxis:  SCDs Start: 08/22/20 1218  Code Status: Full code Family Communication: Updated patient's wife at bedside on 6/18.  None at bedside today. Level of care: Stepdown Status is: Inpatient  Remains inpatient appropriate because:Persistent severe electrolyte disturbances, Ongoing diagnostic testing needed not appropriate for outpatient work up, IV treatments appropriate due to intensity of illness or inability to take PO, and Inpatient level of care appropriate due to  severity of illness  Dispo: The patient is from: Home              Anticipated d/c is to: tbd              Patient currently is not medically stable to d/c.   Difficult to place patient No       Consultants:  Gastroenterology Oral surgery   Sch Meds:  Scheduled Meds:  carvedilol  6.25 mg Oral BID WC   Chlorhexidine Gluconate Cloth  6 each Topical Daily   doxycycline  100 mg Oral B76E   folic acid  1 mg Intravenous Daily   furosemide  40 mg Intravenous BID   lactulose  20 g Oral BID   mouth rinse  15 mL Mouth Rinse BID   multivitamin  15 mL Oral Daily   nicotine  21 mg Transdermal Daily   [START ON 09/05/2020] thiamine injection  100 mg Intravenous Daily   Continuous Infusions:  sodium chloride 10 mL/hr at 08/26/20 1800   ampicillin-sulbactam (UNASYN) IV Stopped (08/27/20 8315)   octreotide  (SANDOSTATIN)    IV infusion 50 mcg/hr (08/27/20 0800)   pantoprazole 8 mg/hr (08/27/20 0800)   thiamine injection Stopped (08/27/20  0954)   Followed by   Derrill Memo ON 08/31/2020] thiamine injection     PRN Meds:.sodium chloride, acetaminophen **OR** acetaminophen, ipratropium-albuterol, lip balm, ondansetron **OR** ondansetron (ZOFRAN) IV  Antimicrobials: Anti-infectives (From admission, onward)    Start     Dose/Rate Route Frequency Ordered Stop   08/24/20 2200  doxycycline (VIBRA-TABS) tablet 100 mg        100 mg Oral Every 12 hours 08/24/20 1654     08/22/20 1800  Ampicillin-Sulbactam (UNASYN) 3 g in sodium chloride 0.9 % 100 mL IVPB        3 g 200 mL/hr over 30 Minutes Intravenous Every 6 hours 08/22/20 1302     08/22/20 1200  Ampicillin-Sulbactam (UNASYN) 3 g in sodium chloride 0.9 % 100 mL IVPB        3 g 200 mL/hr over 30 Minutes Intravenous  Once 08/22/20 1158 08/22/20 1351        I have personally reviewed the following labs and images: CBC: Recent Labs  Lab 08/22/20 1031 08/22/20 1356 08/24/20 0254 08/24/20 0849 08/24/20 1809 08/25/20 0458 08/25/20 1439  08/26/20 0315  WBC 11.7*  12.5*   < > 8.7  --  10.2 10.5 10.6* 9.3  NEUTROABS 8.5*  --   --   --   --   --   --   --   HGB 4.3*  4.4*   < > 6.8* 7.7* 7.5* 7.9* 7.3* 7.7*  HCT 13.0*  11.9*   < > 19.7* 22.3* 21.9* 22.2* 19.6* 23.0*  MCV 146.1*  143.4*   < > 111.9*  --  105.8* 109.4* 112.6* 107.5*  PLT 88*  98*   < > 76*  --  74* 71* 68* 52*   < > = values in this interval not displayed.   BMP &GFR Recent Labs  Lab 08/23/20 0926 08/24/20 0254 08/25/20 0254 08/26/20 0315 08/26/20 0828 08/27/20 0222  NA 135 134* 135  --  131* 133*  K 3.8 3.9 4.5  --  4.2 4.2  CL 97* 98 96*  --  93* 91*  CO2 31 30 33*  --  30 35*  GLUCOSE 95 125* 113*  --  112* 118*  BUN 18 11 9   --  10 13  CREATININE 1.02 0.58* 0.35*  --  0.49* 0.45*  CALCIUM 8.3* 8.5* 8.5*  --  8.4* 8.3*  MG 1.9 1.7 1.4* 1.6*  --  1.6*  PHOS 4.1 2.7 2.6 2.6 2.6 2.7   Estimated Creatinine Clearance: 116.5 mL/min (A) (by C-G formula based on SCr of 0.45 mg/dL (L)). Liver & Pancreas: Recent Labs  Lab 08/23/20 0926 08/24/20 0254 08/25/20 0254 08/26/20 0315 08/26/20 0828 08/27/20 0222  AST 66* 56* 61* 50*  --  53*  ALT 33 31 29 29   --  28  ALKPHOS 71 65 61 61  --  54  BILITOT 17.0* 18.7* 18.6* 18.0*  --  15.9*  PROT 6.0* 6.6 6.4* 6.6  --  6.2*  ALBUMIN 1.9* 2.5* 2.4* 2.4* 2.3* 2.2*   No results for input(s): LIPASE, AMYLASE in the last 168 hours. Recent Labs  Lab 08/23/20 1222 08/24/20 0254 08/26/20 0315 08/27/20 0222  AMMONIA 65* 58* 45* 52*   Diabetic: No results for input(s): HGBA1C in the last 72 hours. No results for input(s): GLUCAP in the last 168 hours. Cardiac Enzymes: No results for input(s): CKTOTAL, CKMB, CKMBINDEX, TROPONINI in the last 168 hours. No results for input(s): PROBNP in the last 8760 hours. Coagulation  Profile: Recent Labs  Lab 08/24/20 0254 08/24/20 1809 08/25/20 0254 08/26/20 0315 08/27/20 0222  INR 2.7* 2.4* 2.8* 2.6* 2.8*   Thyroid Function Tests: No results for  input(s): TSH, T4TOTAL, FREET4, T3FREE, THYROIDAB in the last 72 hours. Lipid Profile: No results for input(s): CHOL, HDL, LDLCALC, TRIG, CHOLHDL, LDLDIRECT in the last 72 hours. Anemia Panel: No results for input(s): VITAMINB12, FOLATE, FERRITIN, TIBC, IRON, RETICCTPCT in the last 72 hours.  Urine analysis:    Component Value Date/Time   COLORURINE AMBER (A) 10/07/2018 1615   APPEARANCEUR HAZY (A) 10/07/2018 1615   LABSPEC 1.025 10/07/2018 1615   PHURINE 6.0 10/07/2018 1615   GLUCOSEU NEGATIVE 10/07/2018 1615   HGBUR NEGATIVE 10/07/2018 1615   BILIRUBINUR MODERATE (A) 10/07/2018 1615   KETONESUR NEGATIVE 10/07/2018 1615   PROTEINUR NEGATIVE 10/07/2018 1615   NITRITE NEGATIVE 10/07/2018 1615   LEUKOCYTESUR NEGATIVE 10/07/2018 1615   Sepsis Labs: Invalid input(s): PROCALCITONIN, [place]  Microbiology: Recent Results (from the past 240 hour(s))  Resp Panel by RT-PCR (Flu A&B, Covid) Nasopharyngeal Swab     Status: None   Collection Time: 08/22/20 10:55 AM   Specimen: Nasopharyngeal Swab; Nasopharyngeal(NP) swabs in vial transport medium  Result Value Ref Range Status   SARS Coronavirus 2 by RT PCR NEGATIVE NEGATIVE Final    Comment: (NOTE) SARS-CoV-2 target nucleic acids are NOT DETECTED.  The SARS-CoV-2 RNA is generally detectable in upper respiratory specimens during the acute phase of infection. The lowest concentration of SARS-CoV-2 viral copies this assay can detect is 138 copies/mL. A negative result does not preclude SARS-Cov-2 infection and should not be used as the sole basis for treatment or other patient management decisions. A negative result may occur with  improper specimen collection/handling, submission of specimen other than nasopharyngeal swab, presence of viral mutation(s) within the areas targeted by this assay, and inadequate number of viral copies(<138 copies/mL). A negative result must be combined with clinical observations, patient history, and  epidemiological information. The expected result is Negative.  Fact Sheet for Patients:  EntrepreneurPulse.com.au  Fact Sheet for Healthcare Providers:  IncredibleEmployment.be  This test is no t yet approved or cleared by the [place] FDA and  has been authorized for detection and/or diagnosis of SARS-CoV-2 by FDA under an Emergency Use Authorization (EUA). This EUA will remain  in effect (meaning this test can be used) for the duration of the COVID-19 declaration under Section 564(b)(1) of the Act, 21 U.S.C.section 360bbb-3(b)(1), unless the authorization is terminated  or revoked sooner.       Influenza A by PCR NEGATIVE NEGATIVE Final   Influenza B by PCR NEGATIVE NEGATIVE Final    Comment: (NOTE) The Xpert Xpress SARS-CoV-2/FLU/RSV plus assay is intended as an aid in the diagnosis of influenza from Nasopharyngeal swab specimens and should not be used as a sole basis for treatment. Nasal washings and aspirates are unacceptable for Xpert Xpress SARS-CoV-2/FLU/RSV testing.  Fact Sheet for Patients: EntrepreneurPulse.com.au  Fact Sheet for Healthcare Providers: IncredibleEmployment.be  This test is not yet approved or cleared by the [place] FDA and has been authorized for detection and/or diagnosis of SARS-CoV-2 by FDA under an Emergency Use Authorization (EUA). This EUA will remain in effect (meaning this test can be used) for the duration of the COVID-19 declaration under Section 564(b)(1) of the Act, 21 U.S.C. section 360bbb-3(b)(1), unless the authorization is terminated or revoked.  Performed at [facility], [place] [address] [place],  [address]   Culture, blood (routine  x 2)     Status: None   Collection Time: 08/22/20 12:04 PM   Specimen: BLOOD LEFT FOREARM  Result Value Ref Range Status   Specimen Description   Final    BLOOD LEFT FOREARM Performed at  [facility], [address], [place], [place] [address]    Special Requests   Final    BOTTLES DRAWN AEROBIC AND ANAEROBIC Blood Culture adequate volume Performed at [place] [place] [address], [place], [place] [address]    Culture   Final    NO GROWTH 5 DAYS Performed at [facility], [place] [address] [place], [place] [address]    Report Status 08/27/2020 FINAL  Final  Culture, blood (routine x 2)     Status: None   Collection Time: 08/22/20 12:05 PM   Specimen: BLOOD RIGHT HAND  Result Value Ref Range Status   Specimen Description   Final    BLOOD RIGHT HAND Performed at [facility] [address] [place], [place] [address]    Special Requests   Final    BOTTLES DRAWN AEROBIC AND ANAEROBIC Blood Culture adequate volume Performed at [facility] [address] [place], [place] [address]    Culture   Final    NO GROWTH 5 DAYS Performed at [facility], [place] [address] [place], [place] [address]    Report Status 08/27/2020 FINAL  Final  MRSA PCR Screening     Status: None   Collection Time: 08/22/20  1:40 PM  Result Value Ref Range Status   MRSA by PCR NEGATIVE NEGATIVE Final    Comment:        The GeneXpert MRSA Assay (FDA approved for NASAL specimens only), is one component of a comprehensive MRSA colonization surveillance program. It is not intended to diagnose MRSA infection nor to guide or monitor treatment for MRSA infections. Performed at [facility], [place] [address], [place], [place] [address]     Radiology Studies: [facility] Chest Port 1 View  Result Date: 08/26/2020 CLINICAL DATA:  Tachypnea EXAM: PORTABLE CHEST 1 VIEW COMPARISON:  08/25/2020, 08/24/2020, 08/22/2020 FINDINGS: Extensive bilateral ground-glass opacities and patchy consolidations with dense left lung base consolidation. Probable small left effusion. Slight interval worsening since 08/25/2020. Stable  cardiomediastinal silhouette. No pneumothorax. IMPRESSION: Slight interval worsening of extensive bilateral airspace disease. Small left effusion and dense left lung base consolidation. Electronically Signed   By: Donavan Foil M.D.   On: 08/26/2020 15:18   ECHOCARDIOGRAM LIMITED  Result Date: 08/26/2020    ECHOCARDIOGRAM LIMITED REPORT   Patient Name:   Eric Dennis Date of Exam: 08/26/2020 Medical Rec #:  [id number]       Height:       73.0 in Accession #:    [id number]      Weight:       184.5 lb Date of Birth:  Feb 20, 1966       BSA:          2.079 m Patient Age:    89 years        BP:           149/75 mmHg Patient Gender: M               HR:           106 bpm. Exam Location:  Inpatient Procedure: Limited Echo, Limited Color Doppler and Cardiac Doppler Indications:    Dyspnea R06.00  History:  Patient has prior history of Echocardiogram examinations, most                 recent 10/08/2018. Risk Factors:Hypertension.  Sonographer:    Tiffany Dance Referring Phys: [id number] Charlesetta Ivory GONFA IMPRESSIONS  1. Left ventricular ejection fraction, by estimation, is 55 to 60%. The left ventricle has normal function. There is mild left ventricular hypertrophy.  2. The pericardial effusion is posterior to the left ventricle. Moderate pleural effusion in the left lateral region.  3. The mitral valve is abnormal. Trivial mitral valve regurgitation.  4. Aortic dilatation noted. There is mild dilatation of the aortic root, measuring 39 mm.  5. There is normal pulmonary artery systolic pressure.  6. The inferior vena cava is normal in size with <50% respiratory variability, suggesting right atrial pressure of 8 mmHg. Comparison(s): Changes from prior study are noted. 10/08/2018: LVEF 50-55%. FINDINGS  Left Ventricle: Left ventricular ejection fraction, by estimation, is 55 to 60%. The left ventricle has normal function. There is mild left ventricular hypertrophy. Right Ventricle: There is normal pulmonary artery systolic  pressure. The tricuspid regurgitant velocity is 2.19 m/s, and with an assumed right atrial pressure of 8 mmHg, the estimated right ventricular systolic pressure is 89.3 mmHg. Left Atrium: Left atrial size was normal in size. Right Atrium: Right atrial size was normal in size. Pericardium: Trivial pericardial effusion is present. The pericardial effusion is posterior to the left ventricle. Mitral Valve: The mitral valve is abnormal. There is mild thickening of the mitral valve leaflet(s). Trivial mitral valve regurgitation. Pulmonic Valve: The pulmonic valve was grossly normal. Pulmonic valve regurgitation is trivial. Aorta: Aortic dilatation noted. There is mild dilatation of the aortic root, measuring 39 mm. Venous: The inferior vena cava is normal in size with less than 50% respiratory variability, suggesting right atrial pressure of 8 mmHg. Additional Comments: There is a moderate pleural effusion in the left lateral region. Mild ascites is present. LEFT VENTRICLE PLAX 2D LVIDd:         5.10 cm LVIDs:         2.70 cm LV PW:         1.10 cm LV IVS:        1.00 cm LVOT diam:     2.20 cm LV SV:         67 LV SV Index:   32 LVOT Area:     3.80 cm  IVC IVC diam: 2.00 cm LEFT ATRIUM             Index LA diam:        3.90 cm 1.88 cm/m LA Vol (A2C):   49.8 ml 23.95 ml/m LA Vol (A4C):   39.7 ml 19.09 ml/m LA Biplane Vol: 44.9 ml 21.59 ml/m  AORTIC VALVE LVOT Vmax:   115.00 cm/s LVOT Vmean:  74.400 cm/s LVOT VTI:    0.175 m  AORTA Ao Root diam: 3.90 cm Ao Asc diam:  3.40 cm MITRAL VALVE                TRICUSPID VALVE MV Area (PHT): 4.35 cm     TR Peak grad:   19.2 mmHg MV Decel Time: 175 msec     TR Vmax:        219.00 cm/s MV E velocity: 112.00 cm/s MV A velocity: 130.00 cm/s  SHUNTS MV E/A ratio:  0.86         Systemic VTI:  0.18 m  Systemic Diam: 2.20 cm Lyman Bishop MD Electronically signed by Lyman Bishop MD Signature Date/Time: 08/26/2020/1:07:07 PM    Final       Laurey Arrow,  MD [facility]  If 7PM-7AM, please contact night-coverage [email or web address] 08/27/2020, 10:43 AM

## 2020-08-27 NOTE — Progress Notes (Signed)
eLink Physician-Brief Progress Note Patient Name: Eric Dennis DOB: [birth year] MRN: [id number]   Date of Service  08/27/2020  HPI/Events of Note  Notified of Mg 1.6 Creatinine 0.45  eICU Interventions  Electrolyte replacement protocol to be initiated     Intervention Category Intermediate Interventions: Electrolyte abnormality - evaluation and management  Judd Lien 08/27/2020, 4:13 AM

## 2020-08-27 NOTE — Evaluation (Signed)
Clinical/Bedside Swallow Evaluation Patient Details  Name: Eric Dennis MRN: [id number] Date of Birth: 1965/11/29  Today's Date: 08/27/2020 Time: SLP Start Time (ACUTE ONLY): 24 SLP Stop Time (ACUTE ONLY): 1406 SLP Time Calculation (min) (ACUTE ONLY): 11 min  Past Medical History:  Past Medical History:  Diagnosis Date   Elevated liver function tests    High cholesterol    Weight loss    Past Surgical History:  Past Surgical History:  Procedure Laterality Date   COLONOSCOPY N/A 03/14/2014   Procedure: COLONOSCOPY;  Surgeon: Rogene Houston, MD;  [facility]: [facility];  Service: Endoscopy;  Laterality: N/A;  730 - moved to 1/6 @ 12:00 - Ann notified pt   IR PARACENTESIS  07/19/2020   NO PAST SURGERIES     HPI:  55 yo with alcoholic cirrhosis, admitted with symptomatic severe anemia in setting of decompensated hepatic failure, coagulopathy. He had a tooth extracted 1 week ago and has been bleeding since. Admission Hgb 4.4, INR 3.1. He has had melena, treated with octreotide. Transfused PRBC, FFP and received Vit K. Some lethargy and evidence mild hepatic encephalopathy - treated with lactulose PO. No EDG has been done yet.        Course complicated by apparent aspiration event and aspiration PNA. His presentation CXR was normal but has evolving B R>L infiltrates on subsequent films. He has been treated with Unasyn. O2 needs have ranged between 6 - 15 L/min over the last few days, currently on 10L/min.   Assessment / Plan / Recommendation Clinical Impression  Pt presents with a mild oral and concern for component of pharyngeal dysphagia. Pt continues to have some bleeding in mouth from prior dental extraction and poor healing (per RN, bleeding much improved since admission). Missing dentition noted with remaining dentition in poor condition. Pt eager for PO, stating "ready for some real food". Following oral care pt with prolonged mastication of ice chips and solids with mild oral  residuals. Pt with intermittent coughing with thin liquids this date, appeared delayed, and most notable following straw use. Pt with hx of COPD. Given PNA this admission and episodic difficulty clinically this date recommend follow up objective swallow study. MBSS to be completed tomorrow. Pt and RN in agreement. Pt cleared by GI for diet advancement from clear liqiud diet. Recommend dysphagia 2 (finely chopped) and thin liquids by cup with meds in puree. Worry that increasingly more solid food may worsen oral bleeding. SLP to follow up.  SLP Visit Diagnosis: Dysphagia, oral phase (R13.11);Dysphagia, unspecified (R13.10)    Aspiration Risk  Mild aspiration risk;Moderate aspiration risk    Diet Recommendation   Dysphagia 2 (finely chopped) thin liquids   Medication Administration: Whole meds with puree    Other  Recommendations Oral Care Recommendations: Oral care BID   Follow up Recommendations Other (comment) (TBD)      Frequency and Duration min 2x/week  2 weeks       Prognosis Prognosis for Safe Diet Advancement: Fair Barriers to Reach Goals: Time post onset      Swallow Study   General Date of Onset: 08/22/20 HPI: 55 yo with alcoholic cirrhosis, admitted with symptomatic severe anemia in setting of decompensated hepatic failure, coagulopathy. He had a tooth extracted 1 week ago and has been bleeding since. Admission Hgb 4.4, INR 3.1. He has had melena, treated with octreotide. Transfused PRBC, FFP and received Vit K. Some lethargy and evidence mild hepatic encephalopathy - treated with lactulose PO. No EDG has been  done yet.        Course complicated by apparent aspiration event and aspiration PNA. His presentation CXR was normal but has evolving B R>L infiltrates on subsequent films. He has been treated with Unasyn. O2 needs have ranged between 6 - 15 L/min over the last few days, currently on 10L/min. Type of Study: Bedside Swallow Evaluation Previous Swallow Assessment: none on  file Diet Prior to this Study: Thin liquids Temperature Spikes Noted: No Respiratory Status: Nasal cannula History of Recent Intubation: No Behavior/Cognition: Alert;Cooperative Oral Cavity Assessment: Other (comment);Excessive secretions (bloody secretions continue from prior dental extraciton (left lower #19)) Oral Care Completed by SLP: Yes Oral Cavity - Dentition: Missing dentition;Poor condition Vision: Functional for self-feeding Self-Feeding Abilities: Able to feed self Patient Positioning: Upright in bed Baseline Vocal Quality: Low vocal intensity Volitional Cough: Congested;Weak Volitional Swallow: Able to elicit    Oral/Motor/Sensory Function Overall Oral Motor/Sensory Function: Generalized oral weakness   Ice Chips Ice chips: Impaired Presentation: Spoon Oral Phase Impairments: Reduced lingual movement/coordination Oral Phase Functional Implications: Prolonged oral transit Pharyngeal Phase Impairments: Suspected delayed Swallow;Multiple swallows   Thin Liquid Thin Liquid: Impaired Presentation: Cup;Straw Oral Phase Functional Implications: Prolonged oral transit Pharyngeal  Phase Impairments: Suspected delayed Swallow;Multiple swallows;Cough - Delayed    Nectar Thick Nectar Thick Liquid: Not tested   Honey Thick Honey Thick Liquid: Not tested   Puree Puree: Within functional limits   Solid     Solid: Impaired Presentation: Self Fed Oral Phase Impairments: Impaired mastication;Reduced lingual movement/coordination Oral Phase Functional Implications: Prolonged oral transit Pharyngeal Phase Impairments: Multiple swallows;Suspected delayed Swallow      Brayant Dorr H. MA, CCC-SLP Acute Rehabilitation Services   08/27/2020,2:30 PM

## 2020-08-27 NOTE — Progress Notes (Addendum)
   NAME:  Eric Dennis, MRN:  [id number], DOB:  Dec 05, 1965, LOS: 5 ADMISSION DATE:  08/22/2020, CONSULTATION DATE:  08/26/2020 REFERRING MD:  Dr. Cyndia Skeeters, CHIEF COMPLAINT: Persistent hypoxic respiratory failure  Brief Narrative  55 year old male with a past medical history significant for alcoholic cirrhosis with ongoing alcohol abuse who presented to the emergency department due to abnormal lab work seen in the outpatient setting.  Patient was seen with.  Patient recently had dental extraction with excessive bleeding secondary to coagulopathy.  Patient was initially admitted per hospitalist services for treatment of decompensated liver failure and acute blood loss anemia with supratherapeutic INR. Additionally patient was seen with acute hypoxic respiratory failure concerning for possible aspiration pneumonia.   PCCM pulmonary consulted 6/24 management of continued acute hypoxic respiratory failure.  Pertinent  Medical History  Alcoholic cirrhosis Alcohol abuse Hyperlipidemia  Significant Hospital Events: Including procedures, antibiotic start and stop dates in addition to other pertinent events   6/16 admitted for treatment of decompensated liver failure and acute blood loss anemia 6/20 PCCM consulted for persistent hypoxia  Interim History / Subjective:  No acute events overnight States he feels better Wife is at bedside and updated  Objective   Blood pressure (!) 106/59, pulse 81, temperature 97.7 F (36.5 C), temperature source Oral, resp. rate 19, height 6\' 1"  (1.854 m), weight 91.4 kg, SpO2 99 %.        Intake/Output Summary (Last 24 hours) at 08/27/2020 0704 Last data filed at 08/27/2020 5465 Gross per 24 hour  Intake 1649.15 ml  Output 1900 ml  Net -250.85 ml    Filed Weights   08/22/20 1018 08/26/20 0500 08/27/20 0500  Weight: 81.2 kg 83.7 kg 91.4 kg    Examination: General: Very deconditioned middle-aged male lying in bed and in no acute distress HEENT: [place]/AT, MM  pink/moist, PERRL, dark old blood seen in oral cavity Neuro: Slightly sleepy but improved from day prior, alert and oriented, able to follow commands CV: s1s2 regular rate and rhythm, no murmur, rubs, or gallops,  PULM: Bilateral rhonchi, weak ineffective cough, oxygen saturations 93% on 4 L nasal cannula, respiratory deconditioning GI: soft, bowel sounds active in all 4 quadrants, non-tender, distended Extremities: warm/dry, no edema  Skin: no rashes or lesions  Labs/imaging that I havepersonally reviewed   6/20 CXR Slight interval worsening of extensive bilateral airspace disease. Small left effusion and dense left lung base consolidation  Resolved Hospital Problem list     Assessment & Plan:  Acute hypoxic respiratory failure with bilateral infiltrates  -Respiratory status waxes and wanes it appears patient has required anywhere between 6 to 15 L high flow nasal cannula Aspiration pneumonitis -Likely secondary to excessive bleeding post dental extraction Decompensated alcoholic cirrhosis P: Continue IV Unasyn  Wean supplemental oxygen for sats greater than 90% Aggressive pulmonary hygiene this is imperative for pulmonary recovery Trial of chest PT Mobilize out of bed to chair Frequent IS and Flutter valve  Continue Lactulose  Trend INR Supplement thiamine, folate, and Vitamin B1  Best Practice  Per Primary     Critical care time:  Mickie Badders D. Kenton Kingfisher, NP-C [facility] Personal contact information can be found on Amion  08/27/2020, 7:04 AM

## 2020-08-27 NOTE — Progress Notes (Signed)
[facility] ADULT ICU REPLACEMENT PROTOCOL   The patient does apply for the [facility] Adult ICU Electrolyte Replacment Protocol based on the criteria listed below:   1. Is GFR >/= 30 ml/min? Yes.    Patient's GFR today is >60 2. Is SCr </= 2? Yes.   Patient's SCr is 0.45 ml/kg/hr 3. Did SCr increase >/= 0.5 in 24 hours? No. 4. Abnormal electrolyte(s): mag 1.6 5. Ordered repletion with: see standing orders 6. If a panic level lab has been reported, has the CCM MD in charge been notified? No..    Eric Dennis 08/27/2020 4:50 AM

## 2020-08-28 ENCOUNTER — Inpatient Hospital Stay (HOSPITAL_COMMUNITY): Payer: BC Managed Care – PPO

## 2020-08-28 LAB — COMPREHENSIVE METABOLIC PANEL
ALT: 29 U/L (ref 0–44)
AST: 64 U/L — ABNORMAL HIGH (ref 15–41)
Albumin: 2.2 g/dL — ABNORMAL LOW (ref 3.5–5.0)
Alkaline Phosphatase: 56 U/L (ref 38–126)
Anion gap: 9 (ref 5–15)
BUN: 18 mg/dL (ref 6–20)
CO2: 33 mmol/L — ABNORMAL HIGH (ref 22–32)
Calcium: 8.6 mg/dL — ABNORMAL LOW (ref 8.9–10.3)
Chloride: 90 mmol/L — ABNORMAL LOW (ref 98–111)
Creatinine, Ser: 0.42 mg/dL — ABNORMAL LOW (ref 0.61–1.24)
GFR, Estimated: >60 mL/min (ref >60)
Glucose, Bld: 127 mg/dL — ABNORMAL HIGH (ref 70–99)
Potassium: 4.1 mmol/L (ref 3.5–5.1)
Sodium: 132 mmol/L — ABNORMAL LOW (ref 135–145)
Total Bilirubin: 14.1 mg/dL — ABNORMAL HIGH (ref 0.3–1.2)
Total Protein: 6.3 g/dL — ABNORMAL LOW (ref 6.5–8.1)

## 2020-08-28 LAB — CBC
HCT: 32.5 % — ABNORMAL LOW (ref 39.0–52.0)
Hemoglobin: 11.1 g/dL — ABNORMAL LOW (ref 13.0–17.0)
MCH: 35.6 pg — ABNORMAL HIGH (ref 26.0–34.0)
MCHC: 34.2 g/dL (ref 30.0–36.0)
MCV: 104.2 fL — ABNORMAL HIGH (ref 80.0–100.0)
Platelets: 46 10*3/uL — ABNORMAL LOW (ref 150–400)
RBC: 3.12 MIL/uL — ABNORMAL LOW (ref 4.22–5.81)
WBC: 6.5 10*3/uL (ref 4.0–10.5)
nRBC: 0 % (ref 0.0–0.2)

## 2020-08-28 LAB — PROTIME-INR
INR: 2.9 — ABNORMAL HIGH (ref 0.8–1.2)
Prothrombin Time: 30.2 seconds — ABNORMAL HIGH (ref 11.4–15.2)

## 2020-08-28 LAB — PHOSPHORUS: Phosphorus: 2.6 mg/dL (ref 2.5–4.6)

## 2020-08-28 LAB — MAGNESIUM: Magnesium: 1.8 mg/dL (ref 1.7–2.4)

## 2020-08-28 NOTE — Hospital Course (Addendum)
55 year old man PMH alcoholic cirrhosis presented with low hemoglobin, melena, abdominal pain, recent dental extraction.  Admitted for decompensated cirrhosis, acute blood loss anemia with supratherapeutic INR.  Seen by gastroenterology, treated with octreotide and PPI.  Endoscopic evaluation deferred given coagulopathy which did not correct despite FFP and vitamin K.  Course complicated by ongoing bleeding from gums, seen by dental medicine 6/18 sutures placed.  Required multiple units PRBC.  Course complicated by aspiration event, thought to be blood, and aspiration pneumonia with acute hypoxic respiratory failure.  A & P  Acute blood loss anemia thought initially secondary to upper GI bleed, but now per GI thought to be related to bleeding from dental extraction site in the setting of coagulopathy secondary to alcoholic cirrhosis. Seen by oral surgery 6/18 for clot evacuation and suture of extraction site. Coagulopathy persistent despite vitamin K and FFP. -- Had some wheezing overnight per nursing and patient.  Hemoglobin did appropriately rise.  No bleeding seen on visual inspection today.  Monitor clinically.  Check CBC in AM.  Dental caries, status post recent dental extraction with ongoing intermittent bleeding in the setting of coagulopathy and thrombocytopenia. -- Follow-up with dental medicine as an outpatient.  Decompensated alcoholic cirrhosis with ascites, elevated LFTs, coagulopathy, hepatic encephalopathy, thrombocytopenia no suspicion for SBP at this time.  MELD-Na score 31 with 27 to 32% 90-day mortality. --Ongoing alcohol use at home.  Recommended cessation.  Wife agrees. -- Outpatient follow-up with GI  Acute hypoxic respiratory failure secondary to aspiration pneumonia, blood -- Improving, down to 2 L, asymptomatic.  Continue to wean as tolerated.  AKI --resolved  Dysphagia oropharyngeal, unspecified --Continue dysphagia 2 diet

## 2020-08-28 NOTE — Progress Notes (Addendum)
PROGRESS NOTE  Eric Dennis DJS:[id number] DOB: 1965-05-23 DOA: 08/22/2020 PCP: Scheryl Marten, PA  Brief History   Admitted46 year old man PMH alcoholic cirrhosis presenting with low hemoglobin, melena, abdominal pain, recent dental extraction.  For decompensated cirrhosis and acute blood loss anemia with supratherapeutic INR, aspiration pneumonia.  A & P  Acute blood loss anemia thought initially secondary to upper GI bleed, but now per GI thought to be related to bleeding from dental extraction site in the setting of coagulopathy secondary to alcoholic cirrhosis. -- Per nursing continues to have some intermittent bleeding from gums.  Seen by oral surgery 6/18 for clot evacuation and suture of extraction site. --Coagulopathy persistent despite vitamin K and FFP. --Hemoglobin stable status post PRBCs.  Follow clinically.  Repeat CBC in AM.  Dental caries, status post recent dental extraction with ongoing intermittent bleeding in the setting of coagulopathy and thrombocytopenia. --As above  Decompensated alcoholic cirrhosis with ascites, elevated LFTs, coagulopathy, thrombocytopenia no suspicion for SBP at this time.  MELD-Na score 31 with 27 to 32% 90-day mortality. --Ongoing alcohol use at home.  Recommend cessation. --Continue lactulose, vitamin supplementation. --Outpatient follow-up with gastroenterology. --We will discuss consideration for palliative care consultation tomorrow with wife  Acute hypoxic respiratory failure secondary to aspiration pneumonia --Wean oxygen as tolerated.  AKI --resolved  Dysphagia oropharyngeal, unspecified  Disposition Plan:  Discussion: Seen by GI yesterday, thought not to have GI bleed.  Octreotide and Protonix infusion was stopped.  Consider endoscopic evaluation in the future.  Wonder if may have some element of hepatic encephalopathy.  Continue lactulose, check labs and ammonia level in the morning.  Patient appears acutely and chronically  ill.  Prognosis in the long-term guarded.  Status is: Inpatient  Remains inpatient appropriate because:IV treatments appropriate due to intensity of illness or inability to take PO and Inpatient level of care appropriate due to severity of illness  Dispo: The patient is from: Home              Anticipated d/c is to: ?              Patient currently is not medically stable to d/c.   Difficult to place patient No  DVT prophylaxis: SCDs Start: 08/22/20 1218   Code Status: Full Code Level of care: Progressive Family Communication: wife at bedside  Murray Hodgkins, MD  [facility] Direct contact: see [email or web address] (further directions at bottom of note if needed) 7PM-7AM contact night coverage as at bottom of note 08/28/2020, 4:52 PM  LOS: 6 days   Significant Hospital Events      Consults:     Procedures:    Significant Diagnostic Tests:     Micro Data:     Antimicrobials:    Interval History/Subjective  CC: f/u bleeding  Feels ok, breathing ok, no complaints RN notes some bleeding from mouth earlier Eating well per wife at bedside  Review of Systems  Respiratory:  Negative for shortness of breath.   Cardiovascular:  Positive for leg swelling.  Gastrointestinal:  Negative for nausea and vomiting.   Objective   Vitals:  Vitals:   08/28/20 1225 08/28/20 1400  BP: (!) 100/57 (!) 105/59  Pulse: 87 89  Resp: 20   Temp: 98.4 F (36.9 C) 98.1 F (36.7 C)  SpO2: 94%     Exam: Physical Exam Vitals and nursing note reviewed.  Constitutional:      General: He is not in acute distress.    Appearance: He is ill-appearing.  Cardiovascular:     Rate and Rhythm: Normal rate and regular rhythm.     Heart sounds: No murmur heard.   No gallop.  Pulmonary:     Effort: Pulmonary effort is normal. No respiratory distress.     Breath sounds: Normal breath sounds. No wheezing or rales.  Abdominal:     General: There is distension.     Tenderness: There is no  abdominal tenderness.  Musculoskeletal:        General: Swelling (bilateral pedal) present.  Neurological:     Mental Status: He is alert.  Psychiatric:        Mood and Affect: Mood normal.        Behavior: Behavior normal.    I have personally reviewed the labs and other data, making special note of:   Today's Data  Hgb up to 11.1 Plts stable 46    Scheduled Meds:  carvedilol  6.25 mg Oral BID WC   Chlorhexidine Gluconate Cloth  6 each Topical Daily   folic acid  1 mg Intravenous Daily   furosemide  40 mg Intravenous BID   lactulose  15 g Oral BID   magnesium chloride  2 tablet Oral Daily   mouth rinse  15 mL Mouth Rinse BID   multivitamin  15 mL Oral Daily   nicotine  21 mg Transdermal Daily   pantoprazole (PROTONIX) IV  40 mg Intravenous Q12H   [START ON 09/05/2020] thiamine injection  100 mg Intravenous Daily   Continuous Infusions:  sodium chloride 10 mL/hr at 08/26/20 1800   ampicillin-sulbactam (UNASYN) IV 3 g (08/28/20 1408)   thiamine injection 500 mg (08/28/20 1135)   Followed by   Derrill Memo ON 08/31/2020] thiamine injection      Principal Problem:   Acute blood loss anemia Active Problems:   Thrombocytopenia (HCC)   Hypokalemia   Hyponatremia   AKI (acute kidney injury) ([place])   Alcoholic cirrhosis of liver with ascites ([place])   Acute hypoxemic respiratory failure ([place])   Aspiration pneumonia ([place])   LOS: 6 days   How to contact the [facility] Attending or Consulting provider [place] or covering provider during after hours [place], for this patient?  Check the care team in [facility] and look for a) attending/consulting [facility] provider listed and b) the [facility] team listed Log into [email or web address] and use [place]'s universal password to access. If you do not have the password, please contact the hospital operator. Locate the [facility] provider you are looking for under [facility] and page to a number that you can be directly reached. If you still have difficulty reaching the  provider, please page the [facility] (Director on Call) for the Hospitalists listed on amion for assistance.

## 2020-08-28 NOTE — Progress Notes (Signed)
PT Cancellation Note  Patient Details Name: Eric Dennis MRN: [id number] DOB: 18-Feb-1966   Cancelled Treatment:    Reason Eval/Treat Not Completed: Other (comment). Pt sleeping soundly. Did not arouse to voice. RN reports pt sleeping soundly since MBS earlier today. Allowed pt to continue his nap. Will continue efforts to complete PT eval   [facility] 08/28/2020, 3:31 PM

## 2020-08-28 NOTE — Progress Notes (Signed)
Objective Swallowing Evaluation: Type of Study: MBS-Modified Barium Swallow Study   Patient Details  Name: Eric Dennis MRN: [id number] Date of Birth: [birth year]  Today's Date: 08/28/2020 Time: SLP Start Time (ACUTE ONLY): 1245 -SLP Stop Time (ACUTE ONLY): 1315  SLP Time Calculation (min) (ACUTE ONLY): 30 min   Past Medical History:  Past Medical History:  Diagnosis Date   Elevated liver function tests    High cholesterol    Weight loss    Past Surgical History:  Past Surgical History:  Procedure Laterality Date   COLONOSCOPY N/A 03/14/2014   Procedure: COLONOSCOPY;  Surgeon: Rogene Houston, MD;  [facility]: [facility];  Service: Endoscopy;  Laterality: N/A;  730 - moved to 1/6 @ 12:00 - Ann notified pt   IR PARACENTESIS  07/19/2020   NO PAST SURGERIES     HPI: 55 yo with alcoholic cirrhosis, admitted with symptomatic severe anemia in setting of decompensated hepatic failure, coagulopathy. He had a tooth extracted 1 week ago and has been bleeding since. Admission Hgb 4.4, INR 3.1. He has had melena, treated with octreotide. Transfused PRBC, FFP and received Vit K. Some lethargy and evidence mild hepatic encephalopathy - treated with lactulose PO. No EDG has been done yet.        Course complicated by apparent aspiration event and aspiration PNA. His presentation CXR was normal but has evolving B R>L infiltrates on subsequent films. He has been treated with Unasyn. O2 needs have ranged between 6 - 15 L/min over the last few days, currently on 10L/min.   Subjective: alert, at bedside; cooperative    Assessment / Plan / Recommendation  CHL IP CLINICAL IMPRESSIONS 08/28/2020  Clinical Impression Pt presents with mild oropharyngeal and cervical esophageal dysphagia.  He demonstrates decreased oral transiting, coordination likely due to oral discomfort/bleeding resulting in lingual pumping and premature spillage with piecemealing of large boluses.  Pharyngeal swallow marked by minimal  decreased coordination of swallow and swallow triggering at pyriform sinus allowing minimal laryngeal penetration of thin x2 that did not clear larynx.  No aspiration noted and pt did cough during MBS x2.  Pharyngeal swallow was strong overall but decreased UES relaxation suspected *unable to view UES due to shoulder blocking view* due to backflow of barium to distal pharynx.  Pt does not sense backflow and thus strict aspiration precautions advised.  Due to degree of oral deficits at this time - did not test chin tuck posture as this would worsen oral delay.   Pt able to orally transit liquid with barium tablet.  Recommend continue dys2/thin diet at this time.  Pt tends to take large boluses especially of liquids and decreasing bolus size may help with airway protection.  Encouraged pt to keep cough and "hock" strong to clear secretions/po as neede and rest if short of breath or coughing.  Will follow briefly to assure po tolerance, readiness for dietary advancement - pt reports his swallow ability to be normal.  SLP Visit Diagnosis Dysphagia, oral phase (R13.11);Dysphagia, unspecified (R13.10)  Attention and concentration deficit following --  Frontal lobe and executive function deficit following --  Impact on safety and function Mild aspiration risk;Moderate aspiration risk      CHL IP TREATMENT RECOMMENDATION 08/28/2020  Treatment Recommendations Therapy as outlined in treatment plan below     Prognosis 08/28/2020  Prognosis for Safe Diet Advancement Guarded  Barriers to Reach Goals Time post onset  Barriers/Prognosis Comment --    CHL IP DIET RECOMMENDATION 08/28/2020  SLP  Diet Recommendations Dysphagia 3 (Mech soft) solids;Thin liquid  Liquid Administration via Cup;Straw  Medication Administration Whole meds with liquid  Compensations Slow rate;Small sips/bites  Postural Changes Remain semi-upright after after feeds/meals (Comment);Seated upright at 90 degrees      CHL IP OTHER  RECOMMENDATIONS 08/28/2020  Recommended Consults --  Oral Care Recommendations Oral care BID  Other Recommendations --      CHL IP FOLLOW UP RECOMMENDATIONS 08/27/2020  Follow up Recommendations Other (comment)      CHL IP FREQUENCY AND DURATION 08/28/2020  Speech Therapy Frequency (ACUTE ONLY) min 2x/week  Treatment Duration 2 weeks           CHL IP ORAL PHASE 08/28/2020  Oral Phase Impaired  Oral - Pudding Teaspoon --  Oral - Pudding Cup --  Oral - Honey Teaspoon --  Oral - Honey Cup --  Oral - Nectar Teaspoon --  Oral - Nectar Cup Delayed oral transit;Weak lingual manipulation;Decreased bolus cohesion;Reduced posterior propulsion;Lingual/palatal residue;Piecemeal swallowing;Premature spillage  Oral - Nectar Straw Delayed oral transit;Weak lingual manipulation;Decreased bolus cohesion;Reduced posterior propulsion;Piecemeal swallowing;Premature spillage  Oral - Thin Teaspoon Delayed oral transit;Weak lingual manipulation;Decreased bolus cohesion;Reduced posterior propulsion;Piecemeal swallowing;Premature spillage  Oral - Thin Cup Delayed oral transit;Weak lingual manipulation;Decreased bolus cohesion;Reduced posterior propulsion;Piecemeal swallowing;Premature spillage  Oral - Thin Straw Delayed oral transit;Weak lingual manipulation;Reduced posterior propulsion;Piecemeal swallowing;Premature spillage;Decreased bolus cohesion  Oral - Puree Delayed oral transit;Weak lingual manipulation;Decreased bolus cohesion;Reduced posterior propulsion;Piecemeal swallowing;Premature spillage  Oral - Mech Soft Delayed oral transit;Weak lingual manipulation;Decreased bolus cohesion;Reduced posterior propulsion;Piecemeal swallowing;Premature spillage  Oral - Regular --  Oral - Multi-Consistency --  Oral - Pill Delayed oral transit;Weak lingual manipulation;Decreased bolus cohesion;Reduced posterior propulsion;Piecemeal swallowing;Premature spillage  Oral Phase - Comment --    CHL IP PHARYNGEAL PHASE  08/28/2020  Pharyngeal Phase Impaired  Pharyngeal- Pudding Teaspoon --  Pharyngeal --  Pharyngeal- Pudding Cup --  Pharyngeal --  Pharyngeal- Honey Teaspoon --  Pharyngeal --  Pharyngeal- Honey Cup --  Pharyngeal --  Pharyngeal- Nectar Teaspoon WFL;Pharyngeal residue - cp segment  Pharyngeal --  Pharyngeal- Nectar Cup [facility];Pharyngeal residue - cp segment  Pharyngeal Material does not enter airway  Pharyngeal- Nectar Straw WFL;Pharyngeal residue - cp segment  Pharyngeal Material does not enter airway  Pharyngeal- Thin Teaspoon Pharyngeal residue - pyriform;Delayed swallow initiation-pyriform sinuses;Pharyngeal residue - cp segment  Pharyngeal Material does not enter airway  Pharyngeal- Thin Cup Delayed swallow initiation-pyriform sinuses;Penetration/Aspiration during swallow;Pharyngeal residue - cp segment  Pharyngeal Material enters airway, remains ABOVE vocal cords and not ejected out  Pharyngeal- Thin Straw Delayed swallow initiation-pyriform sinuses;Penetration/Aspiration during swallow;Pharyngeal residue - cp segment  Pharyngeal Material enters airway, remains ABOVE vocal cords and not ejected out  Pharyngeal- Puree WFL;Pharyngeal residue - cp segment  Pharyngeal Material does not enter airway  Pharyngeal- Mechanical Soft WFL;Pharyngeal residue - cp segment  Pharyngeal Material does not enter airway  Pharyngeal- Regular --  Pharyngeal --  Pharyngeal- Multi-consistency WFL;Penetration/Aspiration during swallow;Pharyngeal residue - cp segment  Pharyngeal Material enters airway, remains ABOVE vocal cords and not ejected out  Pharyngeal- Pill --  Pharyngeal --  Pharyngeal Comment --     CHL IP CERVICAL ESOPHAGEAL PHASE 08/28/2020  Cervical Esophageal Phase Impaired  Pudding Teaspoon --  Pudding Cup --  Honey Teaspoon --  Honey Cup --  Nectar Teaspoon --  Nectar Cup --  Nectar Straw --  Thin Teaspoon --  Thin Cup --  Thin Straw --  Puree --  Mechanical Soft --  Regular --  Multi-consistency --  Pill --  Cervical Esophageal Comment Decreased UES opening/relaxation resulting in backflow of barium into distal pharynx - not to the level of the larynx   Kathleen Lime, MS [facility] SLP Acute Rehab Services Office [phone] Pager [phone]   Macario Golds 08/28/2020, 2:20 PM

## 2020-08-28 NOTE — Progress Notes (Signed)
MBSS on schedule for today at noon.  Full report to follow.   Kathleen Lime, MS [facility] SLP Acute Rehab Services Office [phone] Pager [phone]

## 2020-08-28 NOTE — Plan of Care (Signed)
  Problem: Clinical Measurements: Goal: Will remain free from infection Outcome: Progressing Goal: Respiratory complications will improve Outcome: Progressing Goal: Cardiovascular complication will be avoided Outcome: Progressing   

## 2020-08-29 LAB — COMPREHENSIVE METABOLIC PANEL
ALT: 27 U/L (ref 0–44)
AST: 62 U/L — ABNORMAL HIGH (ref 15–41)
Albumin: 2 g/dL — ABNORMAL LOW (ref 3.5–5.0)
Alkaline Phosphatase: 58 U/L (ref 38–126)
Anion gap: 6 (ref 5–15)
BUN: 21 mg/dL — ABNORMAL HIGH (ref 6–20)
CO2: 36 mmol/L — ABNORMAL HIGH (ref 22–32)
Calcium: 8.4 mg/dL — ABNORMAL LOW (ref 8.9–10.3)
Chloride: 93 mmol/L — ABNORMAL LOW (ref 98–111)
Creatinine, Ser: 0.55 mg/dL — ABNORMAL LOW (ref 0.61–1.24)
GFR, Estimated: >60 mL/min (ref >60)
Glucose, Bld: 103 mg/dL — ABNORMAL HIGH (ref 70–99)
Potassium: 3.4 mmol/L — ABNORMAL LOW (ref 3.5–5.1)
Sodium: 135 mmol/L (ref 135–145)
Total Bilirubin: 10.1 mg/dL — ABNORMAL HIGH (ref 0.3–1.2)
Total Protein: 5.8 g/dL — ABNORMAL LOW (ref 6.5–8.1)

## 2020-08-29 LAB — CBC
HCT: 20.3 % — ABNORMAL LOW (ref 39.0–52.0)
HCT: 18.5 % — ABNORMAL LOW (ref 39.0–52.0)
Hemoglobin: 6.9 g/dL — CL (ref 13.0–17.0)
Hemoglobin: 6.6 g/dL — CL (ref 13.0–17.0)
MCH: 36.9 pg — ABNORMAL HIGH (ref 26.0–34.0)
MCH: 38.6 pg — ABNORMAL HIGH (ref 26.0–34.0)
MCHC: 34 g/dL (ref 30.0–36.0)
MCHC: 35.7 g/dL (ref 30.0–36.0)
MCV: 108.6 fL — ABNORMAL HIGH (ref 80.0–100.0)
MCV: 108.2 fL — ABNORMAL HIGH (ref 80.0–100.0)
Platelets: 76 10*3/uL — ABNORMAL LOW (ref 150–400)
Platelets: 72 10*3/uL — ABNORMAL LOW (ref 150–400)
RBC: 1.87 MIL/uL — ABNORMAL LOW (ref 4.22–5.81)
RBC: 1.71 MIL/uL — ABNORMAL LOW (ref 4.22–5.81)
WBC: 7.6 10*3/uL (ref 4.0–10.5)
WBC: 7.4 10*3/uL (ref 4.0–10.5)
nRBC: 0 % (ref 0.0–0.2)
nRBC: 0 % (ref 0.0–0.2)

## 2020-08-29 LAB — HEMOGLOBIN AND HEMATOCRIT, BLOOD
HCT: 20.8 % — ABNORMAL LOW (ref 39.0–52.0)
Hemoglobin: 7 g/dL — ABNORMAL LOW (ref 13.0–17.0)

## 2020-08-29 LAB — AMMONIA: Ammonia: 29 umol/L (ref 9–35)

## 2020-08-29 LAB — PREPARE RBC (CROSSMATCH)

## 2020-08-29 MED ORDER — SODIUM CHLORIDE 0.9% IV SOLUTION: Freq: Once | INTRAVENOUS | Status: AC

## 2020-08-29 MED ORDER — SODIUM CHLORIDE 0.9 % IN NEBU
3.0000 mL | INHALATION_SOLUTION | Freq: Three times a day (TID) | RESPIRATORY_TRACT | Status: DC
  Administered 2020-08-30 – 2020-08-31 (×4): 3 mL via RESPIRATORY_TRACT
  Filled 2020-08-29 (×7): qty 3

## 2020-08-29 MED ORDER — POTASSIUM CHLORIDE CRYS ER 20 MEQ PO TBCR
40.0000 meq | EXTENDED_RELEASE_TABLET | Freq: Once | ORAL | Status: AC
  Administered 2020-08-29: 40 meq via ORAL
  Filled 2020-08-29: qty 2

## 2020-08-29 NOTE — Plan of Care (Signed)
  Problem: Clinical Measurements: Goal: Ability to maintain clinical measurements within normal limits will improve Outcome: Progressing Goal: Diagnostic test results will improve Outcome: Progressing Goal: Respiratory complications will improve Outcome: Progressing   Problem: Activity: Goal: Risk for activity intolerance will decrease Outcome: Progressing   Problem: Nutrition: Goal: Adequate nutrition will be maintained Outcome: Progressing

## 2020-08-29 NOTE — Progress Notes (Signed)
PT Cancellation Note  Patient Details Name: Eric Dennis MRN: [id number] DOB: 1965/05/30   Cancelled Treatment:    Reason Eval/Treat Not Completed: Patient not medically ready. Pt's hemoglobin subtherapeutic, receiving unit of blood per chart review. Will check back for PT eval as appropriate.   Talbot Grumbling PT, DPT 08/29/20, 9:29 AM

## 2020-08-29 NOTE — Progress Notes (Signed)
PROGRESS NOTE  Eric Dennis QAS:[id number] DOB: Sep 25, 1965 DOA: 08/22/2020 PCP: Scheryl Marten, PA  Brief History   Admitted47 year old man PMH alcoholic cirrhosis presenting with low hemoglobin, melena, abdominal pain, recent dental extraction.  For decompensated cirrhosis and acute blood loss anemia with supratherapeutic INR, aspiration pneumonia.  A & P  Acute blood loss anemia thought initially secondary to upper GI bleed, but now per GI thought to be related to bleeding from dental extraction site in the setting of coagulopathy secondary to alcoholic cirrhosis. Seen by oral surgery 6/18 for clot evacuation and suture of extraction site. Coagulopathy persistent despite vitamin K and FFP. -- No bleeding per patient or nursing.  Hemoglobin down but I suspect the value of 11.1 yesterday was spurious, last transfused 6/18 with hemoglobin in the sevens 6/20. --Suspect slow bleed which probably has stopped at this point.  Check CBC in AM.  Transfuse 1 unit PRBC now.  Dental caries, status post recent dental extraction with ongoing intermittent bleeding in the setting of coagulopathy and thrombocytopenia. -- Follow-up with dental medicine as an outpatient.  Decompensated alcoholic cirrhosis with ascites, elevated LFTs, coagulopathy, thrombocytopenia no suspicion for SBP at this time.  MELD-Na score 31 with 27 to 32% 90-day mortality. --Ongoing alcohol use at home.  Recommend cessation.  Wife agrees. -- Ammonia within normal limits.  Mentation appears normal.  Well continue lactulose, vitamin supplementation. --Outpatient follow-up with gastroenterology.  Acute hypoxic respiratory failure secondary to aspiration pneumonia --Wean oxygen as tolerated.  Appears to be doing well clinically.  If unable to wean oxygen, repeat imaging tomorrow.  AKI --resolved  Dysphagia oropharyngeal, unspecified --Continue dysphagia 2 diet  Disposition Plan:  Discussion: Overall appears somewhat better  today.  Monitor hemoglobin given need for multiple transfusions and to ensure no more bleeding.  Wean oxygen as tolerated.  Hopeful discharge in 48 hours.  Status is: Inpatient  Remains inpatient appropriate because:IV treatments appropriate due to intensity of illness or inability to take PO and Inpatient level of care appropriate due to severity of illness  Dispo: The patient is from: Home              Anticipated d/c is to: Home, PT evaluation pending              Patient currently is not medically stable to d/c.   Difficult to place patient No  DVT prophylaxis: SCDs Start: 08/22/20 1218   Code Status: Full Code Level of care: Progressive Family Communication: wife at bedside  Murray Hodgkins, MD  [facility] Direct contact: see [email or web address] (further directions at bottom of note if needed) 7PM-7AM contact night coverage as at bottom of note 08/29/2020, 9:22 AM  LOS: 7 days   Significant Hospital Events      Consults:     Procedures:    Significant Diagnostic Tests:     Micro Data:     Antimicrobials:    Interval History/Subjective  CC: f/u bleeding  No bleeding overnight per nursing.  Feels well today.  Breathing fine today. Patient denies pain.  Review of Systems  Respiratory:  Negative for shortness of breath.   Cardiovascular:  Positive for leg swelling (Chronic).   Objective   Vitals:  Vitals:   08/28/20 2107 08/29/20 0613  BP: 102/61 112/63  Pulse: 85 91  Resp: 20   Temp: 97.9 F (36.6 C) 98.1 F (36.7 C)  SpO2: 100% 90%    Exam: Physical Exam Vitals and nursing note reviewed.  Constitutional:  General: He is not in acute distress.    Appearance: He is not ill-appearing or toxic-appearing.  HENT:     Nose:     Comments: Suture in place on left mouth.  No bleeding noted.    Mouth/Throat:     Mouth: Mucous membranes are moist.     Pharynx: No oropharyngeal exudate.  Cardiovascular:     Rate and Rhythm: Normal rate and regular  rhythm.     Heart sounds: No murmur heard.   No gallop.  Pulmonary:     Effort: Pulmonary effort is normal. No respiratory distress.     Breath sounds: Normal breath sounds. No wheezing or rales.  Abdominal:     General: There is distension.     Tenderness: There is no abdominal tenderness.  Musculoskeletal:        General: Swelling (bilateral pedal unchanged.) present.  Neurological:     Mental Status: He is alert.  Psychiatric:        Mood and Affect: Mood normal.        Behavior: Behavior normal.     Comments: Quite awake and alert.    I have personally reviewed the labs and other data, making special note of:   Today's Data  Confirmed hemoglobin 6.6.  In reviewing the data, I think the hemoglobin of 11.1 on 6/22 was spurious, last had blood 6/18 and hemoglobin was in the sevens 6/20.   Scheduled Meds:  sodium chloride   Intravenous Once   carvedilol  6.25 mg Oral BID WC   Chlorhexidine Gluconate Cloth  6 each Topical Daily   folic acid  1 mg Intravenous Daily   furosemide  40 mg Intravenous BID   lactulose  15 g Oral BID   magnesium chloride  2 tablet Oral Daily   mouth rinse  15 mL Mouth Rinse BID   multivitamin  15 mL Oral Daily   nicotine  21 mg Transdermal Daily   pantoprazole (PROTONIX) IV  40 mg Intravenous Q12H   potassium chloride  40 mEq Oral Once   [START ON 09/05/2020] thiamine injection  100 mg Intravenous Daily   Continuous Infusions:  sodium chloride 10 mL/hr at 08/26/20 1800   ampicillin-sulbactam (UNASYN) IV 3 g (08/29/20 1031)   thiamine injection 500 mg (08/28/20 1135)   Followed by   Derrill Memo ON 08/31/2020] thiamine injection      Principal Problem:   Acute blood loss anemia Active Problems:   Thrombocytopenia (HCC)   Hypokalemia   Hyponatremia   AKI (acute kidney injury) ([place])   Alcoholic cirrhosis of liver with ascites ([place])   Acute hypoxemic respiratory failure ([place])   Aspiration pneumonia ([facility])   LOS: 7 days   How to contact the [facility]  Attending or Consulting provider [place] or covering provider during after hours [place], for this patient?  Check the care team in [facility] and look for a) attending/consulting TRH provider listed and b) the [facility] team listed Log into [email or web address] and use [place]'s universal password to access. If you do not have the password, please contact the hospital operator. Locate the [facility] provider you are looking for under [facility] and page to a number that you can be directly reached. If you still have difficulty reaching the provider, please page the [facility] (Director on Call) for the Hospitalists listed on amion for assistance.

## 2020-08-30 LAB — BPAM RBC
Blood Product Expiration Date: 202207112359
ISSUE DATE / TIME: 202206231441
Unit Type and Rh: 6200

## 2020-08-30 LAB — BASIC METABOLIC PANEL
Anion gap: 7 (ref 5–15)
BUN: 17 mg/dL (ref 6–20)
CO2: 34 mmol/L — ABNORMAL HIGH (ref 22–32)
Calcium: 8.5 mg/dL — ABNORMAL LOW (ref 8.9–10.3)
Chloride: 94 mmol/L — ABNORMAL LOW (ref 98–111)
Creatinine, Ser: 0.59 mg/dL — ABNORMAL LOW (ref 0.61–1.24)
GFR, Estimated: >60 mL/min (ref >60)
Glucose, Bld: 107 mg/dL — ABNORMAL HIGH (ref 70–99)
Potassium: 3.5 mmol/L (ref 3.5–5.1)
Sodium: 135 mmol/L (ref 135–145)

## 2020-08-30 LAB — CBC
HCT: 21.9 % — ABNORMAL LOW (ref 39.0–52.0)
Hemoglobin: 7.4 g/dL — ABNORMAL LOW (ref 13.0–17.0)
MCH: 36.5 pg — ABNORMAL HIGH (ref 26.0–34.0)
MCHC: 33.8 g/dL (ref 30.0–36.0)
MCV: 107.9 fL — ABNORMAL HIGH (ref 80.0–100.0)
Platelets: 64 10*3/uL — ABNORMAL LOW (ref 150–400)
RBC: 2.03 MIL/uL — ABNORMAL LOW (ref 4.22–5.81)
WBC: 7.9 10*3/uL (ref 4.0–10.5)
nRBC: 0.4 % — ABNORMAL HIGH (ref 0.0–0.2)

## 2020-08-30 LAB — TYPE AND SCREEN
ABO/RH(D): A POS
Antibody Screen: NEGATIVE
Unit division: 0

## 2020-08-30 MED ORDER — FUROSEMIDE 40 MG PO TABS
40.0000 mg | ORAL_TABLET | Freq: Every day | ORAL | Status: DC
  Administered 2020-08-31: 40 mg via ORAL
  Filled 2020-08-30: qty 1

## 2020-08-30 NOTE — Plan of Care (Signed)
  Problem: Education: Goal: Knowledge of General Education information will improve Description Including pain rating scale, medication(s)/side effects and non-pharmacologic comfort measures Outcome: Progressing   Problem: Clinical Measurements: Goal: Diagnostic test results will improve Outcome: Progressing Goal: Respiratory complications will improve Outcome: Progressing   

## 2020-08-30 NOTE — TOC Progression Note (Addendum)
Transition of Care [facility]) - Progression Note    Patient Details  Name: Eric Dennis MRN: [id number] Date of Birth: 1965-04-11  [facility]) CM/SW Contact  Vieva Brummitt, Juliann Pulse, RN Phone Number: 08/30/2020, 11:58 AM  Clinical Narrative: Spoke to spouse about d/c plans-patient indep w/adl's,& ambulation-worked PTA. On 02-will monitor. PT ordered-await recc.  2:50p-PT-no f/u. Noted 02 sats-[employer] rep Zach aware & following-await home 02 order if needed @ d/c.     Expected Discharge Plan: Home/Self Care Barriers to Discharge: Continued Medical Work up  Expected Discharge Plan and Services Expected Discharge Plan: Home/Self Care   Discharge Planning Services: CM Consult   Living arrangements for the past 2 months: Single Family Home                                       Social Determinants of Health (SDOH) Interventions    Readmission Risk Interventions No flowsheet data found.

## 2020-08-30 NOTE — Plan of Care (Signed)
Plan of care reviewed and discussed with the patient. 

## 2020-08-30 NOTE — Progress Notes (Addendum)
Appropriate posttransfusion rise in hemoglobin noted.  This patient already has a scheduled follow-up appointment with his primary gastroenterologist, Dr. Ronnette Juniper, for June 30 at 10:45 AM.  Patient's wife was at the bedside, and I had a lengthy conversation with her and with the patient, primarily about his hepatic prognosis.  I went through his various labs, most of which show significant impairment of hepatic function, and a current meld score that is calculated to show a fairly high risk of mortality in the next 90 days.  On the other hand, he just stopped drinking alcohol about 2 weeks ago, so it is unclear if his current liver status is amenable to recovery.  We also talked about timing of endoscopic evaluation as well as possible colonoscopy for screening.  Recommendation:  1.  If hemoglobin tomorrow is fairly stable, I would be comfortable with discharge, and early follow-up as already scheduled next week with his primary gastroenterologist.  2.  If the patient's hemoglobin tomorrow has shown a decline, then we should consider doing endoscopy and colonoscopy as an inpatient.  3.  Dr. Michail Sermon is making rounds for [place] tomorrow and can assess the patient's status at that time.  Time spent with the patient and his wife, and on record review and documentation, was approximately 30 minutes  Cleotis Nipper, M.D. Pager [phone] If no answer or after 5 PM call [phone]

## 2020-08-30 NOTE — Progress Notes (Signed)
PROGRESS NOTE  Eric Dennis VWU:[id number] DOB: October 19, 1965 DOA: 08/22/2020 PCP: Scheryl Marten, PA  Brief History   55 year old man PMH alcoholic cirrhosis presented with low hemoglobin, melena, abdominal pain, recent dental extraction.  Admitted for decompensated cirrhosis, acute blood loss anemia with supratherapeutic INR.  Seen by gastroenterology, treated with octreotide and PPI.  Endoscopic evaluation deferred given coagulopathy which did not correct despite FFP and vitamin K.  Course complicated by ongoing bleeding from gums, seen by dental medicine 6/18 sutures placed.  Required multiple units PRBC.  Course complicated by aspiration event, thought to be blood, and aspiration pneumonia with acute hypoxic respiratory failure.  A & P  Acute blood loss anemia thought initially secondary to upper GI bleed, but now per GI thought to be related to bleeding from dental extraction site in the setting of coagulopathy secondary to alcoholic cirrhosis. Seen by oral surgery 6/18 for clot evacuation and suture of extraction site. Coagulopathy persistent despite vitamin K and FFP. -- Had some wheezing overnight per nursing and patient.  Hemoglobin did appropriately rise.  No bleeding seen on visual inspection today.  Monitor clinically.  Check CBC in AM.  Dental caries, status post recent dental extraction with ongoing intermittent bleeding in the setting of coagulopathy and thrombocytopenia. -- Follow-up with dental medicine as an outpatient.  Decompensated alcoholic cirrhosis with ascites, elevated LFTs, coagulopathy, hepatic encephalopathy, thrombocytopenia no suspicion for SBP at this time.  MELD-Na score 31 with 27 to 32% 90-day mortality. --Ongoing alcohol use at home.  Recommended cessation.  Wife agrees. -- Outpatient follow-up with GI  Acute hypoxic respiratory failure secondary to aspiration pneumonia, blood -- Improving, down to 2 L, asymptomatic.  Continue to wean as  tolerated.  AKI --resolved  Dysphagia oropharyngeal, unspecified --Continue dysphagia 2 diet  Disposition Plan:  Discussion: Appears to be improving, oxygen being weaned.  Still having some occasional wheezing from above. Visibly present now.  Will continue to trend hemoglobin.  Status is: Inpatient  Remains inpatient appropriate because:IV treatments appropriate due to intensity of illness or inability to take PO and Inpatient level of care appropriate due to severity of illness  Dispo: The patient is from: Home              Anticipated d/c is to: Home, PT evaluation pending              Patient currently is not medically stable to d/c.   Difficult to place patient No  DVT prophylaxis: SCDs Start: 08/22/20 1218   Code Status: Full Code Level of care: Progressive Family Communication: wife at bedside 6/24  Murray Hodgkins, MD  [facility] Direct contact: see [email or web address] (further directions at bottom of note if needed) 7PM-7AM contact night coverage as at bottom of note 08/30/2020, 3:00 PM  LOS: 8 days    Consults:  Dental medicine GI   Interval History/Subjective  CC: f/u bleeding  Oozing from mouth per patient and her nurse.  No evidence of issues.  [place] 2 L on oxygen.  Feeling fine.  Review of Systems  Respiratory:  Negative for shortness of breath.   Cardiovascular:  Positive for leg swelling (Chronic).   Objective   Vitals:  Vitals:   08/30/20 0934 08/30/20 1313  BP: (!) 123/59 (!) 97/54  Pulse: 87 84  Resp:  20  Temp:  98.1 F (36.7 C)  SpO2:  99%    Exam: Physical Exam Vitals and nursing note reviewed.  Constitutional:      General: He  is not in acute distress.    Appearance: He is not ill-appearing or toxic-appearing.  HENT:     Nose:     Comments: Suture in place on left mouth.  No bleeding noted.    Mouth/Throat:     Mouth: Mucous membranes are moist.     Pharynx: No oropharyngeal exudate.  Cardiovascular:     Rate and Rhythm:  Normal rate and regular rhythm.     Heart sounds: No murmur heard.   No gallop.  Pulmonary:     Effort: Pulmonary effort is normal. No respiratory distress.     Breath sounds: Normal breath sounds. No wheezing or rales.  Abdominal:     General: There is distension.     Tenderness: There is no abdominal tenderness.  Musculoskeletal:        General: Swelling (bilateral pedal unchanged.) present.  Neurological:     Mental Status: He is alert.  Psychiatric:        Mood and Affect: Mood normal.        Behavior: Behavior normal.     Comments: Quite awake and alert.    I have personally reviewed the labs and other data, making special note of:   Today's Data  BMP unremarkable Hemoglobin appropriately increased to 7.4 status post transfusion.  Platelets stable at 64. BMP unremarkable  Scheduled Meds:  carvedilol  6.25 mg Oral BID WC   Chlorhexidine Gluconate Cloth  6 each Topical Daily   folic acid  1 mg Intravenous Daily   [START ON 08/31/2020] furosemide  40 mg Oral Daily   lactulose  15 g Oral BID   magnesium chloride  2 tablet Oral Daily   mouth rinse  15 mL Mouth Rinse BID   multivitamin  15 mL Oral Daily   nicotine  21 mg Transdermal Daily   pantoprazole (PROTONIX) IV  40 mg Intravenous Q12H   sodium chloride  3 mL Nebulization TID   [START ON 09/05/2020] thiamine injection  100 mg Intravenous Daily   Continuous Infusions:  sodium chloride 10 mL/hr at 08/26/20 1800   [START ON 08/31/2020] thiamine injection      Principal Problem:   Acute blood loss anemia Active Problems:   Thrombocytopenia (HCC)   Hypokalemia   Hyponatremia   AKI (acute kidney injury) ([place])   Alcoholic cirrhosis of liver with ascites ([place])   Acute hypoxemic respiratory failure ([place])   Aspiration pneumonia ([place])   LOS: 8 days   How to contact the [facility] Attending or Consulting provider [place] or covering provider during after hours [place], for this patient?  Check the care team in [facility] and look for  a) attending/consulting TRH provider listed and b) the [facility] team listed Log into [email or web address] and use [place] universal password to access. If you do not have the password, please contact the hospital operator. Locate the [facility] provider you are looking for under [facility] and page to a number that you can be directly reached. If you still have difficulty reaching the provider, please page the [facility] (Director on Call) for the Hospitalists listed on amion for assistance.

## 2020-08-30 NOTE — Progress Notes (Signed)
  Speech Language Pathology Treatment: Dysphagia  Patient Details Name: Eric Dennis MRN: [id number] DOB: 16-Oct-1965 Today's Date: 08/30/2020 Time: 1330-1340 SLP Time Calculation (min) (ACUTE ONLY): 10.08 min  Assessment / Plan / Recommendation Clinical Impression  Pt alert, communicative, sitting in recliner. His wife is present.  He is demonstrating much improved swallow with adequate mastication of regular solids (no further bleeding from mouth), the appearance of a brisk swallow response, and no s/s of aspiration after sequential sips of thin liquid and mixed solid/liquid consistencies.  Dysphagia appears more likely to have been transient.  Recommend advancing diet to regular solids, thin liquids, meds in liquid. No further SLP f/u is needed. D/W wife and pt. Our service will sign off.   HPI HPI: 55 yo with alcoholic cirrhosis, admitted with symptomatic severe anemia in setting of decompensated hepatic failure, coagulopathy. He had a tooth extracted 1 week ago and has been bleeding since. Admission Hgb 4.4, INR 3.1. He has had melena, treated with octreotide. Transfused PRBC, FFP and received Vit K. Some lethargy and evidence mild hepatic encephalopathy - treated with lactulose PO. No EDG has been done yet.        Course complicated by apparent aspiration event and aspiration PNA. His presentation CXR was normal but has evolving B R>L infiltrates on subsequent films. He has been treated with Unasyn. O2 needs have ranged between 6 - 15 L/min over the last few days, currently on 10L/min.      SLP Plan  All goals met       Recommendations  Diet recommendations: Regular;Thin liquid Liquids provided via: Cup;Straw Medication Administration: Whole meds with liquids Supervision: Patient able to self feed                Oral Care Recommendations: Oral care BID SLP Visit Diagnosis: Dysphagia, oral phase (R13.11) Plan: All goals met       GO               Eric Dennis, [place]  CCC/SLP Acute Rehabilitation Services Office number [phone] Pager [phone]  Eric Dennis 08/30/2020, 1:41 PM

## 2020-08-30 NOTE — Evaluation (Signed)
Physical Therapy Evaluation Patient Details Name: Eric Dennis MRN: [id number] DOB: 01/01/1966 Today's Date: 08/30/2020   History of Present Illness  Pt is a 55 year old man admitted 08/22/20 presenting with low hemoglobin, melena, abdominal pain, recent dental extraction.  PMH alcoholic cirrhosis elevated LFTs.  Clinical Impression  Pt admitted with above diagnosis. Pt currently with functional limitations due to the deficits listed below (see PT Problem List). Pt will benefit from skilled PT to increase their independence and safety with mobility to allow discharge to the venue listed below.  Pt presents with generalized weakness however agreeable to mobilize.  Pt requiring supplemental oxygen at rest so provided 2L O2 [place] for activity.  Pt plans to return home and agreeable to use cane upon d/c (declined RW).  SATURATION QUALIFICATIONS: (This note is used to comply with regulatory documentation for home oxygen)  Patient Saturations on Room Air at Rest = 85%  Patient Saturations on Room Air while Ambulating = n/a  Patient Saturations on 2 Liters of oxygen while Ambulating = 98%  Please briefly explain why patient needs home oxygen: to improve oxygen saturations at rest and during exertional tasks such as ambulation.     Follow Up Recommendations No PT follow up    Equipment Recommendations  Cane    Recommendations for Other Services       Precautions / Restrictions Precautions Precautions: Fall Precaution Comments: monitor sats      Mobility  Bed Mobility Overal bed mobility: Needs Assistance Bed Mobility: Supine to Sit     Supine to sit: Supervision          Transfers Overall transfer level: Needs assistance Equipment used: None Transfers: Sit to/from Stand Sit to Stand: Min guard         General transfer comment: min/guard for safety  Ambulation/Gait Ambulation/Gait assistance: Min assist;Min guard Gait Distance (Feet): 40 Feet   Gait  Pattern/deviations: Step-through pattern;Decreased stride length Gait velocity: decr   General Gait Details: slow pace, pt utilized hand rail for support, min assist for weakness when not having UE support; pt agreeable to use cane upon d/c (not RW); required supplemental oxygen for mobility  Stairs            Wheelchair Mobility    Modified Rankin (Stroke Patients Only)       Balance Overall balance assessment: Needs assistance         Standing balance support: No upper extremity supported Standing balance-Leahy Scale: Fair                               Pertinent Vitals/Pain Pain Assessment: No/denies pain    Home Living Family/patient expects to be discharged to:: Private residence Living Arrangements: Spouse/significant other   Type of Home: House Home Access: Level entry     Home Layout: One level Home Equipment: None      Prior Function Level of Independence: Independent               Hand Dominance        Extremity/Trunk Assessment        Lower Extremity Assessment Lower Extremity Assessment: Generalized weakness    Cervical / Trunk Assessment Cervical / Trunk Assessment: Normal  Communication   Communication: No difficulties  Cognition Arousal/Alertness: Awake/alert Behavior During Therapy: Flat affect Overall Cognitive Status: Within Functional Limits for tasks assessed  General Comments      Exercises     Assessment/Plan    PT Assessment Patient needs continued PT services  PT Problem List Decreased strength;Decreased mobility;Decreased balance;Decreased knowledge of use of DME;Decreased activity tolerance;Cardiopulmonary status limiting activity       PT Treatment Interventions Gait training;DME instruction;Therapeutic exercise;Stair training;Functional mobility training;Patient/family education;Therapeutic activities    PT Goals (Current goals can be  found in the Care Plan section)  Acute Rehab PT Goals PT Goal Formulation: With patient Time For Goal Achievement: 09/13/20 Potential to Achieve Goals: Good    Frequency Min 3X/week   Barriers to discharge        Co-evaluation               AM-PAC PT "6 Clicks" Mobility  Outcome Measure Help needed turning from your back to your side while in a flat bed without using bedrails?: A Little Help needed moving from lying on your back to sitting on the side of a flat bed without using bedrails?: A Little Help needed moving to and from a bed to a chair (including a wheelchair)?: A Little Help needed standing up from a chair using your arms (e.g., wheelchair or bedside chair)?: A Little Help needed to walk in hospital room?: A Little Help needed climbing 3-5 steps with a railing? : A Lot 6 Click Score: 17    End of Session Equipment Utilized During Treatment: Gait belt;Oxygen Activity Tolerance: Patient limited by fatigue Patient left: in chair;with call bell/phone within reach;with family/visitor present Nurse Communication: Mobility status PT Visit Diagnosis: Other abnormalities of gait and mobility (R26.89)    Time: 5041-3643 PT Time Calculation (min) (ACUTE ONLY): 15 min   Charges:   PT Evaluation $PT Eval Low Complexity: 1 Low     Kati PT, DPT Acute Rehabilitation Services Pager: [phone] Office: [phone]   Trena Platt 08/30/2020, 12:44 PM

## 2020-08-31 LAB — CBC
HCT: 21.7 % — ABNORMAL LOW (ref 39.0–52.0)
Hemoglobin: 7.5 g/dL — ABNORMAL LOW (ref 13.0–17.0)
MCH: 38.9 pg — ABNORMAL HIGH (ref 26.0–34.0)
MCHC: 34.6 g/dL (ref 30.0–36.0)
MCV: 112.4 fL — ABNORMAL HIGH (ref 80.0–100.0)
Platelets: 80 10*3/uL — ABNORMAL LOW (ref 150–400)
RBC: 1.93 MIL/uL — ABNORMAL LOW (ref 4.22–5.81)
WBC: 8.6 10*3/uL (ref 4.0–10.5)
nRBC: 0 % (ref 0.0–0.2)

## 2020-08-31 MED ORDER — PANTOPRAZOLE SODIUM 40 MG PO TBEC: 40.0000 mg | DELAYED_RELEASE_TABLET | Freq: Every day | ORAL | 1 refills | Status: DC

## 2020-08-31 MED ORDER — THIAMINE HCL 100 MG PO TABS: 100.0000 mg | ORAL_TABLET | Freq: Every day | ORAL | Status: DC

## 2020-08-31 MED ORDER — MULTI-VITAMIN/MINERALS PO TABS: 1.0000 | ORAL_TABLET | Freq: Every day | ORAL | Status: DC

## 2020-08-31 MED ORDER — LACTULOSE 10 GM/15ML PO SOLN: 10.0000 g | Freq: Two times a day (BID) | ORAL | 2 refills | Status: DC

## 2020-08-31 NOTE — TOC Progression Note (Signed)
Transition of Care [facility]) - Progression Note    Patient Details  Name: Eric Dennis MRN: [id number] Date of Birth: Aug 05, 1965  Transition of [facility] CM/SW Contact  Joaquin Courts, RN Phone Number: 08/31/2020, 12:36 PM  Clinical Narrative:    Confirmed need for home O2, [employer] rep notified,  agency will deliver portable tank to bedside for transport home.   Expected Discharge Plan: Home/Self Care Barriers to Discharge: Continued Medical Work up  Expected Discharge Plan and Services Expected Discharge Plan: Home/Self Care   Discharge Planning Services: CM Consult   Living arrangements for the past 2 months: Single Family Home                                       Social Determinants of Health (SDOH) Interventions    Readmission Risk Interventions No flowsheet data found.

## 2020-08-31 NOTE — Discharge Summary (Signed)
Physician Discharge Summary  Eric Dennis PXT:[id number] DOB: 10/07/1965 DOA: 08/22/2020  PCP: Scheryl Marten, PA  Admit date: 08/22/2020 Discharge date: 08/31/2020  Recommendations for Outpatient Follow-up:   Decompensated alcoholic cirrhosis with ascites, elevated LFTs, coagulopathy, hepatic encephalopathy, thrombocytopenia   Acute hypoxic respiratory failure secondary to aspiration pneumonia, blood -- Improving, stable at 2 L, asymptomatic.  Continue to wean as tolerated as an outpatient. Expect will be short-term need only.   Follow-up Information     [facility] Follow up.   Why: agency will provide home oxygen Contact information: [address] [place] [address] [phone]         Ronnette Juniper, MD Follow up on 09/05/2020.   Specialty: Gastroenterology Contact information: [place] [address] [phone]         Sabra Heck, [place], [place] Follow up in 2 week(s).   Specialty: Internal Medicine Contact information: [place] [address] [phone]                  Discharge Diagnoses: Principal diagnosis is #1 Principal Problem:   Acute blood loss anemia Active Problems:   Thrombocytopenia (HCC)   Hypokalemia   Hyponatremia   AKI (acute kidney injury) ([place])   Alcoholic cirrhosis of liver with ascites ([place])   Acute hypoxemic respiratory failure ([place])   Aspiration pneumonia ([place])   Discharge Condition: improved Disposition: home  Diet recommendation:  Diet Orders (From admission, onward)     Start     Ordered   08/31/20 0000  Diet - low sodium heart healthy        08/31/20 1356   08/30/20 1338  Diet regular Room service appropriate? Yes with Assist; Fluid consistency: Thin  Diet effective now       Question Answer Comment  Room service appropriate? Yes with Assist   Fluid consistency: Thin      08/30/20 1337             Filed Weights    08/29/20 0500 08/30/20 0628 08/31/20 0500  Weight: 84.4 kg 84.6 kg 86.2 kg   HPI/Hospital Course:   55 year old man PMH alcoholic cirrhosis presented with low hemoglobin, melena, abdominal pain, recent dental extraction.  Admitted for decompensated cirrhosis, acute blood loss anemia with supratherapeutic INR.  Seen by gastroenterology, treated with octreotide and PPI.  Endoscopic evaluation deferred given coagulopathy which did not correct despite FFP and vitamin K.  While GIB was initially suspected, bleeding appears to have been from gums only (s/p recent dental extraction) Course complicated by ongoing bleeding from gums, seen by dental medicine 6/18 sutures placed.  Required multiple units PRBC.  Course complicated by aspiration event, thought to be blood, and aspiration pneumonia with acute hypoxic respiratory failure. Hgb now stable, no bleeding noted, and weaned down oxygen to 2L. Will need home oxygen, likely short-term.  Acute blood loss anemia thought initially secondary to upper GI bleed, but now per GI thought to be related to bleeding from dental extraction site in the setting of coagulopathy secondary to alcoholic cirrhosis. Seen by oral surgery 6/18 for clot evacuation and suture of extraction site. Coagulopathy persistent despite vitamin K and FFP. -- no bleeding now, stable Hgb  Dental caries, status post recent dental extraction with ongoing intermittent bleeding in the setting of coagulopathy and thrombocytopenia. -- Follow-up with dental medicine as an outpatient.  Decompensated alcoholic cirrhosis with ascites, elevated LFTs, coagulopathy, hepatic encephalopathy, thrombocytopenia  no suspicion for SBP at this time.  MELD-Na score 31 with 27 to 32% 90-day mortality. --Ongoing alcohol use at home.  Recommended cessation.  Wife agrees. -- Outpatient follow-up with GI arranged for June 30.  Acute hypoxic respiratory failure secondary to aspiration pneumonia, blood -- Improving,  stable at 2 L, asymptomatic.  Continue to wean as tolerated as an outpatient.  AKI --resolved  Dysphagia oropharyngeal, unspecified --resolved, now on regular diet, thin liquids  Consults:  Dental medicine GI  Today's assessment: S: CC: f/u bleeding  Feels good, no bleeding Ready to go home No bleeding per RN  O: Vitals:  Vitals:   08/31/20 0808 08/31/20 0856  BP:  103/61  Pulse:  79  Resp:    Temp:    SpO2: 98%     Constitutional:  Appears calm and comfortable ENMT:  Mouth without bleeding Respiratory:  CTA bilaterally, no w/r/r.  Respiratory effort normal.  Cardiovascular:  RRR, no m/r/g Mild bilateral pedal edema   Psychiatric:  Mental status Mood, affect appropriate  Hgb stable 7.5 Plts up to 80  Discharge Instructions  Discharge Instructions     Diet - low sodium heart healthy   Complete by: As directed    Discharge instructions   Complete by: As directed    Call your physician or seek immediate medical attention for pain, confusion, bleeding, weakness, shortness of breath or worsening of condition   Increase activity slowly   Complete by: As directed       Allergies as of 08/31/2020   No Known Allergies      Medication List     STOP taking these medications    acetaminophen 500 MG tablet Commonly known as: TYLENOL   Vitamin D (Ergocalciferol) 1.25 MG (50000 UNIT) Caps capsule Commonly known as: DRISDOL       TAKE these medications    dimenhyDRINATE 50 MG tablet Commonly known as: DRAMAMINE Take 50 mg by mouth every 8 (eight) hours as needed for nausea or dizziness.   folic acid 1 MG tablet Commonly known as: FOLVITE Take 1 tablet (1 mg total) by mouth daily.   furosemide 40 MG tablet Commonly known as: LASIX Take 40 mg by mouth daily.   lactulose 10 GM/15ML solution Commonly known as: CHRONULAC Take 15 mLs (10 g total) by mouth 2 (two) times daily.   loperamide 2 MG capsule Commonly known as: IMODIUM Take 2 mg by  mouth daily as needed for diarrhea or loose stools.   multivitamin with minerals tablet Take 1 tablet by mouth daily.   pantoprazole 40 MG tablet Commonly known as: Protonix Take 1 tablet (40 mg total) by mouth daily.   Potassium 99 MG Tabs Take 99 mg by mouth daily.   spironolactone 100 MG tablet Commonly known as: ALDACTONE Take 100 mg by mouth daily.   thiamine 100 MG tablet Take 1 tablet (100 mg total) by mouth daily.   Vitamin B12 1000 MCG Tbcr Take 1,000 mcg by mouth daily.               Durable Medical Equipment  (From admission, onward)           Start     Ordered   08/31/20 1341  For home use only DME Cane  Once        08/31/20 1340   08/31/20 0747  For home use only DME oxygen  Once       Question Answer Comment  Length of Need 6 Months  Mode or (Route) Nasal cannula   Liters per Minute 2   Frequency Continuous (stationary and portable oxygen unit needed)   Oxygen delivery system Gas      08/31/20 0746           No Known Allergies  The results of significant diagnostics from this hospitalization (including imaging, microbiology, ancillary and laboratory) are listed below for reference.    Significant Diagnostic Studies: DG Chest Port 1 View  Result Date: 08/26/2020 CLINICAL DATA:  Tachypnea EXAM: PORTABLE CHEST 1 VIEW COMPARISON:  08/25/2020, 08/24/2020, 08/22/2020 FINDINGS: Extensive bilateral ground-glass opacities and patchy consolidations with dense left lung base consolidation. Probable small left effusion. Slight interval worsening since 08/25/2020. Stable cardiomediastinal silhouette. No pneumothorax. IMPRESSION: Slight interval worsening of extensive bilateral airspace disease. Small left effusion and dense left lung base consolidation. Electronically Signed   By: Donavan Foil M.D.   On: 08/26/2020 15:18   DG Chest Port 1 View  Result Date: 08/25/2020 CLINICAL DATA:  Dyspnea EXAM: PORTABLE CHEST 1 VIEW COMPARISON:  08/24/2020  FINDINGS: Cardiac shadow is stable. Increasing vascular congestion is noted with patchy airspace disease bilaterally also increased in the interval from the prior exam. These changes likely represent edema although developing pneumonia would deserve consideration as well. No bony abnormality is seen. IMPRESSION: Increasing vascular congestion and patchy airspace disease bilaterally. Edema and multifocal infiltrate again are primary considerations. Electronically Signed   By: Inez Catalina M.D.   On: 08/25/2020 15:29   DG Chest Port 1 View  Result Date: 08/24/2020 CLINICAL DATA:  Hemoptysis EXAM: PORTABLE CHEST 1 VIEW COMPARISON:  08/22/2020 FINDINGS: Interval development of diffuse bilateral airspace disease. Progression of left lower lobe consolidation. No significant effusion. IMPRESSION: Interval development of diffuse bilateral airspace disease which could be pneumonia, edema, or blood given history of hemoptysis. Progressive left lower lobe atelectasis. Electronically Signed   By: Franchot Gallo M.D.   On: 08/24/2020 16:24   DG Chest Port 1 View  Result Date: 08/22/2020 CLINICAL DATA:  Shortness of breath, hypoxia. EXAM: PORTABLE CHEST 1 VIEW COMPARISON:  October 07, 2018. FINDINGS: Borderline enlargement of the cardiac silhouette. Streaky left basilar opacities. No visible pleural effusions or pneumothorax. No acute osseous abnormality. IMPRESSION: Streaky left basilar opacities, which could represent atelectasis, aspiration, and/or pneumonia. Electronically Signed   By: Margaretha Sheffield MD   On: 08/22/2020 10:59   DG Swallowing Func-Speech Pathology  Result Date: 08/28/2020 Formatting of this result is different from the original. Objective Swallowing Evaluation: Type of Study: MBS-Modified Barium Swallow Study  Patient Details Name: EMANUEL DOWSON MRN: [id number] Date of Birth: 09-03-1965 Today's Date: 08/28/2020 Time: SLP Start Time (ACUTE ONLY): 1245 -SLP Stop Time (ACUTE ONLY): 1315 SLP Time  Calculation (min) (ACUTE ONLY): 30 min Past Medical History: Past Medical History: Diagnosis Date  Elevated liver function tests   High cholesterol   Weight loss  Past Surgical History: Past Surgical History: Procedure Laterality Date  COLONOSCOPY N/A 03/14/2014  Procedure: COLONOSCOPY;  Surgeon: Rogene Houston, MD;  [facility];  Service: Endoscopy;  Laterality: N/A;  730 - moved to 1/6 @ 12:00 - Ann notified pt  IR PARACENTESIS  07/19/2020  NO PAST SURGERIES   HPI: 55 yo with alcoholic cirrhosis, admitted with symptomatic severe anemia in setting of decompensated hepatic failure, coagulopathy. He had a tooth extracted 1 week ago and has been bleeding since. Admission Hgb 4.4, INR 3.1. He has had melena, treated with octreotide. Transfused PRBC, FFP and received Vit  K. Some lethargy and evidence mild hepatic encephalopathy - treated with lactulose PO. No EDG has been done yet.        Course complicated by apparent aspiration event and aspiration PNA. His presentation CXR was normal but has evolving B R>L infiltrates on subsequent films. He has been treated with Unasyn. O2 needs have ranged between 6 - 15 L/min over the last few days, currently on 10L/min.  Subjective: alert, at bedside; cooperative Assessment / Plan / Recommendation CHL IP CLINICAL IMPRESSIONS 08/28/2020 Clinical Impression Pt presents with mild oropharyngeal and cervical esophageal dysphagia.  He demonstrates decreased oral transiting, coordination likely due to oral discomfort/bleeding resulting in lingual pumping and premature spillage with piecemealing of large boluses.  Pharyngeal swallow marked by minimal decreased coordination of swallow and swallow triggering at pyriform sinus allowing minimal laryngeal penetration of thin x2 that did not clear larynx.  No aspiration noted and pt did cough during MBS x2.  Pharyngeal swallow was strong overall but decreased UES relaxation suspected *unable to view UES due to shoulder blocking view*  due to backflow of barium to distal pharynx.  Pt does not sense backflow and thus strict aspiration precautions advised.  Due to degree of oral deficits at this time - did not test chin tuck posture as this would worsen oral delay.   Pt able to orally transit liquid with barium tablet.  Recommend continue dys2/thin diet at this time.  Pt tends to take large boluses especially of liquids and decreasing bolus size may help with airway protection.  Encouraged pt to keep cough and "hock" strong to clear secretions/po as neede and rest if short of breath or coughing.  Will follow briefly to assure po tolerance, readiness for dietary advancement - pt reports his swallow ability to be normal. SLP Visit Diagnosis Dysphagia, oral phase (R13.11);Dysphagia, unspecified (R13.10) Attention and concentration deficit following -- Frontal lobe and executive function deficit following -- Impact on safety and function Mild aspiration risk;Moderate aspiration risk   CHL IP TREATMENT RECOMMENDATION 08/28/2020 Treatment Recommendations Therapy as outlined in treatment plan below   Prognosis 08/28/2020 Prognosis for Safe Diet Advancement Guarded Barriers to Reach Goals Time post onset Barriers/Prognosis Comment -- CHL IP DIET RECOMMENDATION 08/28/2020 SLP Diet Recommendations Dysphagia 3 (Mech soft) solids;Thin liquid Liquid Administration via Cup;Straw Medication Administration Whole meds with liquid Compensations Slow rate;Small sips/bites Postural Changes Remain semi-upright after after feeds/meals (Comment);Seated upright at 90 degrees   CHL IP OTHER RECOMMENDATIONS 08/28/2020 Recommended Consults -- Oral Care Recommendations Oral care BID Other Recommendations --   CHL IP FOLLOW UP RECOMMENDATIONS 08/27/2020 Follow up Recommendations Other (comment)   CHL IP FREQUENCY AND DURATION 08/28/2020 Speech Therapy Frequency (ACUTE ONLY) min 2x/week Treatment Duration 2 weeks      CHL IP ORAL PHASE 08/28/2020 Oral Phase Impaired Oral - Pudding  Teaspoon -- Oral - Pudding Cup -- Oral - Honey Teaspoon -- Oral - Honey Cup -- Oral - Nectar Teaspoon -- Oral - Nectar Cup Delayed oral transit;Weak lingual manipulation;Decreased bolus cohesion;Reduced posterior propulsion;Lingual/palatal residue;Piecemeal swallowing;Premature spillage Oral - Nectar Straw Delayed oral transit;Weak lingual manipulation;Decreased bolus cohesion;Reduced posterior propulsion;Piecemeal swallowing;Premature spillage Oral - Thin Teaspoon Delayed oral transit;Weak lingual manipulation;Decreased bolus cohesion;Reduced posterior propulsion;Piecemeal swallowing;Premature spillage Oral - Thin Cup Delayed oral transit;Weak lingual manipulation;Decreased bolus cohesion;Reduced posterior propulsion;Piecemeal swallowing;Premature spillage Oral - Thin Straw Delayed oral transit;Weak lingual manipulation;Reduced posterior propulsion;Piecemeal swallowing;Premature spillage;Decreased bolus cohesion Oral - Puree Delayed oral transit;Weak lingual manipulation;Decreased bolus cohesion;Reduced posterior propulsion;Piecemeal swallowing;Premature spillage Oral - Mech Soft  Delayed oral transit;Weak lingual manipulation;Decreased bolus cohesion;Reduced posterior propulsion;Piecemeal swallowing;Premature spillage Oral - Regular -- Oral - Multi-Consistency -- Oral - Pill Delayed oral transit;Weak lingual manipulation;Decreased bolus cohesion;Reduced posterior propulsion;Piecemeal swallowing;Premature spillage Oral Phase - Comment --  CHL IP PHARYNGEAL PHASE 08/28/2020 Pharyngeal Phase Impaired Pharyngeal- Pudding Teaspoon -- Pharyngeal -- Pharyngeal- Pudding Cup -- Pharyngeal -- Pharyngeal- Honey Teaspoon -- Pharyngeal -- Pharyngeal- Honey Cup -- Pharyngeal -- Pharyngeal- Nectar Teaspoon WFL;Pharyngeal residue - cp segment Pharyngeal -- Pharyngeal- Nectar Cup [facility];Pharyngeal residue - cp segment Pharyngeal Material does not enter airway Pharyngeal- Nectar Straw WFL;Pharyngeal residue - cp segment Pharyngeal  Material does not enter airway Pharyngeal- Thin Teaspoon Pharyngeal residue - pyriform;Delayed swallow initiation-pyriform sinuses;Pharyngeal residue - cp segment Pharyngeal Material does not enter airway Pharyngeal- Thin Cup Delayed swallow initiation-pyriform sinuses;Penetration/Aspiration during swallow;Pharyngeal residue - cp segment Pharyngeal Material enters airway, remains ABOVE vocal cords and not ejected out Pharyngeal- Thin Straw Delayed swallow initiation-pyriform sinuses;Penetration/Aspiration during swallow;Pharyngeal residue - cp segment Pharyngeal Material enters airway, remains ABOVE vocal cords and not ejected out Pharyngeal- Puree WFL;Pharyngeal residue - cp segment Pharyngeal Material does not enter airway Pharyngeal- Mechanical Soft WFL;Pharyngeal residue - cp segment Pharyngeal Material does not enter airway Pharyngeal- Regular -- Pharyngeal -- Pharyngeal- Multi-consistency WFL;Penetration/Aspiration during swallow;Pharyngeal residue - cp segment Pharyngeal Material enters airway, remains ABOVE vocal cords and not ejected out Pharyngeal- Pill -- Pharyngeal -- Pharyngeal Comment --  CHL IP CERVICAL ESOPHAGEAL PHASE 08/28/2020 Cervical Esophageal Phase Impaired Pudding Teaspoon -- Pudding Cup -- Honey Teaspoon -- Honey Cup -- Nectar Teaspoon -- Nectar Cup -- Nectar Straw -- Thin Teaspoon -- Thin Cup -- Thin Straw -- Puree -- Mechanical Soft -- Regular -- Multi-consistency -- Pill -- Cervical Esophageal Comment Decreased UES opening/relaxation resulting in backflow of barium into distal pharynx - not to the level of the larynx Kathleen Lime, MS [facility] SLP Acute Rehab Services Office [phone] Pager [phone] Macario Golds 08/28/2020, 2:21 PM              ECHOCARDIOGRAM LIMITED  Result Date: 08/26/2020    ECHOCARDIOGRAM LIMITED REPORT   Patient Name:   NAOMI FITTON Date of Exam: 08/26/2020 Medical Rec #:  [id number]       Height:       73.0 in Accession #:    [id number]      Weight:        184.5 lb Date of Birth:  Jul 07, 1965       BSA:          2.079 m Patient Age:    11 years        BP:           149/75 mmHg Patient Gender: M               HR:           106 bpm. Exam Location:  Inpatient Procedure: Limited Echo, Limited Color Doppler and Cardiac Doppler Indications:    Dyspnea R06.00  History:        Patient has prior history of Echocardiogram examinations, most                 recent 10/08/2018. Risk Factors:Hypertension.  Sonographer:    Tiffany Dance Referring Phys: [id number] Charlesetta Ivory GONFA IMPRESSIONS  1. Left ventricular ejection fraction, by estimation, is 55 to 60%. The left ventricle has normal function. There is mild left ventricular hypertrophy.  2. The pericardial effusion is posterior to the left ventricle. Moderate pleural effusion  in the left lateral region.  3. The mitral valve is abnormal. Trivial mitral valve regurgitation.  4. Aortic dilatation noted. There is mild dilatation of the aortic root, measuring 39 mm.  5. There is normal pulmonary artery systolic pressure.  6. The inferior vena cava is normal in size with <50% respiratory variability, suggesting right atrial pressure of 8 mmHg. Comparison(s): Changes from prior study are noted. 10/08/2018: LVEF 50-55%. FINDINGS  Left Ventricle: Left ventricular ejection fraction, by estimation, is 55 to 60%. The left ventricle has normal function. There is mild left ventricular hypertrophy. Right Ventricle: There is normal pulmonary artery systolic pressure. The tricuspid regurgitant velocity is 2.19 m/s, and with an assumed right atrial pressure of 8 mmHg, the estimated right ventricular systolic pressure is 54.5 mmHg. Left Atrium: Left atrial size was normal in size. Right Atrium: Right atrial size was normal in size. Pericardium: Trivial pericardial effusion is present. The pericardial effusion is posterior to the left ventricle. Mitral Valve: The mitral valve is abnormal. There is mild thickening of the mitral valve leaflet(s). Trivial mitral  valve regurgitation. Pulmonic Valve: The pulmonic valve was grossly normal. Pulmonic valve regurgitation is trivial. Aorta: Aortic dilatation noted. There is mild dilatation of the aortic root, measuring 39 mm. Venous: The inferior vena cava is normal in size with less than 50% respiratory variability, suggesting right atrial pressure of 8 mmHg. Additional Comments: There is a moderate pleural effusion in the left lateral region. Mild ascites is present. LEFT VENTRICLE PLAX 2D LVIDd:         5.10 cm LVIDs:         2.70 cm LV PW:         1.10 cm LV IVS:        1.00 cm LVOT diam:     2.20 cm LV SV:         67 LV SV Index:   32 LVOT Area:     3.80 cm  IVC IVC diam: 2.00 cm LEFT ATRIUM             Index LA diam:        3.90 cm 1.88 cm/m LA Vol (A2C):   49.8 ml 23.95 ml/m LA Vol (A4C):   39.7 ml 19.09 ml/m LA Biplane Vol: 44.9 ml 21.59 ml/m  AORTIC VALVE LVOT Vmax:   115.00 cm/s LVOT Vmean:  74.400 cm/s LVOT VTI:    0.175 m  AORTA Ao Root diam: 3.90 cm Ao Asc diam:  3.40 cm MITRAL VALVE                TRICUSPID VALVE MV Area (PHT): 4.35 cm     TR Peak grad:   19.2 mmHg MV Decel Time: 175 msec     TR Vmax:        219.00 cm/s MV E velocity: 112.00 cm/s MV A velocity: 130.00 cm/s  SHUNTS MV E/A ratio:  0.86         Systemic VTI:  0.18 m                             Systemic Diam: 2.20 cm Lyman Bishop MD Electronically signed by Lyman Bishop MD Signature Date/Time: 08/26/2020/1:07:07 PM    Final     Microbiology: Recent Results (from the past 240 hour(s))  Resp Panel by RT-PCR (Flu A&B, Covid) Nasopharyngeal Swab     Status: None   Collection Time: 08/22/20 10:55 AM  Specimen: Nasopharyngeal Swab; Nasopharyngeal(NP) swabs in vial transport medium  Result Value Ref Range Status   SARS Coronavirus 2 by RT PCR NEGATIVE NEGATIVE Final    Comment: (NOTE) SARS-CoV-2 target nucleic acids are NOT DETECTED.  The SARS-CoV-2 RNA is generally detectable in upper respiratory specimens during the acute phase of  infection. The lowest concentration of SARS-CoV-2 viral copies this assay can detect is 138 copies/mL. A negative result does not preclude SARS-Cov-2 infection and should not be used as the sole basis for treatment or other patient management decisions. A negative result may occur with  improper specimen collection/handling, submission of specimen other than nasopharyngeal swab, presence of viral mutation(s) within the areas targeted by this assay, and inadequate number of viral copies(<138 copies/mL). A negative result must be combined with clinical observations, patient history, and epidemiological information. The expected result is Negative.  Fact Sheet for Patients:  EntrepreneurPulse.com.au  Fact Sheet for Healthcare Providers:  IncredibleEmployment.be  This test is no t yet approved or cleared by the [place] FDA and  has been authorized for detection and/or diagnosis of SARS-CoV-2 by FDA under an Emergency Use Authorization (EUA). This EUA will remain  in effect (meaning this test can be used) for the duration of the COVID-19 declaration under Section 564(b)(1) of the Act, 21 U.S.C.section 360bbb-3(b)(1), unless the authorization is terminated  or revoked sooner.       Influenza A by PCR NEGATIVE NEGATIVE Final   Influenza B by PCR NEGATIVE NEGATIVE Final    Comment: (NOTE) The Xpert Xpress SARS-CoV-2/FLU/RSV plus assay is intended as an aid in the diagnosis of influenza from Nasopharyngeal swab specimens and should not be used as a sole basis for treatment. Nasal washings and aspirates are unacceptable for Xpert Xpress SARS-CoV-2/FLU/RSV testing.  Fact Sheet for Patients: EntrepreneurPulse.com.au  Fact Sheet for Healthcare Providers: IncredibleEmployment.be  This test is not yet approved or cleared by the [place] FDA and has been authorized for detection and/or diagnosis of SARS-CoV-2  by FDA under an Emergency Use Authorization (EUA). This EUA will remain in effect (meaning this test can be used) for the duration of the COVID-19 declaration under Section 564(b)(1) of the Act, 21 U.S.C. section 360bbb-3(b)(1), unless the authorization is terminated or revoked.  Performed at [facility], [place] [address] [place], [place] [address]   Culture, blood (routine x 2)     Status: None   Collection Time: 08/22/20 12:04 PM   Specimen: BLOOD LEFT FOREARM  Result Value Ref Range Status   Specimen Description   Final    BLOOD LEFT FOREARM Performed at [facility], [place] [address] [place], [place] [address]    Special Requests   Final    BOTTLES DRAWN AEROBIC AND ANAEROBIC Blood Culture adequate volume Performed at [place] [address], [place], [place] [address]    Culture   Final    NO GROWTH 5 DAYS Performed at [facility], [place] [address], [place], [place] [id number]    Report Status 08/27/2020 FINAL  Final  Culture, blood (routine x 2)     Status: None   Collection Time: 08/22/20 12:05 PM   Specimen: BLOOD RIGHT HAND  Result Value Ref Range Status   Specimen Description   Final    BLOOD RIGHT HAND Performed at [facility] [address], [place], [place] [address]    Special Requests   Final    BOTTLES DRAWN AEROBIC AND ANAEROBIC  Blood Culture adequate volume Performed at [facility] [address], [place], [place] [address]    Culture   Final    NO GROWTH 5 DAYS Performed at [facility], [place] [address], [place], [place] [address]    Report Status 08/27/2020 FINAL  Final  MRSA PCR Screening     Status: None   Collection Time: 08/22/20  1:40 PM  Result Value Ref Range Status   MRSA by PCR NEGATIVE NEGATIVE Final    Comment:        The GeneXpert MRSA Assay (FDA approved for NASAL specimens only), is one component of a comprehensive MRSA  colonization surveillance program. It is not intended to diagnose MRSA infection nor to guide or monitor treatment for MRSA infections. Performed at [facility], [place] [address] [place],  [address]      Labs: Basic Metabolic Panel: Recent Labs  Lab 08/25/20 0254 08/26/20 0315 08/26/20 8638 08/27/20 0222 08/28/20 0834 08/29/20 0544 08/30/20 0432  NA 135  --  131* 133* 132* 135 135  K 4.5  --  4.2 4.2 4.1 3.4* 3.5  CL 96*  --  93* 91* 90* 93* 94*  CO2 33*  --  30 35* 33* 36* 34*  GLUCOSE 113*  --  112* 118* 127* 103* 107*  BUN 9  --  10 13 18  21* 17  CREATININE 0.35*  --  0.49* 0.45* 0.42* 0.55* 0.59*  CALCIUM 8.5*  --  8.4* 8.3* 8.6* 8.4* 8.5*  MG 1.4* 1.6*  --  1.6* 1.8  --   --   PHOS 2.6 2.6 2.6 2.7 2.6  --   --    Liver Function Tests: Recent Labs  Lab 08/25/20 0254 08/26/20 0315 08/26/20 0828 08/27/20 0222 08/28/20 0834 08/29/20 0544  AST 61* 50*  --  53* 64* 62*  ALT 29 29  --  28 29 27   ALKPHOS 61 61  --  54 56 58  BILITOT 18.6* 18.0*  --  15.9* 14.1* 10.1*  PROT 6.4* 6.6  --  6.2* 6.3* 5.8*  ALBUMIN 2.4* 2.4* 2.3* 2.2* 2.2* 2.0*   No results for input(s): LIPASE, AMYLASE in the last 168 hours. Recent Labs  Lab 08/26/20 0315 08/27/20 0222 08/29/20 0544  AMMONIA 45* 52* 29   CBC: Recent Labs  Lab 08/28/20 0834 08/29/20 0544 08/29/20 0821 08/29/20 1954 08/30/20 0432 08/31/20 0551  WBC 6.5 7.6 7.4  --  7.9 8.6  HGB 11.1* 6.9* 6.6* 7.0* 7.4* 7.5*  HCT 32.5* 20.3* 18.5* 20.8* 21.9* 21.7*  MCV 104.2* 108.6* 108.2*  --  107.9* 112.4*  PLT 46* 76* 72*  --  64* 80*    Principal Problem:   Acute blood loss anemia Active Problems:   Thrombocytopenia (H[facility])   Hypokalemia   Hyponatremia   AKI (acute kidney injury) ([place])   Alcoholic cirrhosis of liver with ascites ([place])   Acute hypoxemic respiratory failure ([place])   Aspiration pneumonia ([place])   Time coordinating discharge: 35 minutes  Signed:  Murray Hodgkins,  MD  [facility]  08/31/2020, 1:59 PM

## 2020-08-31 NOTE — TOC Progression Note (Signed)
Transition of Care [facility]) - Progression Note    Patient Details  Name: Eric Dennis MRN: [id number] Date of Birth: [birth year]  Transition of Care [facility] CM/SW Contact  Joaquin Courts, RN Phone Number: 08/31/2020, 2:08 PM  Clinical Narrative:   [employer] rep contacted for dme cane, to be delivered to bedside for home use.   Expected Discharge Plan: Home/Self Care Barriers to Discharge: Continued Medical Work up  Expected Discharge Plan and Services Expected Discharge Plan: Home/Self Care   Discharge Planning Services: CM Consult   Living arrangements for the past 2 months: Single Family Home Expected Discharge Date: 08/31/20               DME Arranged: Kasandra Knudsen DME Agency: [employer] Date DME Agency Contacted: 08/31/20 Time DME Agency Contacted: [phone] Representative spoke with at DME Agency: [email or web address]             Social Determinants of Health (SDOH) Interventions    Readmission Risk Interventions No flowsheet data found.

## 2020-08-31 NOTE — Plan of Care (Signed)
  Problem: Health Behavior/Discharge Planning: Goal: Ability to manage health-related needs will improve Outcome: Progressing   Problem: Clinical Measurements: Goal: Will remain free from infection Outcome: Progressing Goal: Respiratory complications will improve Outcome: Progressing   Problem: Activity: Goal: Risk for activity intolerance will decrease Outcome: Progressing

## 2020-09-02 DIAGNOSIS — J9601 Acute respiratory failure with hypoxia: Secondary | ICD-10-CM | POA: Diagnosis not present

## 2020-09-05 DIAGNOSIS — K7031 Alcoholic cirrhosis of liver with ascites: Secondary | ICD-10-CM | POA: Diagnosis not present

## 2020-09-06 DIAGNOSIS — D696 Thrombocytopenia, unspecified: Secondary | ICD-10-CM | POA: Diagnosis not present

## 2020-09-06 DIAGNOSIS — K7031 Alcoholic cirrhosis of liver with ascites: Secondary | ICD-10-CM | POA: Diagnosis not present

## 2020-09-06 DIAGNOSIS — D62 Acute posthemorrhagic anemia: Secondary | ICD-10-CM | POA: Diagnosis not present

## 2020-09-06 DIAGNOSIS — N179 Acute kidney failure, unspecified: Secondary | ICD-10-CM | POA: Diagnosis not present

## 2020-09-19 DIAGNOSIS — R110 Nausea: Secondary | ICD-10-CM | POA: Diagnosis not present

## 2020-09-19 DIAGNOSIS — K7031 Alcoholic cirrhosis of liver with ascites: Secondary | ICD-10-CM | POA: Diagnosis not present

## 2020-10-02 DIAGNOSIS — J9601 Acute respiratory failure with hypoxia: Secondary | ICD-10-CM | POA: Diagnosis not present

## 2020-10-03 ENCOUNTER — Other Ambulatory Visit: Payer: Self-pay | Admitting: Gastroenterology

## 2020-10-03 DIAGNOSIS — K7031 Alcoholic cirrhosis of liver with ascites: Secondary | ICD-10-CM | POA: Diagnosis not present

## 2020-10-03 DIAGNOSIS — D649 Anemia, unspecified: Secondary | ICD-10-CM | POA: Diagnosis not present

## 2020-10-03 DIAGNOSIS — K219 Gastro-esophageal reflux disease without esophagitis: Secondary | ICD-10-CM | POA: Diagnosis not present

## 2020-10-14 DIAGNOSIS — J9601 Acute respiratory failure with hypoxia: Secondary | ICD-10-CM | POA: Diagnosis not present

## 2020-10-29 NOTE — Progress Notes (Signed)
Attempted to obtain medical history via telephone, unable to reach at this time. I left a voicemail to return pre surgical testing department's phone call.  

## 2020-10-31 DIAGNOSIS — K7031 Alcoholic cirrhosis of liver with ascites: Secondary | ICD-10-CM | POA: Diagnosis not present

## 2020-10-31 DIAGNOSIS — D649 Anemia, unspecified: Secondary | ICD-10-CM | POA: Diagnosis not present

## 2020-10-31 DIAGNOSIS — R110 Nausea: Secondary | ICD-10-CM | POA: Diagnosis not present

## 2020-11-02 DIAGNOSIS — J9601 Acute respiratory failure with hypoxia: Secondary | ICD-10-CM | POA: Diagnosis not present

## 2020-11-04 ENCOUNTER — Ambulatory Visit (HOSPITAL_COMMUNITY): Payer: BC Managed Care – PPO | Admitting: Anesthesiology

## 2020-11-04 ENCOUNTER — Encounter (HOSPITAL_COMMUNITY): Payer: Self-pay | Admitting: Gastroenterology

## 2020-11-04 ENCOUNTER — Encounter (HOSPITAL_COMMUNITY)
Admission: RE | Disposition: A | Discharge status: Home / Self Care | Payer: Self-pay | Source: Home / Self Care | Attending: Gastroenterology

## 2020-11-04 ENCOUNTER — Ambulatory Visit (HOSPITAL_COMMUNITY)
Admission: RE | Admit: 2020-11-04 | Discharge: 2020-11-04 | Disposition: A | Discharge status: Home / Self Care | Payer: BC Managed Care – PPO | Attending: Gastroenterology | Admitting: Gastroenterology

## 2020-11-04 ENCOUNTER — Other Ambulatory Visit: Payer: Self-pay

## 2020-11-04 DIAGNOSIS — Z8719 Personal history of other diseases of the digestive system: Secondary | ICD-10-CM | POA: Insufficient documentation

## 2020-11-04 DIAGNOSIS — D122 Benign neoplasm of ascending colon: Secondary | ICD-10-CM | POA: Insufficient documentation

## 2020-11-04 DIAGNOSIS — Z79899 Other long term (current) drug therapy: Secondary | ICD-10-CM | POA: Diagnosis not present

## 2020-11-04 DIAGNOSIS — K7460 Unspecified cirrhosis of liver: Secondary | ICD-10-CM | POA: Diagnosis not present

## 2020-11-04 DIAGNOSIS — K5730 Diverticulosis of large intestine without perforation or abscess without bleeding: Secondary | ICD-10-CM | POA: Diagnosis not present

## 2020-11-04 DIAGNOSIS — Z8249 Family history of ischemic heart disease and other diseases of the circulatory system: Secondary | ICD-10-CM | POA: Insufficient documentation

## 2020-11-04 DIAGNOSIS — K7031 Alcoholic cirrhosis of liver with ascites: Secondary | ICD-10-CM | POA: Insufficient documentation

## 2020-11-04 DIAGNOSIS — K648 Other hemorrhoids: Secondary | ICD-10-CM | POA: Insufficient documentation

## 2020-11-04 DIAGNOSIS — I8510 Secondary esophageal varices without bleeding: Secondary | ICD-10-CM | POA: Insufficient documentation

## 2020-11-04 DIAGNOSIS — D509 Iron deficiency anemia, unspecified: Secondary | ICD-10-CM | POA: Insufficient documentation

## 2020-11-04 DIAGNOSIS — I868 Varicose veins of other specified sites: Secondary | ICD-10-CM | POA: Diagnosis not present

## 2020-11-04 DIAGNOSIS — K766 Portal hypertension: Secondary | ICD-10-CM | POA: Insufficient documentation

## 2020-11-04 DIAGNOSIS — F17210 Nicotine dependence, cigarettes, uncomplicated: Secondary | ICD-10-CM | POA: Diagnosis not present

## 2020-11-04 DIAGNOSIS — K3189 Other diseases of stomach and duodenum: Secondary | ICD-10-CM | POA: Insufficient documentation

## 2020-11-04 HISTORY — PX: ESOPHAGOGASTRODUODENOSCOPY (EGD) WITH PROPOFOL: SHX5813

## 2020-11-04 HISTORY — PX: POLYPECTOMY: SHX5525

## 2020-11-04 HISTORY — PX: COLONOSCOPY WITH PROPOFOL: SHX5780

## 2020-11-04 HISTORY — PX: ESOPHAGEAL BANDING: SHX5518

## 2020-11-04 SURGERY — COLONOSCOPY WITH PROPOFOL
Anesthesia: Monitor Anesthesia Care

## 2020-11-04 MED ORDER — ONDANSETRON HCL 4 MG/2ML IJ SOLN
INTRAMUSCULAR | Status: DC | PRN
  Administered 2020-11-04: 4 mg via INTRAVENOUS

## 2020-11-04 MED ORDER — PROPOFOL 500 MG/50ML IV EMUL
INTRAVENOUS | Status: DC | PRN
  Administered 2020-11-04: 125 ug/kg/min via INTRAVENOUS

## 2020-11-04 MED ORDER — LACTATED RINGERS IV SOLN
INTRAVENOUS | Status: AC | PRN
  Administered 2020-11-04: 10 mL/h via INTRAVENOUS

## 2020-11-04 MED ORDER — PROPOFOL 10 MG/ML IV BOLUS
INTRAVENOUS | Status: DC | PRN
  Administered 2020-11-04: 20 mg via INTRAVENOUS
  Administered 2020-11-04: 50 mg via INTRAVENOUS

## 2020-11-04 MED ORDER — SODIUM CHLORIDE 0.9 % IV SOLN: INTRAVENOUS | Status: DC

## 2020-11-04 MED ORDER — PROPOFOL 10 MG/ML IV BOLUS
INTRAVENOUS | Status: AC
  Filled 2020-11-04: qty 20

## 2020-11-04 MED ORDER — LIDOCAINE 2% (20 MG/ML) 5 ML SYRINGE
INTRAMUSCULAR | Status: DC | PRN
  Administered 2020-11-04: 100 mg via INTRAVENOUS

## 2020-11-04 SURGICAL SUPPLY — 24 items

## 2020-11-04 NOTE — Transfer of Care (Signed)
Immediate Anesthesia Transfer of Care Note  Patient: Eric Dennis  Procedure(s) Performed: COLONOSCOPY WITH PROPOFOL ESOPHAGOGASTRODUODENOSCOPY (EGD) WITH PROPOFOL ESOPHAGEAL BANDING POLYPECTOMY  Patient Location: Endoscopy Unit  Anesthesia Type:MAC  Level of Consciousness: sedated and patient cooperative  Airway & Oxygen Therapy: Patient Spontanous Breathing and Patient connected to face mask oxygen  Post-op Assessment: Report given to RN, Post -op Vital signs reviewed and stable and Patient moving all extremities  Post vital signs: Reviewed and stable  Last Vitals:  Vitals Value Taken Time  BP 126/71 11/04/20 1424  Temp    Pulse 81 11/04/20 1425  Resp 13 11/04/20 1425  SpO2 97 % 11/04/20 1425  Vitals shown include unvalidated device data.  Last Pain:  Vitals:   11/04/20 1246  TempSrc: Oral  PainSc: 0-No pain         Complications: No notable events documented.

## 2020-11-04 NOTE — Anesthesia Postprocedure Evaluation (Signed)
Anesthesia Post Note  Patient: Eric Dennis  Procedure(s) Performed: COLONOSCOPY WITH PROPOFOL ESOPHAGOGASTRODUODENOSCOPY (EGD) WITH PROPOFOL ESOPHAGEAL BANDING POLYPECTOMY     Patient location during evaluation: PACU Anesthesia Type: MAC Level of consciousness: awake and alert Pain management: pain level controlled Vital Signs Assessment: post-procedure vital signs reviewed and stable Respiratory status: spontaneous breathing, nonlabored ventilation, respiratory function stable and patient connected to nasal cannula oxygen Cardiovascular status: stable and blood pressure returned to baseline Postop Assessment: no apparent nausea or vomiting Anesthetic complications: no   No notable events documented.  Last Vitals:  Vitals:   11/04/20 1430 11/04/20 1443  BP: 113/60 129/71  Pulse: 84 72  Resp: 15 14  Temp:    SpO2: 100% 95%    Last Pain:  Vitals:   11/04/20 1443  TempSrc:   PainSc: 0-No pain                 Reginold Beale S

## 2020-11-04 NOTE — Discharge Instructions (Signed)

## 2020-11-04 NOTE — Op Note (Signed)
[facility] Patient Name: Eric Dennis Procedure Date: 11/04/2020 MRN: [id number] Attending MD: Ronnette Juniper , MD Date of Birth: 1965-08-19 CSN: [id number] Age: 55 Admit Type: Outpatient Procedure:                Upper GI endoscopy Indications:              Iron deficiency anemia, Cirrhosis rule out                            esophageal varices Providers:                Ronnette Juniper, MD, Dulcy Fanny, Hinton Dyer Referring MD:             Mick Sell Medicines:                Monitored Anesthesia Care Complications:            No immediate complications. Estimated Blood Loss:     Estimated blood loss: none. Procedure:                Pre-Anesthesia Assessment:                           - Prior to the procedure, a History and Physical                            was performed, and patient medications and                            allergies were reviewed. The patient's tolerance of                            previous anesthesia was also reviewed. The risks                            and benefits of the procedure and the sedation                            options and risks were discussed with the patient.                            All questions were answered, and informed consent                            was obtained. Prior Anticoagulants: The patient has                            taken no previous anticoagulant or antiplatelet                            agents. ASA Grade Assessment: III - A patient with                            severe systemic disease. After reviewing the risks  and benefits, the patient was deemed in                            satisfactory condition to undergo the procedure.                           After obtaining informed consent, the endoscope was                            passed under direct vision. Throughout the                            procedure, the patient's blood pressure, pulse, and                             oxygen saturations were monitored continuously. The                            [id number] Olympus endoscope was introduced                            through the mouth, and advanced to the second part                            of duodenum. The upper GI endoscopy was                            accomplished without difficulty. The patient                            tolerated the procedure well. Scope In: Scope Out: Findings:      The upper third of the esophagus and middle third of the esophagus were       normal.      Grade II varices, 3 separate columuns were found in the lower third of       the esophagus. They were medium in size.They would flatten on       insufflation. Three bands were successfully placed with complete       eradication, resulting in deflation of varices. There was no bleeding       during and at the end of the procedure.      The Z-line was regular and was found 40 cm from the incisors.      Moderate portal hypertensive gastropathy was found in the gastric body.      Easy oozing and friability noted with passage of scope.      The cardia and gastric fundus were normal on retroflexion. No evidence       of gastric varices.      The examined duodenum was normal. Impression:               - Normal upper third of esophagus and middle third                            of esophagus.                           -  Grade II esophageal varices. Completely                            eradicated. Banded.                           - Z-line regular, 40 cm from the incisors.                           - Portal hypertensive gastropathy.                           - Normal examined duodenum.                           - No specimens collected. Moderate Sedation:      Patient did not receive moderate sedation for this procedure, but       instead received monitored anesthesia care. Recommendation:           - Patient has a contact number available for                             emergencies. The signs and symptoms of potential                            delayed complications were discussed with the                            patient. Return to normal activities tomorrow.                            Written discharge instructions were provided to the                            patient.                           - Clear liquid diet for 5 hours, advance as                            tolerated thereafter.                           - Repeat upper endoscopy in 6 months for                            retreatment. Procedure Code(s):        --- Professional ---                           [phone], Esophagogastroduodenoscopy, flexible,                            transoral; with band ligation of esophageal/gastric                            varices Diagnosis Code(s):        ---  Professional ---                           K74.60, Unspecified cirrhosis of liver                           I85.10, Secondary esophageal varices without                            bleeding                           K76.6, Portal hypertension                           K31.89, Other diseases of stomach and duodenum                           D50.9, Iron deficiency anemia, unspecified CPT copyright 2019 American Medical Association. All rights reserved. The codes documented in this report are preliminary and upon coder review may  be revised to meet current compliance requirements. Ronnette Juniper, MD 11/04/2020 2:26:16 PM This report has been signed electronically. Number of Addenda: 0

## 2020-11-04 NOTE — Anesthesia Preprocedure Evaluation (Signed)
Anesthesia Evaluation  Patient identified by MRN, date of birth, ID band Patient awake    Reviewed: Allergy & Precautions, NPO status , Patient's Chart, lab work & pertinent test results  Airway Mallampati: II  TM Distance: >3 FB Neck ROM: Full    Dental no notable dental hx.    Pulmonary neg pulmonary ROS, Current Smoker and Patient abstained from smoking.,    Pulmonary exam normal breath sounds clear to auscultation       Cardiovascular + Valvular Problems/Murmurs MR  Rhythm:Regular Rate:Normal + Systolic murmurs 1. Left ventricular ejection fraction, by estimation, is 55 to 60%. The left ventricle has normal function. There is mild left ventricular hypertrophy. 2. The pericardial effusion is posterior to the left ventricle. Moderate pleural effusion in the left lateral region. 3. The mitral valve is abnormal. Trivial mitral valve regurgitation. 4. Aortic dilatation noted. There is mild dilatation of the aortic root, measuring 39 mm. 5. There is normal pulmonary artery systolic pressure. 6. The inferior vena cava is normal in size with <   Neuro/Psych negative neurological ROS  negative psych ROS   GI/Hepatic negative GI ROS, (+) Cirrhosis   Esophageal Varices and ascites    ,   Endo/Other  negative endocrine ROS  Renal/GU negative Renal ROS  negative genitourinary   Musculoskeletal negative musculoskeletal ROS (+)   Abdominal   Peds negative pediatric ROS (+)  Hematology negative hematology ROS (+)   Anesthesia Other Findings   Reproductive/Obstetrics negative OB ROS                             Anesthesia Physical Anesthesia Plan  ASA: 3  Anesthesia Plan: MAC   Post-op Pain Management:    Induction: Intravenous  PONV Risk Score and Plan: 1 and Propofol infusion and Treatment may vary due to age or medical condition  Airway Management Planned: Simple Face Mask  Additional  Equipment:   Intra-op Plan:   Post-operative Plan:   Informed Consent: I have reviewed the patients History and Physical, chart, labs and discussed the procedure including the risks, benefits and alternatives for the proposed anesthesia with the patient or authorized representative who has indicated his/her understanding and acceptance.     Dental advisory given  Plan Discussed with: CRNA and Surgeon  Anesthesia Plan Comments:         Anesthesia Quick Evaluation

## 2020-11-04 NOTE — H&P (Signed)
History of Present Illness  General:          [id number] with alcoholic cirrhosis and hepatitis        MELDNa was 21 on 09/19/20        Wt. 182.6 lbs        scheduled for EGD and colon at [facility] on 11/04/20        Labs on 10/03/20 showed Hb 10, MCV 115.4, Plt 97, Cr 0.72, TB/ASt/ALT/ALP of 6.1/66/36/214, MELDNa of 19        He seems to have lost 16 lbs since his last OV.        He c/o nausea and did not get ondansteron on his last visit.        HE feels like he is back to 80% of his baseline.        He has marked improvement with leg and abdominal swelling.        He has 2 BMs a day, denies blood in stool or black stools.        HE last had alcohol on 08/22/2020.        He denies acid reflux or heartburn, denies difficulty or pain on swallowing.        He has abdominal pain, mild, around mid abdomen, likely related to hernia.        Denies nocturnal symptoms, but wakes up with nausea intermittently.     Current Medications  Taking  Dulcolax(Docusate Sodium) 5 MG Tablet Delayed Release 4 tablets Orally Once a day GaviLyte-N with Flavor Pack(PEG 3350-KCl-Na Bicarb-NaCl) 420 GM Solution Reconstituted 4070m Orally As directed Pantoprazole Sodium 40 MG Tablet Delayed Release 1 tablet Orally Once a day Spironolactone 100 MG Tablet 1 tablet Orally Once a day Thiamine HCl 100 MG Tablet 1 tablet Orally Once a day Vitamin B12 1000 MCG Tablet Extended Release 1 tablet Orally Once a day Ondansetron HCl 8 MG Tablet 1 tablet as needed for nausea Orally Once a day Multi Vitamin - Tablet 1 tablet Orally Once a day dimenhyDRINATE 50 MG Tablet 1 tablet as needed Orally every 6 hrs Folic Acid 1 MG Tablet 1 tablet Orally Once a day Furosemide 40 MG Tablet 1 tablet Orally Once a day traMADol HCl 50 MG Tablet 1 tablet as needed every 12 hours Orally twice a day    Past Medical History       Dr. KDelton Coombes heme/onc eval thrombocytopenia, lymphadenopathy 02/2019.       Cigarette nicotine dependence.        Elevated liver enzymes 10/2018.       Colonoscopy: 2016.       Hernia.       Alcholic cirrhosis.       Hepatitis.   Surgical History        colonoscopy 2016       hernia repair 04/2020    Family History  Father: alive  Mother: alive, diagnosed with Diabetes, Hypertension  Negative for colon cancer, colon polyps or liver disease.      Social History  General:   Tobacco use       cigarettes:  Current smoker     Frequency:   6 cigarettes per day     Estimated Pack-years:  30     Tobacco history last updated  10/31/2020 EXPOSURE TO PASSIVE SMOKE: yes.  no Alcohol, quit 08/22/20.  no Recreational drug use.     Allergies  N.K.D.A.       Hospitalization/Major Diagnostic Procedure  blood  pressure ,liver, blood transfusion 08/2020  none since 06/22 ans 04/2020 hernia 09/2020  none since 06/22 10/2020     Review of Systems  GI PROCEDURE:          Pacemaker/ AICD no.  Artificial heart valves no.  MI/heart attack no.  Abnormal heart rhythm no.  Angina no.  CVA no.  Hypertension no.  Hypotension YES.  Asthma, COPD no.  Sleep apnea no.  Seizure disorders no.  Artificial joints no.  Severe DJD no.  Diabetes no.  Significant headaches no.  Vertigo no.  Depression/anxiety no.  Abnormal bleeding no.  Kidney Disease no.  Liver disease YES- hepatitis, and cirrhosis.  Chance of pregnancy no.  Blood transfusion YES.         Vital Signs  Wt 166.0, Wt change -16.6 lb, Ht 77, BMI 19.68, Temp 98.1, Pulse sitting 87, BP sitting 117/65.     Examination  Gastroenterology::        GENERAL APPEARANCE: Well developed, well nourished, no active distress, pleasant.         SCLERA:  icteric.         CARDIOVASCULAR Normal RRR .         RESPIRATORY Breath sounds normal. Respiration even and unlabored.         ABDOMEN No masses palpated. Liver and spleen not palpated, normal. Bowel sounds normal, abdominal wall hernia, no ascites(no shifting dullness or fluid thrill).         EXTREMITIES: No edema.          NEURO: alert, oriented to time, place and person, normal gait.         PSYCH: mood/affect normal.     Labs from 10/31/2020 showed PT 16.7, INR 1.6 Hemoglobin 9.3, MCV 112.7, platelet 107 T bili/AST/ALT/ALP of 8.5/49/35/209 BUN/creatinine of 16/0.8 MELDNa is 23  Assessments   1. Alcoholic cirrhosis of liver with ascites - K70.31 (Primary)  2. Anemia, unspecified type - D64.9  3. Nausea - R11.0     Treatment  1. Alcoholic cirrhosis of liver with ascites         LAB: PT (Prothrombin Time) [id number])       LAB: CBC without Diff       LAB: Comp Metabolic Panel Notes: Doing well, has not had alcohol since 08/22/20. Advised to continue furosemide 40 mg and spironolactone 100 mg a day. Continues folic acid, MVI and thiamine. F/U in 2 months. Advised to remain abstinent from alcohol use. Most likely will be able to return to work soon.      2. Anemia, unspecified type   Notes: Scheduled for EGD and colonoscopy at [facility] on 11/04/20.   If esophageal varices noted, plan variceal banding.  3. Nausea   Start Ondansetron HCl Tablet, 4 MG, 1 tablet, Orally, twice a day as needed, 30 day(s), 60, Refills 1     The risks and the benefits of the procedure were discussed with the patient in details. He understands and verbalizes consent.

## 2020-11-04 NOTE — Op Note (Signed)
[facility] Patient Name: Eric Dennis Procedure Date: 11/04/2020 MRN: [id number] Attending MD: Ronnette Juniper , MD Date of Birth: Jul 21, 1965 CSN: [id number] Age: 55 Admit Type: Outpatient Procedure:                Colonoscopy Indications:              Last colonoscopy: 2016, Iron deficiency anemia Providers:                Ronnette Juniper, MD, Dulcy Fanny, Hinton Dyer Referring MD:             Mick Sell Medicines:                Monitored Anesthesia Care Complications:            No immediate complications. Estimated blood loss:                            Minimal. Estimated Blood Loss:     Estimated blood loss was minimal. Procedure:                Pre-Anesthesia Assessment:                           - Prior to the procedure, a History and Physical                            was performed, and patient medications and                            allergies were reviewed. The patient's tolerance of                            previous anesthesia was also reviewed. The risks                            and benefits of the procedure and the sedation                            options and risks were discussed with the patient.                            All questions were answered, and informed consent                            was obtained. Prior Anticoagulants: The patient has                            taken no previous anticoagulant or antiplatelet                            agents. ASA Grade Assessment: III - A patient with                            severe systemic disease. After reviewing the risks  and benefits, the patient was deemed in                            satisfactory condition to undergo the procedure.                           - Prior to the procedure, a History and Physical                            was performed, and patient medications and                            allergies were reviewed. The patient's tolerance of                             previous anesthesia was also reviewed. The risks                            and benefits of the procedure and the sedation                            options and risks were discussed with the patient.                            All questions were answered, and informed consent                            was obtained. Prior Anticoagulants: The patient has                            taken no previous anticoagulant or antiplatelet                            agents. ASA Grade Assessment: III - A patient with                            severe systemic disease. After reviewing the risks                            and benefits, the patient was deemed in                            satisfactory condition to undergo the procedure.                           After obtaining informed consent, the colonoscope                            was passed under direct vision. Throughout the                            procedure, the patient's blood pressure, pulse, and  oxygen saturations were monitored continuously. The                            [id number] Olympus colonoscope was                            introduced through the anus and advanced to the the                            cecum, identified by appendiceal orifice and                            ileocecal valve. The colonoscopy was performed                            without difficulty. The patient tolerated the                            procedure well. The quality of the bowel                            preparation was fair. Scope In: 1:57:54 PM Scope Out: 2:15:16 PM Scope Withdrawal Time: 0 hours 10 minutes 52 seconds  Total Procedure Duration: 0 hours 17 minutes 22 seconds  Findings:      The perianal and digital rectal examinations were normal except for a       small skin tear.      A 6 mm polyp was found in the ascending colon. The polyp was sessile.       The polyp was removed with a  cold biopsy forceps. Resection and       retrieval were complete.      Multiple small and large-mouthed diverticula were found in the sigmoid       colon, hepatic flexure and ascending colon.      Medium sized, non-bleeding rectal varices were found.      Non-bleeding internal hemorrhoids were found during retroflexion, during       perianal exam and during endoscopy. The hemorrhoids were large. Impression:               - Preparation of the colon was fair.                           - One 6 mm polyp in the ascending colon, removed                            with a cold biopsy forceps. Resected and retrieved.                           - Diverticulosis in the sigmoid colon, at the                            hepatic flexure and in the ascending colon.                           - Rectal varices.                           -  Non-bleeding internal hemorrhoids. Moderate Sedation:      Patient did not receive moderate sedation for this procedure, but       instead received monitored anesthesia care. Recommendation:           - Patient has a contact number available for                            emergencies. The signs and symptoms of potential                            delayed complications were discussed with the                            patient. Return to normal activities tomorrow.                            Written discharge instructions were provided to the                            patient.                           - High fiber diet.                           - Continue present medications.                           - Await pathology results.                           - Repeat colonoscopy for surveillance based on                            pathology results. Procedure Code(s):        --- Professional ---                           [phone], Colonoscopy, flexible; with biopsy, single                            or multiple Diagnosis Code(s):        --- Professional ---                            K64.8, Other hemorrhoids                           K63.5, Polyp of colon                           D50.9, Iron deficiency anemia, unspecified                           K57.30, Diverticulosis of large intestine without                            perforation  or abscess without bleeding CPT copyright 2019 [employer]. All rights reserved. The codes documented in this report are preliminary and upon coder review may  be revised to meet current compliance requirements. Ronnette Juniper, MD 11/04/2020 2:30:26 PM This report has been signed electronically. Number of Addenda: 0

## 2020-11-05 LAB — SURGICAL PATHOLOGY

## 2020-11-27 DIAGNOSIS — K7030 Alcoholic cirrhosis of liver without ascites: Secondary | ICD-10-CM | POA: Diagnosis not present

## 2021-01-18 ENCOUNTER — Other Ambulatory Visit: Payer: Self-pay

## 2021-01-18 ENCOUNTER — Emergency Department (HOSPITAL_COMMUNITY)
Admission: EM | Admit: 2021-01-18 | Discharge: 2021-01-18 | Disposition: A | Discharge status: Home / Self Care | Payer: Medicaid Other | Attending: Emergency Medicine | Admitting: Emergency Medicine

## 2021-01-18 ENCOUNTER — Encounter (HOSPITAL_COMMUNITY): Payer: Self-pay

## 2021-01-18 DIAGNOSIS — F17210 Nicotine dependence, cigarettes, uncomplicated: Secondary | ICD-10-CM | POA: Diagnosis not present

## 2021-01-18 DIAGNOSIS — R000 Tachycardia, unspecified: Secondary | ICD-10-CM | POA: Insufficient documentation

## 2021-01-18 DIAGNOSIS — R112 Nausea with vomiting, unspecified: Secondary | ICD-10-CM | POA: Diagnosis present

## 2021-01-18 DIAGNOSIS — R197 Diarrhea, unspecified: Secondary | ICD-10-CM | POA: Diagnosis not present

## 2021-01-18 DIAGNOSIS — Z79899 Other long term (current) drug therapy: Secondary | ICD-10-CM | POA: Diagnosis not present

## 2021-01-18 DIAGNOSIS — E876 Hypokalemia: Secondary | ICD-10-CM | POA: Insufficient documentation

## 2021-01-18 LAB — URINALYSIS, ROUTINE W REFLEX MICROSCOPIC
Bilirubin Urine: NEGATIVE
Glucose, UA: NEGATIVE mg/dL
Ketones, ur: NEGATIVE mg/dL
Nitrite: NEGATIVE
Protein, ur: 30 mg/dL — AB
Specific Gravity, Urine: 1.02 (ref 1.005–1.030)
WBC, UA: >50 WBC/hpf — ABNORMAL HIGH (ref 0–5)
pH: 5 (ref 5.0–8.0)

## 2021-01-18 LAB — LIPASE, BLOOD: Lipase: 47 U/L (ref 11–51)

## 2021-01-18 LAB — CBC WITH DIFFERENTIAL/PLATELET
Abs Immature Granulocytes: 0.02 10*3/uL (ref 0.00–0.07)
Basophils Absolute: 0 10*3/uL (ref 0.0–0.1)
Basophils Relative: 1 %
Eosinophils Absolute: 0 10*3/uL (ref 0.0–0.5)
Eosinophils Relative: 0 %
HCT: 37.3 % — ABNORMAL LOW (ref 39.0–52.0)
Hemoglobin: 13.8 g/dL (ref 13.0–17.0)
Immature Granulocytes: 0 %
Lymphocytes Relative: 8 %
Lymphs Abs: 0.7 10*3/uL (ref 0.7–4.0)
MCH: 40.9 pg — ABNORMAL HIGH (ref 26.0–34.0)
MCHC: 37 g/dL — ABNORMAL HIGH (ref 30.0–36.0)
MCV: 110.7 fL — ABNORMAL HIGH (ref 80.0–100.0)
Monocytes Absolute: 0.9 10*3/uL (ref 0.1–1.0)
Monocytes Relative: 11 %
Neutro Abs: 7 10*3/uL (ref 1.7–7.7)
Neutrophils Relative %: 80 %
Platelets: 91 10*3/uL — ABNORMAL LOW (ref 150–400)
RBC: 3.37 MIL/uL — ABNORMAL LOW (ref 4.22–5.81)
RDW: 12.7 % (ref 11.5–15.5)
WBC: 8.7 10*3/uL (ref 4.0–10.5)
nRBC: 0 % (ref 0.0–0.2)

## 2021-01-18 LAB — COMPREHENSIVE METABOLIC PANEL
ALT: 46 U/L — ABNORMAL HIGH (ref 0–44)
AST: 90 U/L — ABNORMAL HIGH (ref 15–41)
Albumin: 3.5 g/dL (ref 3.5–5.0)
Alkaline Phosphatase: 139 U/L — ABNORMAL HIGH (ref 38–126)
Anion gap: 16 — ABNORMAL HIGH (ref 5–15)
BUN: 48 mg/dL — ABNORMAL HIGH (ref 6–20)
CO2: 35 mmol/L — ABNORMAL HIGH (ref 22–32)
Calcium: 9.9 mg/dL (ref 8.9–10.3)
Chloride: 83 mmol/L — ABNORMAL LOW (ref 98–111)
Creatinine, Ser: 1.59 mg/dL — ABNORMAL HIGH (ref 0.61–1.24)
GFR, Estimated: 51 mL/min — ABNORMAL LOW (ref >60)
Glucose, Bld: 170 mg/dL — ABNORMAL HIGH (ref 70–99)
Potassium: 3 mmol/L — ABNORMAL LOW (ref 3.5–5.1)
Sodium: 134 mmol/L — ABNORMAL LOW (ref 135–145)
Total Bilirubin: 13.4 mg/dL — ABNORMAL HIGH (ref 0.3–1.2)
Total Protein: 8.3 g/dL — ABNORMAL HIGH (ref 6.5–8.1)

## 2021-01-18 MED ORDER — ONDANSETRON 4 MG PO TBDP
4.0000 mg | ORAL_TABLET | Freq: Once | ORAL | Status: AC
  Administered 2021-01-18: 4 mg via ORAL
  Filled 2021-01-18: qty 1

## 2021-01-18 MED ORDER — ONDANSETRON HCL 8 MG PO TABS: 8.0000 mg | ORAL_TABLET | Freq: Three times a day (TID) | ORAL | 0 refills | Status: DC | PRN

## 2021-01-18 NOTE — ED Triage Notes (Signed)
Pt c/o nausea and vomiting since yesterday. Denies abdominal pain

## 2021-01-18 NOTE — ED Provider Notes (Signed)
Emergency Medicine Provider Triage Evaluation Note  Eric Dennis , a 55 y.o. male  was evaluated in triage.  Pt complains of nausea and vomiting. He states that his symptoms began yesterday. He states that he has experienced approximately 14 episodes of 'dark brown' emesis in the past 24 hours. He also endorses a few episodes of diarrhea that he states are the same color as his vomit. He denies abdominal pain or fevers. No known sick contacts. History of liver cirrhosis, with hernia repair surgery in February  Review of Systems  Positive: Nausea, vomiting, diarrhea Negative: Fevers, chills, abdominal pain  Physical Exam  BP 120/75 (BP Location: Left Arm)   Pulse (!) 123   Temp 98.2 F (36.8 C) (Oral)   Resp 12   SpO2 98%  Gen:   Awake, no distress   Resp:  Normal effort  MSK:   Moves extremities without difficulty  Other:    Medical Decision Making  Medically screening exam initiated at 3:35 PM.  Appropriate orders placed.  Eric Dennis was informed that the remainder of the evaluation will be completed by another provider, this initial triage assessment does not replace that evaluation, and the importance of remaining in the ED until their evaluation is complete.     Eric Dennis 01/18/21 1538    Tegeler, Gwenyth Allegra, MD 01/19/21 [phone]

## 2021-01-18 NOTE — ED Provider Notes (Signed)
[facility] DEPT Provider Note   CSN: [id number] Arrival date & time: 01/18/21  1511     History Chief Complaint  Patient presents with   Emesis   Nausea    Eric Dennis is a 55 y.o. male.  HPI He presents for evaluation of nausea, vomiting and frequent stooling for 1.5 days.  He states that the emesis and stool are "dark."  He denies fever.  He is not having chest pain, shortness of breath, cough, weakness or dizziness.  He denies abdominal pain, back pain, dysuria, hematuria or constipation/diarrhea he states that he saw his GI doctor for checkup a couple weeks ago. He had upper and lower endoscopies done 2.5 months ago, during which she was treated for esophageal varices with banding procedure.  Evaluation was otherwise normal.  Past Medical History:  Diagnosis Date   Elevated liver function tests    High cholesterol    Weight loss     Patient Active Problem List   Diagnosis Date Noted   Hypokalemia 08/22/2020   Hyponatremia 08/22/2020   AKI (acute kidney injury) ([place]) 61/95/0932   Alcoholic cirrhosis of liver with ascites ([place]) 08/22/2020   Acute blood loss anemia 08/22/2020   Acute hypoxemic respiratory failure ([place]) 08/22/2020   Aspiration pneumonia ([place]) 08/22/2020   Lymphadenopathy 02/16/2019   Orthostatic hypotension 10/08/2018   Thrombocytopenia ([place]) 10/08/2018   Transaminasemia    Syncope 10/07/2018   Idiopathic hypotension    Elevated liver enzymes 01/25/2014   Loss of weight 01/25/2014   TOBACCO ABUSE 08/13/2006   ALLERGIC RHINITIS 08/13/2006    Past Surgical History:  Procedure Laterality Date   COLONOSCOPY N/A 03/14/2014   Procedure: COLONOSCOPY;  Surgeon: Rogene Houston, MD;  Location: [facility];  Service: Endoscopy;  Laterality: N/A;  730 - moved to 1/6 @ 12:00 - Ann notified pt   COLONOSCOPY WITH PROPOFOL N/A 11/04/2020   Procedure: COLONOSCOPY WITH PROPOFOL;  Surgeon: Ronnette Juniper, MD;  Location: [facility]  ENDOSCOPY;  Service: Gastroenterology;  Laterality: N/A;   ESOPHAGEAL BANDING  11/04/2020   Procedure: ESOPHAGEAL BANDING;  Surgeon: Ronnette Juniper, MD;  Location: [facility] ENDOSCOPY;  Service: Gastroenterology;;   ESOPHAGOGASTRODUODENOSCOPY (EGD) WITH PROPOFOL N/A 11/04/2020   Procedure: ESOPHAGOGASTRODUODENOSCOPY (EGD) WITH PROPOFOL;  Surgeon: Ronnette Juniper, MD;  Location: [facility] ENDOSCOPY;  Service: Gastroenterology;  Laterality: N/A;   IR PARACENTESIS  07/19/2020   NO PAST SURGERIES     POLYPECTOMY  11/04/2020   Procedure: POLYPECTOMY;  Surgeon: Ronnette Juniper, MD;  Location: [facility] ENDOSCOPY;  Service: Gastroenterology;;       Family History  Problem Relation Age of Onset   Diabetes Mother    Heart disease Maternal Grandmother     Social History   Tobacco Use   Smoking status: Every Day    Packs/day: 0.25    Years: 20.00    Pack years: 5.00    Types: Cigarettes   Smokeless tobacco: Never   Tobacco comments:    1 pack every 4 days x 20 yrs  Vaping Use   Vaping Use: Never used  Substance Use Topics   Alcohol use: Yes    Alcohol/week: 14.0 standard drinks    Types: 14 Cans of beer per week   Drug use: No    Home Medications Prior to Admission medications   Medication Sig Start Date End Date Taking? Authorizing Provider  ondansetron (ZOFRAN) 8 MG tablet Take 1 tablet (8 mg total) by mouth every 8 (eight) hours as needed for nausea  or vomiting. 01/18/21  Yes Daleen Bo, MD  folic acid (FOLVITE) 1 MG tablet Take 1 tablet (1 mg total) by mouth daily. 10/10/18   Orson Eva, MD  furosemide (LASIX) 40 MG tablet Take 40 mg by mouth daily. 01/13/19   [provider]  ibuprofen (ADVIL) 200 MG tablet Take 400 mg by mouth every 6 (six) hours as needed for headache or moderate pain.    [provider]  lactulose (CHRONULAC) 10 GM/15ML solution Take 15 mLs (10 g total) by mouth 2 (two) times daily. Patient not taking: Reported on 10/29/2020 08/31/20   Samuella Cota, MD  meloxicam  (MOBIC) 15 MG tablet Take 15 mg by mouth daily.    [provider]  Multiple Vitamins-Minerals (MULTIVITAMIN WITH MINERALS) tablet Take 1 tablet by mouth daily. Patient not taking: Reported on 10/29/2020 08/31/20 08/31/21  Samuella Cota, MD  pantoprazole (PROTONIX) 40 MG tablet Take 1 tablet (40 mg total) by mouth daily. 08/31/20 10/30/20  Samuella Cota, MD  POTASSIUM PO Take 1 tablet by mouth daily.    [provider]  spironolactone (ALDACTONE) 100 MG tablet Take 100 mg by mouth daily. 08/08/20   [provider]  thiamine 100 MG tablet Take 1 tablet (100 mg total) by mouth daily. Patient not taking: Reported on 10/29/2020 08/31/20   Samuella Cota, MD  vitamin B-12 (CYANOCOBALAMIN) 500 MCG tablet Take 500 mcg by mouth daily.    [provider]    Allergies    Patient has no known allergies.  Review of Systems   Review of Systems  All other systems reviewed and are negative.  Physical Exam Updated Vital Signs BP 106/68   Pulse (!) 109   Temp 98.2 F (36.8 C) (Oral)   Resp 19   Ht 6\' 4"  (1.93 m)   Wt 77 kg   SpO2 92%   BMI 20.66 kg/m   Physical Exam Vitals and nursing note reviewed.  Constitutional:      General: He is not in acute distress.    Appearance: He is well-developed. He is not ill-appearing, toxic-appearing or diaphoretic.  HENT:     Head: Normocephalic and atraumatic.     Right Ear: External ear normal.     Left Ear: External ear normal.  Eyes:     Conjunctiva/sclera: Conjunctivae normal.     Pupils: Pupils are equal, round, and reactive to light.  Neck:     Trachea: Phonation normal.  Cardiovascular:     Rate and Rhythm: Normal rate and regular rhythm.     Heart sounds: Normal heart sounds.  Pulmonary:     Effort: Pulmonary effort is normal.     Breath sounds: Normal breath sounds.  Abdominal:     General: There is no distension.     Palpations: Abdomen is soft.     Tenderness: There is no abdominal  tenderness. There is no guarding.  Musculoskeletal:        General: Normal range of motion.     Cervical back: Normal range of motion and neck supple.  Skin:    General: Skin is warm and dry.  Neurological:     Mental Status: He is alert and oriented to person, place, and time.     Cranial Nerves: No cranial nerve deficit.     Sensory: No sensory deficit.     Motor: No abnormal muscle tone.     Coordination: Coordination normal.  Psychiatric:  Mood and Affect: Mood normal.        Behavior: Behavior normal.        Thought Content: Thought content normal.        Judgment: Judgment normal.    ED Results / Procedures / Treatments   Labs (all labs ordered are listed, but only abnormal results are displayed) Labs Reviewed  CBC WITH DIFFERENTIAL/PLATELET - Abnormal; Notable for the following components:      Result Value   RBC 3.37 (*)    HCT 37.3 (*)    MCV 110.7 (*)    MCH 40.9 (*)    MCHC 37.0 (*)    Platelets 91 (*)    All other components within normal limits  COMPREHENSIVE METABOLIC PANEL - Abnormal; Notable for the following components:   Sodium 134 (*)    Potassium 3.0 (*)    Chloride 83 (*)    CO2 35 (*)    Glucose, Bld 170 (*)    BUN 48 (*)    Creatinine, Ser 1.59 (*)    Total Protein 8.3 (*)    AST 90 (*)    ALT 46 (*)    Alkaline Phosphatase 139 (*)    Total Bilirubin 13.4 (*)    GFR, Estimated 51 (*)    Anion gap 16 (*)    All other components within normal limits  URINALYSIS, ROUTINE W REFLEX MICROSCOPIC - Abnormal; Notable for the following components:   Color, Urine AMBER (*)    APPearance CLOUDY (*)    Hgb urine dipstick SMALL (*)    Protein, ur 30 (*)    Leukocytes,Ua MODERATE (*)    WBC, UA >50 (*)    Bacteria, UA MANY (*)    All other components within normal limits  LIPASE, BLOOD    EKG EKG Interpretation  Date/Time:  Saturday January 18 2021 15:25:02 EST Ventricular Rate:  110 PR Interval:  157 QRS Duration: 105 QT  Interval:  378 QTC Calculation: 512 R Axis:   -55 Text Interpretation: Sinus tachycardia Ventricular premature complex Biatrial enlargement RSR' in V1 or V2, probably normal variant Left ventricular hypertrophy Prolonged QT interval Since last tracing pvc new, and QT is shorter, and there is criteria for LVH Otherwise no significant change Confirmed by Daleen Bo [phone]) on 01/18/2021 5:51:28 PM  Radiology No results found.  Procedures Procedures   Medications Ordered in ED Medications  ondansetron (ZOFRAN-ODT) disintegrating tablet 4 mg (4 mg Oral Given 01/18/21 1539)  ondansetron (ZOFRAN-ODT) disintegrating tablet 4 mg (4 mg Oral Given 01/18/21 1923)    ED Course  I have reviewed the triage vital signs and the nursing notes.  Pertinent labs & imaging results that were available during my care of the patient were reviewed by me and considered in my medical decision making (see chart for details).    MDM Rules/Calculators/A&P                            Patient Vitals for the past 24 hrs:  BP Temp Temp src Pulse Resp SpO2 Height Weight  01/18/21 2115 106/68 -- -- (!) 109 19 92 % -- --  01/18/21 2045 93/68 -- -- 99 18 94 % -- --  01/18/21 2015 108/69 -- -- 96 18 94 % -- --  01/18/21 2000 103/73 -- -- (!) 101 18 95 % -- --  01/18/21 1940 -- -- -- -- -- -- 6\' 4"  (1.93 m) 77 kg  01/18/21  1930 111/73 -- -- 97 18 97 % -- --  01/18/21 1903 119/77 -- -- 97 18 95 % -- --  01/18/21 1900 119/77 -- -- -- -- -- -- --  01/18/21 1530 -- -- -- (!) 116 14 94 % -- --  01/18/21 1519 120/75 98.2 F (36.8 C) Oral (!) 123 12 98 % -- --    9:22 PM Reevaluation with update and discussion. After initial assessment and treatment, an updated evaluation reveals tolerating oral liquids and asking to go home.  Findings discussed and questions answered. Daleen Bo   Medical Decision Making:  This patient is presenting for evaluation of vomiting and loose bowel movements, which does require a  range of treatment options, and is a complaint that involves a moderate risk of morbidity and mortality. The differential diagnoses include enteritis, food poisoning, complications from hepatic disease. I decided to review old records, and in summary millage male, being followed by GI for hepatic cirrhosis.  He has history of AKI, anemia, respiratory failure.  I did not require additional historical information from anyone.  Clinical Laboratory Tests Ordered, included CBC, Metabolic panel, Urinalysis, and lipase . Review indicates normal except elevated MCV/Red cell indices, platelets low, worsening transaminases and total bilirubin, with creatinine elevated from baseline, BUN high, CO2 high, potassium low, chloride low.   Cardiac Monitor Tracing which shows sinus tachycardia    Critical Interventions-clinical evaluation, laboratory testing, medication treatment  After These Interventions, the Patient was reevaluated and was found stable for discharge.  Nonspecific nausea vomiting.  Minimal diarrhea.  Nonspecific symptoms, differential diagnosis includes enteritis and food poisoning.  No metabolic disorders or suggestion for impending vascular collapse.  Abnormal urinalysis but no symptoms for UTI.  He has chronic stable end-stage liver disease.  No indication for hospitalization.  CRITICAL CARE-no Performed by: Daleen Bo  Nursing Notes Reviewed/ Care Coordinated Applicable Imaging Reviewed Interpretation of Laboratory Data incorporated into ED treatment  The patient appears reasonably screened and/or stabilized for discharge and I doubt any other medical condition or other [facility] requiring further screening, evaluation, or treatment in the ED at this time prior to discharge.  Plan: Home Medications-continue usual; Home Treatments-gradually advance diet; return here if the recommended treatment, does not improve the symptoms; Recommended follow up-PCP, PRN     Final Clinical  Impression(s) / ED Diagnoses Final diagnoses:  Nausea and vomiting, unspecified vomiting type    Rx / DC Orders ED Discharge Orders          Ordered    ondansetron (ZOFRAN) 8 MG tablet  Every 8 hours PRN        01/18/21 2121             Daleen Bo, MD 01/18/21 2342

## 2021-01-18 NOTE — ED Notes (Signed)
Patient provided with ice water per request for PO challenge.

## 2021-01-18 NOTE — Discharge Instructions (Addendum)
Start with a clear liquid diet then gradually advance to regular foods after a day or 2.  See your doctor as needed for problems.

## 2021-01-18 NOTE — ED Notes (Signed)
Patient tolerated PO. Eulis Foster, MD made aware.

## 2021-04-07 ENCOUNTER — Other Ambulatory Visit: Payer: Self-pay | Admitting: Gastroenterology

## 2021-05-05 ENCOUNTER — Encounter (HOSPITAL_COMMUNITY): Payer: Self-pay | Admitting: Gastroenterology

## 2021-05-05 NOTE — Progress Notes (Signed)
Attempted to obtain medical history via telephone, unable to reach at this time. I left a voicemail to return pre surgical testing department's phone call.  

## 2021-05-12 NOTE — H&P (Signed)
History of Present Illness  ?General:   ?       55/male, last seen on 38/25/05 for alcoholic cirrhosis and hepatitis ?       His weight was 178 lbs, weighed 180 lbs in the past 2 months, without leg swelling or abdominal distension. ?       He reports a better appetite, rarely feels nauseous and denies vomiting. ?       He reports Bms 1/day, denies blood in stool or black stools, he has not been taking it. ?       His mental status is "fine" as his wife reports he is not confused, is not slow to respond and denies memory loss. ?       He is on disability and is not working. ?       He reports that he is not expected to go back to work. ?       He continues to drink beer, 2 a day, "budlight", no hard liquor. ?       He has been planning to stop drinking, last alcohol use was yesterday. ?       He has not required any more dental work and denies bleeding from gums, nose. ?       He needed TUMS about 2 weeks, he takes pantoprazole daily, denies trouble swallowing or pain on swallowing. ?       Denies abdominal or rectal pain. ?       Colon 8/22: 1 TA,repeat in 18yr ?       EGD 8/22: Grade 3 Esophageal varices, 3 bands placed, porta hepatis gastropathy, repeat in 6 ?       lab 02/04/21: BUN 11, creatinine 0.9, albumin 3.1, TB/AST/ALT/ALP of 5.1/135/68/205, PT 15.9, INR 1.5, hemoglobin 10.4, MCV 114.3, platelet 103 ?       MELDNa 17.  ?  ? ?Current Medications  ?Pantoprazole Sodium 40 MG Tablet Delayed Release 1 tablet Orally Once a day ?Spironolactone 100 MG Tablet 1 tablet Orally Once a day ?Thiamine HCl 100 MG Tablet 1 tablet Orally Once a day ?Vitamin B12 1000 MCG Tablet Extended Release 1 tablet Orally Once a day ?Ondansetron HCl 8 MG Tablet 1 tablet as needed for nausea Orally Once a day ?Multi Vitamin - Tablet 1 tablet Orally Once a day ?dimenhyDRINATE 50 MG Tablet 1 tablet as needed Orally every 6 hrs ?Folic Acid 1 MG Tablet 1 tablet Orally Once a day  ?Furosemide 40 MG Tablet 1 tablet Orally Once a day ?traMADol  HCl 50 MG Tablet 1 tablet as needed every 12 hours Orally twice a day  ? ?Past Medical History  ?     Dr. KDelton Coombes heme/onc eval thrombocytopenia, lymphadenopathy 02/2019. ?     Cigarette nicotine dependence. ?     Elevated liver enzymes 10/2018. ?     Colonoscopy ?     Hernia. ?     Alcholic cirrhosis. ?     Hepatitis.   ? ?Surgical History  ?      colonoscopy 2016  ?      hernia repair 04/2020  ? ?Family History  ?Father: alive  ?Mother: alive, diagnosed with Diabetes, Hypertension  ?Negative for colon cancer, colon polyps or liver disease.  ?  ?Social History  ?General:   ?Tobacco use   ?    cigarettes:  Current smoker ?    Frequency:   6 cigarettes per day ?    Estimated Pack-years:  30 ?    Tobacco history last updated  04/07/2021 ?EXPOSURE TO PASSIVE SMOKE: yes.  ?Alcohol: yes, occasionally.  ?no Recreational drug use.   ?  ? ?Allergies  ?N.K.D.A.   ?  ? ?Hospitalization/Major Diagnostic Procedure  ?blood pressure ,liver, blood transfusion 08/2020  ?none since 06/22 ans 04/2020 hernia 09/2020  ?stomach virus 01/18/2021  ?none since 01/2021 03/2021   ? ?Review of Systems  ?GI PROCEDURE:   ?       Pacemaker/ AICD no.  Artificial heart valves no.  MI/heart attack no.  Abnormal heart rhythm no.  Angina no.  CVA no.  Hypertension no.  Hypotension no.  Asthma, COPD no.  Sleep apnea no.  Seizure disorders no.  Artificial joints no.  Diabetes no.  Significant headaches no.  Vertigo no.  Depression/anxiety no.  Abnormal bleeding no.  Kidney Disease no.  Liver disease YES.  Blood transfusion YES, 2 .       ?  ? ? ?Vital Signs  ?Wt 188.2, Wt change 10.2 lbs, Ht 77, BMI 22.31, Temp 98.6, Pulse sitting 98, BP sitting 112/73.   ? ?Examination  ?Gastroenterology:: ?       GENERAL APPEARANCE: Well developed, well nourished, no active distress, pleasant.  ?       SCLERA: anicteric.  ?       CARDIOVASCULAR Normal RRR .  ?       RESPIRATORY Breath sounds normal. Respiration even and unlabored.  ?       ABDOMEN No masses  palpated. Liver and spleen not palpated, normal. Bowel sounds normal, Abdomen not distended.  ?       EXTREMITIES: No edema.  ?       NEURO: alert, oriented to time, place and person, normal gait, no asterixis but has mild tremors.  ?       PSYCH: mood/affect normal.  ?  ? ?Assessments   ?1. Alcoholic cirrhosis of liver without ascites - K70.30 (Primary)  ?2. Unspecified cirrhosis of liver - K74.60  ?3. Secondary esophageal varices without bleeding - I85.10  ?4. Alcohol use - Z78.9  ?5. Hepatic encephalopathy - K76.82   ? ?Treatment  ?1. Alcoholic cirrhosis of liver without ascites   ?      LAB: CBC without Diff ?      LAB: Comp Metabolic Panel ?      LAB: PT (Prothrombin Time) ([id number]) ?      LAB: AFP, Serum, Tumor Marker ([id number]) ?      IMAGING: IH US Abdominal, Complete 76700 ?            Notes: No ascites or pedal edema with a normal BP and HR. ?Continue furosemide 40 mg and spironolactone 100 mg a day. ?Will get an [place] in 05/2021 for [facility] screening and labs today for monitoring.     ?  ? 2. Unspecified cirrhosis of liver   ?Notes: He continues to drink beers daily. ?He is aware of his diagnosis and complications and that he is not a candidate for consideration for liver transplant due to ongoing use of alcohol.     ? ?3. Secondary esophageal varices without bleeding   ?      IMAGING: EGD with Band Ligation of Varices ?Notes: Will schedule him for EGD at [facility] for repeat banding.     ? ?4. Alcohol use   ?Notes: He continues to drink beers daily. ?He is aware of his diagnosis and complications and that he is not a  candidate for consideration for liver transplant due to ongoing use of alcohol. ?Currently, on folic acid, Thiamine, U02 and MVI.     ? ?5. Hepatic encephalopathy   ?Notes: Currently AAOx3, and no asterixis. ?His wife and patient understand need to continue lactulose, however, he doesn't take it as he has regular BMs and does not want to have diarrhea. ?I have recommended to his wife that if he is slow to  respond, appeared confused or lethargic, he needs to the give lactulose upto 3-4 times a day until mental status clears from elevated ammonia.     ? ?  ? ? ?

## 2021-05-12 NOTE — Anesthesia Preprocedure Evaluation (Addendum)
Anesthesia Evaluation  ?Patient identified by MRN, date of birth, ID band ?Patient awake ? ? ? ?Reviewed: ?Allergy & Precautions, NPO status , Patient's Chart, lab work & pertinent test results ? ?Airway ?Mallampati: I ? ?TM Distance: >3 FB ?Neck ROM: Full ? ? ? Dental ? ?(+) Poor Dentition, Chipped, Missing, Dental Advisory Given ?  ?Pulmonary ?neg pulmonary ROS, Current Smoker and Patient abstained from smoking.,  ?  ?Pulmonary exam normal ?breath sounds clear to auscultation ? ? ? ? ? ? Cardiovascular ?negative cardio ROS ?Normal cardiovascular exam ?Rhythm:Regular Rate:Normal ? ?TTE 2022 ?1. Left ventricular ejection fraction, by estimation, is 55 to 60%. The  ?left ventricle has normal function. There is mild left ventricular  ?hypertrophy.  ??2. The pericardial effusion is posterior to the left ventricle. Moderate  ?pleural effusion in the left lateral region.  ??3. The mitral valve is abnormal. Trivial mitral valve regurgitation.  ??4. Aortic dilatation noted. There is mild dilatation of the aortic root,  ?measuring 39 mm.  ??5. There is normal pulmonary artery systolic pressure.  ??6. The inferior vena cava is normal in size with <50% respiratory  ?variability, suggesting right atrial pressure of 8 mmHg. ?  ?Neuro/Psych ?negative neurological ROS ? negative psych ROS  ? GI/Hepatic ?negative GI ROS, (+) Cirrhosis  (gastric varices) ? ascites ? substance abuse ? alcohol use,   ?Endo/Other  ?negative endocrine ROS ? Renal/GU ?negative Renal ROS  ?negative genitourinary ?  ?Musculoskeletal ?negative musculoskeletal ROS ?(+)  ? Abdominal ?  ?Peds ? Hematology ?negative hematology ROS ?(+)   ?Anesthesia Other Findings ? ? Reproductive/Obstetrics ? ?  ? ? ? ? ? ? ? ? ? ? ? ? ? ?  ?  ? ? ? ? ? ? ? ?Anesthesia Physical ?Anesthesia Plan ? ?ASA: 3 ? ?Anesthesia Plan: MAC  ? ?Post-op Pain Management:   ? ?Induction: Intravenous ? ?PONV Risk Score and Plan: Propofol infusion and  Treatment may vary due to age or medical condition ? ?Airway Management Planned: Natural Airway ? ?Additional Equipment:  ? ?Intra-op Plan:  ? ?Post-operative Plan:  ? ?Informed Consent: I have reviewed the patients History and Physical, chart, labs and discussed the procedure including the risks, benefits and alternatives for the proposed anesthesia with the patient or authorized representative who has indicated his/her understanding and acceptance.  ? ? ? ?Dental advisory given ? ?Plan Discussed with: CRNA ? ?Anesthesia Plan Comments:   ? ? ? ? ? ? ?Anesthesia Quick Evaluation ? ?

## 2021-05-13 ENCOUNTER — Other Ambulatory Visit: Payer: Self-pay

## 2021-05-13 ENCOUNTER — Ambulatory Visit (HOSPITAL_BASED_OUTPATIENT_CLINIC_OR_DEPARTMENT_OTHER): Payer: Medicaid Other | Admitting: Anesthesiology

## 2021-05-13 ENCOUNTER — Ambulatory Visit (HOSPITAL_COMMUNITY)
Admission: RE | Admit: 2021-05-13 | Discharge: 2021-05-13 | Disposition: A | Discharge status: Home / Self Care | Payer: Medicaid Other | Attending: Gastroenterology | Admitting: Gastroenterology

## 2021-05-13 ENCOUNTER — Encounter (HOSPITAL_COMMUNITY)
Admission: RE | Disposition: A | Discharge status: Home / Self Care | Payer: Self-pay | Source: Home / Self Care | Attending: Gastroenterology

## 2021-05-13 ENCOUNTER — Ambulatory Visit (HOSPITAL_COMMUNITY): Payer: Medicaid Other | Admitting: Anesthesiology

## 2021-05-13 ENCOUNTER — Encounter (HOSPITAL_COMMUNITY): Payer: Self-pay | Admitting: Gastroenterology

## 2021-05-13 DIAGNOSIS — I8510 Secondary esophageal varices without bleeding: Secondary | ICD-10-CM | POA: Insufficient documentation

## 2021-05-13 DIAGNOSIS — K259 Gastric ulcer, unspecified as acute or chronic, without hemorrhage or perforation: Secondary | ICD-10-CM | POA: Insufficient documentation

## 2021-05-13 DIAGNOSIS — I8500 Esophageal varices without bleeding: Secondary | ICD-10-CM | POA: Diagnosis not present

## 2021-05-13 DIAGNOSIS — K7682 Hepatic encephalopathy: Secondary | ICD-10-CM | POA: Diagnosis not present

## 2021-05-13 DIAGNOSIS — K3189 Other diseases of stomach and duodenum: Secondary | ICD-10-CM | POA: Insufficient documentation

## 2021-05-13 DIAGNOSIS — K269 Duodenal ulcer, unspecified as acute or chronic, without hemorrhage or perforation: Secondary | ICD-10-CM | POA: Diagnosis not present

## 2021-05-13 DIAGNOSIS — K279 Peptic ulcer, site unspecified, unspecified as acute or chronic, without hemorrhage or perforation: Secondary | ICD-10-CM

## 2021-05-13 DIAGNOSIS — F17210 Nicotine dependence, cigarettes, uncomplicated: Secondary | ICD-10-CM | POA: Insufficient documentation

## 2021-05-13 DIAGNOSIS — K766 Portal hypertension: Secondary | ICD-10-CM

## 2021-05-13 DIAGNOSIS — K7030 Alcoholic cirrhosis of liver without ascites: Secondary | ICD-10-CM | POA: Diagnosis not present

## 2021-05-13 HISTORY — PX: ESOPHAGOGASTRODUODENOSCOPY: SHX5428

## 2021-05-13 LAB — POCT I-STAT, CHEM 8
BUN: 7 mg/dL (ref 6–20)
Calcium, Ion: 1.1 mmol/L — ABNORMAL LOW (ref 1.15–1.40)
Chloride: 94 mmol/L — ABNORMAL LOW (ref 98–111)
Creatinine, Ser: 0.9 mg/dL (ref 0.61–1.24)
Glucose, Bld: 139 mg/dL — ABNORMAL HIGH (ref 70–99)
HCT: 38 % — ABNORMAL LOW (ref 39.0–52.0)
Hemoglobin: 12.9 g/dL — ABNORMAL LOW (ref 13.0–17.0)
Potassium: 3.4 mmol/L — ABNORMAL LOW (ref 3.5–5.1)
Sodium: 136 mmol/L (ref 135–145)
TCO2: 30 mmol/L (ref 22–32)

## 2021-05-13 SURGERY — EGD (ESOPHAGOGASTRODUODENOSCOPY)
Anesthesia: Monitor Anesthesia Care

## 2021-05-13 MED ORDER — MIDAZOLAM HCL 2 MG/2ML IJ SOLN
INTRAMUSCULAR | Status: AC
  Filled 2021-05-13: qty 2

## 2021-05-13 MED ORDER — MIDAZOLAM HCL 2 MG/2ML IJ SOLN
INTRAMUSCULAR | Status: DC | PRN
Start: 2021-05-13 — End: 2021-05-13
  Administered 2021-05-13: 2 mg via INTRAVENOUS

## 2021-05-13 MED ORDER — PROPOFOL 10 MG/ML IV BOLUS
INTRAVENOUS | Status: DC | PRN
  Administered 2021-05-13 (×2): 20 mg via INTRAVENOUS
  Administered 2021-05-13: 30 mg via INTRAVENOUS

## 2021-05-13 MED ORDER — PROPOFOL 500 MG/50ML IV EMUL
INTRAVENOUS | Status: DC | PRN
  Administered 2021-05-13: 150 ug/kg/min via INTRAVENOUS

## 2021-05-13 MED ORDER — PROPOFOL 500 MG/50ML IV EMUL
INTRAVENOUS | Status: AC
  Filled 2021-05-13: qty 50

## 2021-05-13 MED ORDER — SODIUM CHLORIDE 0.9 % IV SOLN: INTRAVENOUS | Status: DC

## 2021-05-13 MED ORDER — LACTATED RINGERS IV SOLN: INTRAVENOUS | Status: DC | PRN

## 2021-05-13 MED ORDER — LIDOCAINE 2% (20 MG/ML) 5 ML SYRINGE
INTRAMUSCULAR | Status: DC | PRN
  Administered 2021-05-13: 60 mg via INTRAVENOUS

## 2021-05-13 NOTE — Interval H&P Note (Signed)
History and Physical Interval Note: ?56/male with esophageal varices for EGD and banding with propofol. ? ?05/13/2021 ?7:36 AM ? ?Sammuel Cooper  has presented today for EGD with banding, with the diagnosis of Alcoholic cirrhosis of liver.  The various methods of treatment have been discussed with the patient and family. After consideration of risks, benefits and other options for treatment, the patient has consented to  Procedure(s): ?ESOPHAGOGASTRODUODENOSCOPY (EGD) (N/A) ?GASTRIC VARICES BANDING (N/A) as a surgical intervention.  The patient's history has been reviewed, patient examined, no change in status, stable for surgery.  I have reviewed the patient's chart and labs.  Questions were answered to the patient's satisfaction.   ? ? ?Ronnette Juniper ? ? ?

## 2021-05-13 NOTE — Op Note (Signed)
[facility] ?Patient Name: Eric Dennis ?Procedure Date: 05/13/2021 ?MRN: [id number] ?Attending MD: Ronnette Juniper , MD ?Date of Birth: [birth year] ?CSN: [id number] ?Age: 56 ?Admit Type: Inpatient ?Procedure:                Upper GI endoscopy ?Indications:              Follow-up of esophageal varices ?Providers:                Ronnette Juniper, MD, Carlyn Reichert, RN, Despina Pole,  ?                          Technician, Tyna Jaksch Technician, [facility] [place] ?Referring MD:             Raina Mina, PA ?Medicines:                Monitored Anesthesia Care ?Complications:            No immediate complications. Estimated blood loss:  ?                          None. ?Estimated Blood Loss:     Estimated blood loss: none. ?Procedure:                Pre-Anesthesia Assessment: ?                          - Prior to the procedure, a History and Physical  ?                          was performed, and patient medications and  ?                          allergies were reviewed. The patient's tolerance of  ?                          previous anesthesia was also reviewed. The risks  ?                          and benefits of the procedure and the sedation  ?                          options and risks were discussed with the patient.  ?                          All questions were answered, and informed consent  ?                          was obtained. Prior Anticoagulants: The patient has  ?                          taken no previous anticoagulant or antiplatelet  ?                          agents. ASA Grade Assessment: III - A patient with  ?  severe systemic disease. After reviewing the risks  ?                          and benefits, the patient was deemed in  ?                          satisfactory condition to undergo the procedure. ?                          After obtaining informed consent, the endoscope was  ?                          passed under direct vision. Throughout the  ?                           procedure, the patient's blood pressure, pulse, and  ?                          oxygen saturations were monitored continuously. The  ?                          [id number]-[id number] ([id number]) Olympus endoscope was introduced  ?                          through the mouth, and advanced to the second part  ?                          of duodenum. The upper GI endoscopy was  ?                          accomplished without difficulty. The patient  ?                          tolerated the procedure well. ?Scope In: ?Scope Out: ?Findings: ?     Grade I varices were found in the lower third of the esophagus. They  ?     were diminutive in size. ?     The Z-line was regular and was found 40 cm from the incisors. ?     Severe, diffuse portal hypertensive gastropathy was found in the entire  ?     examined stomach. ?     Few non-bleeding superficial gastric ulcers with a clean ulcer base  ?     (Forrest Class III) were found in the gastric antrum. ?     The cardia and gastric fundus were normal on retroflexion. There were no  ?     gastric varices noted. ?     Few non-bleeding superficial duodenal ulcers with a clean ulcer base  ?     (Forrest Class III) were found in the duodenal bulb and in the first  ?     portion of the duodenum. ?Impression:               - Grade I esophageal varices. ?                          - Z-line regular, 40 cm from the  incisors. ?                          - Portal hypertensive gastropathy. ?                          - Non-bleeding gastric ulcers with a clean ulcer  ?                          base (Forrest Class III). ?                          - Non-bleeding duodenal ulcers with a clean ulcer  ?                          base (Forrest Class III). ?                          - No specimens collected. ?Moderate Sedation: ?     Patient did not receive moderate sedation for this procedure, but  ?     instead received monitored anesthesia care. ?Recommendation:           - Resume regular diet. ?                           - Continue present medications. ?                          - Repeat upper endoscopy in 1 year for surveillance. ?                          - Continue Pantoprazole once a day indefinitely. ?Procedure Code(s):        --- Professional --- ?                          [phone], Esophagogastroduodenoscopy, flexible,  ?                          transoral; diagnostic, including collection of  ?                          specimen(s) by brushing or washing, when performed  ?                          (separate procedure) ?Diagnosis Code(s):        --- Professional --- ?                          I85.00, Esophageal varices without bleeding ?                          K76.6, Portal hypertension ?                          K31.89, Other diseases of stomach and duodenum ?  K25.9, Gastric ulcer, unspecified as acute or  ?                          chronic, without hemorrhage or perforation ?                          K26.9, Duodenal ulcer, unspecified as acute or  ?                          chronic, without hemorrhage or perforation ?CPT copyright 2019 [employer]. All rights reserved. ?The codes documented in this report are preliminary and upon coder review may  ?be revised to meet current compliance requirements. ?Ronnette Juniper, MD ?05/13/2021 8:26:10 AM ?This report has been signed electronically. ?Number of Addenda: 0 ?

## 2021-05-13 NOTE — Discharge Instructions (Signed)

## 2021-05-13 NOTE — Transfer of Care (Signed)
Immediate Anesthesia Transfer of Care Note ? ?Patient: Eric Dennis ? ?Procedure(s) Performed: ESOPHAGOGASTRODUODENOSCOPY (EGD) ? ?Patient Location: Endoscopy Unit ? ?Anesthesia Type:MAC ? ?Level of Consciousness: drowsy ? ?Airway & Oxygen Therapy: Patient Spontanous Breathing and Patient connected to face mask ? ?Post-op Assessment: Report given to RN and Post -op Vital signs reviewed and stable ? ?Post vital signs: Reviewed and stable ? ?Last Vitals:  ?Vitals Value Taken Time  ?BP 119/66 05/13/21 0825  ?Temp    ?Pulse 81 05/13/21 0829  ?Resp 17 05/13/21 0829  ?SpO2 100 % 05/13/21 0829  ?Vitals shown include unvalidated device data. ? ?Last Pain:  ?Vitals:  ? 05/13/21 0731  ?TempSrc: Temporal  ?PainSc: 0-No pain  ?   ? ?  ? ?Complications: No notable events documented. ?

## 2021-05-13 NOTE — Anesthesia Postprocedure Evaluation (Signed)
Anesthesia Post Note ? ?Patient: Eric Dennis ? ?Procedure(s) Performed: ESOPHAGOGASTRODUODENOSCOPY (EGD) ? ?  ? ?Patient location during evaluation: Endoscopy ?Anesthesia Type: MAC ?Level of consciousness: awake and alert ?Pain management: pain level controlled ?Vital Signs Assessment: post-procedure vital signs reviewed and stable ?Respiratory status: spontaneous breathing, nonlabored ventilation, respiratory function stable and patient connected to nasal cannula oxygen ?Cardiovascular status: blood pressure returned to baseline and stable ?Postop Assessment: no apparent nausea or vomiting ?Anesthetic complications: no ? ? ?No notable events documented. ? ?Last Vitals:  ?Vitals:  ? 05/13/21 0840 05/13/21 0850  ?BP: 121/74 137/81  ?Pulse: 86 81  ?Resp: 18 14  ?Temp:    ?SpO2: 100% 95%  ?  ?Last Pain:  ?Vitals:  ? 05/13/21 0850  ?TempSrc:   ?PainSc: 0-No pain  ? ? ?  ?  ?  ?  ?  ?  ? ?Brylie Sneath L Early [employer] ? ? ? ? ?

## 2021-05-15 IMAGING — US US BREAST BX W LOC DEV 1ST LESION IMG BX SPEC US GUIDE*L*
1 series · 11 of 11 positions shown · non-contrast
Comparison: Previous exam(s).
COMPARISON: Previous exam(s).

Addendum:
CLINICAL DATA: 55-year-old male presenting for ultrasound-guided
biopsy of a retroareolar left breast mass.

EXAM:
ULTRASOUND GUIDED LEFT BREAST CORE NEEDLE BIOPSY

[Series 1: us breast bx w loc dev 1st lesion img bx spec us g · 0.07mm/px · 11 of 11 slices shown]
[im 1/11]
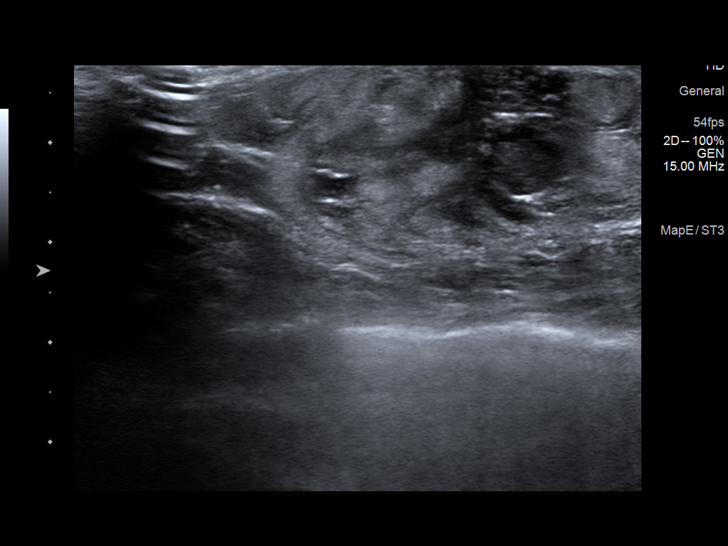
[im 2/11]
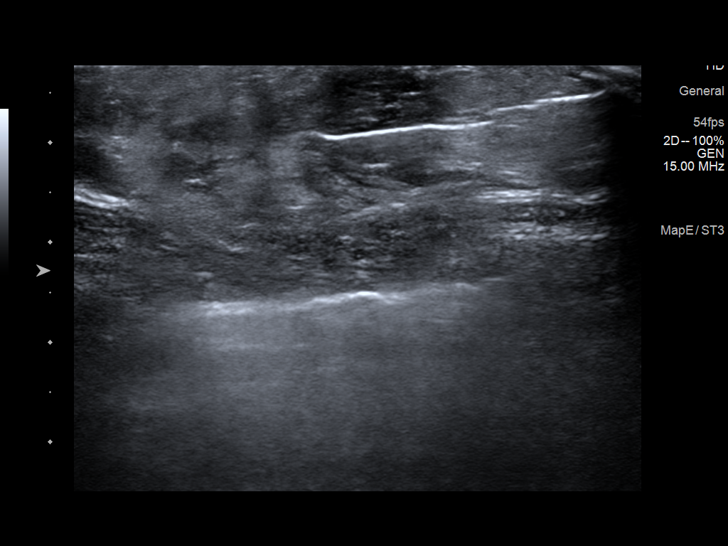
[im 3/11]
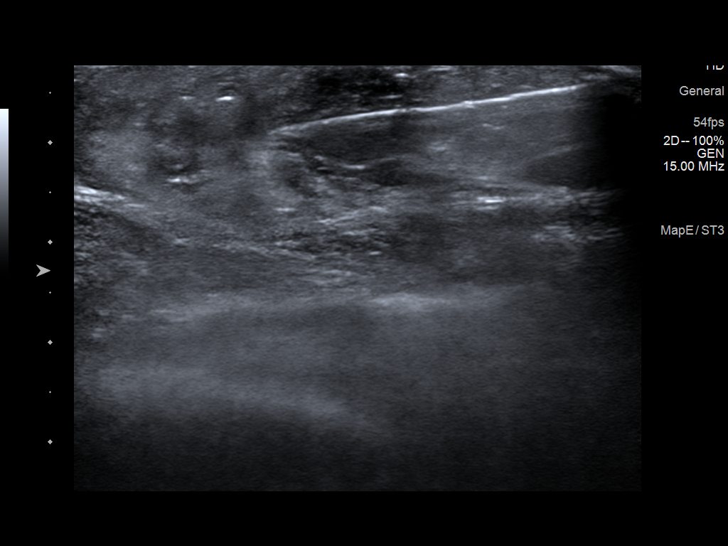
[im 4/11]
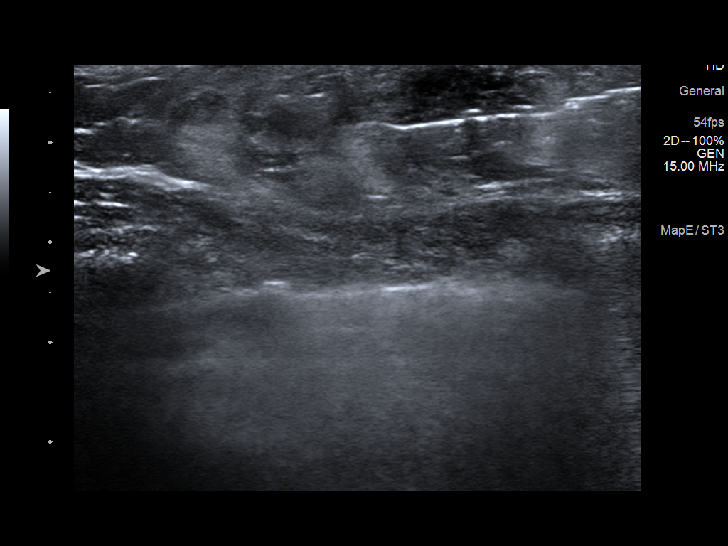
[im 5/11]
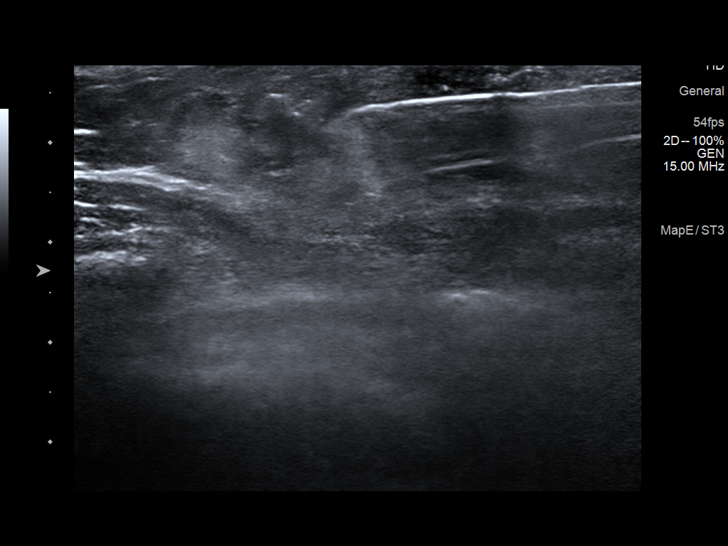
[im 6/11]
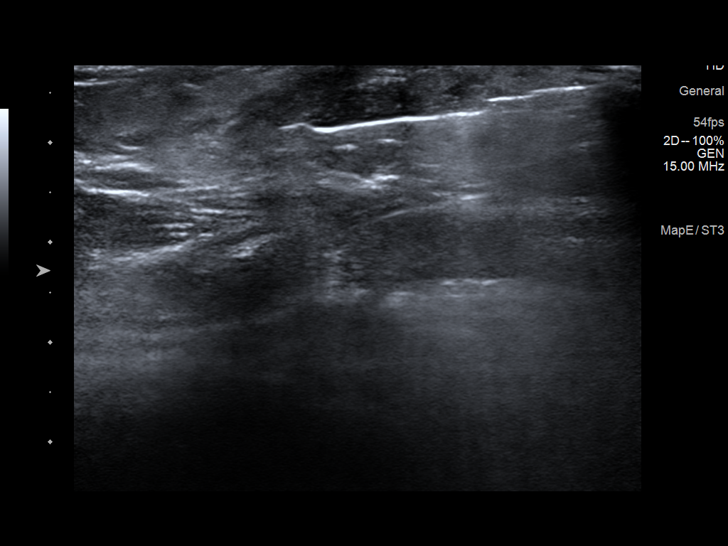
[im 7/11]
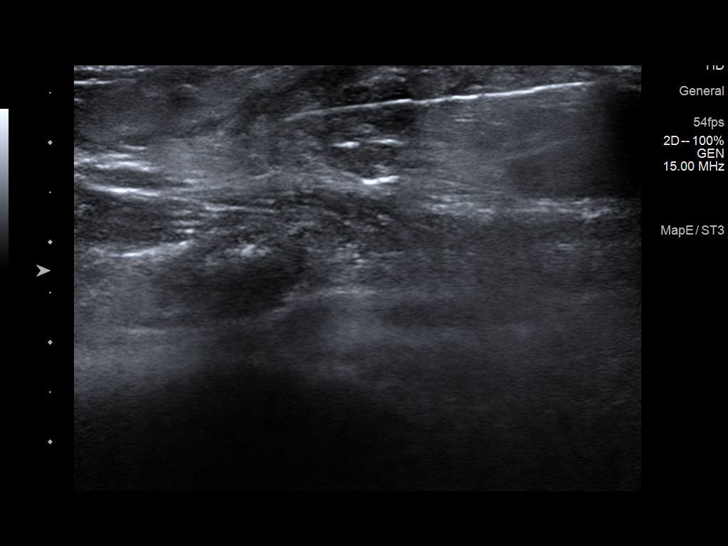
[im 8/11]
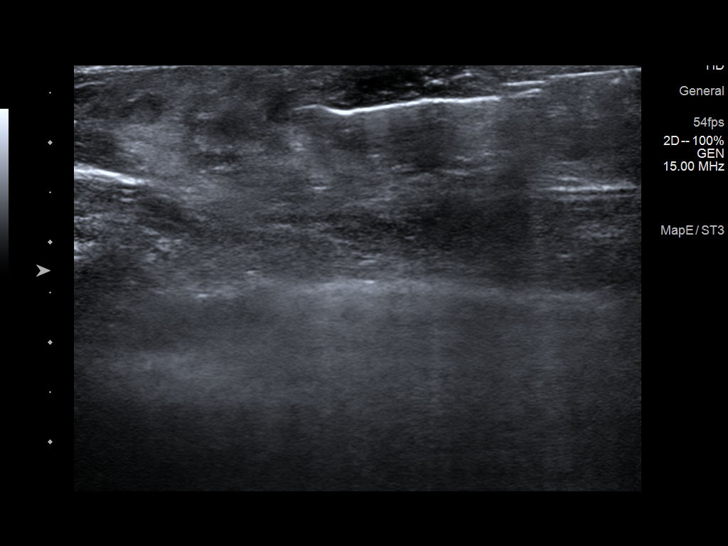
[im 9/11]
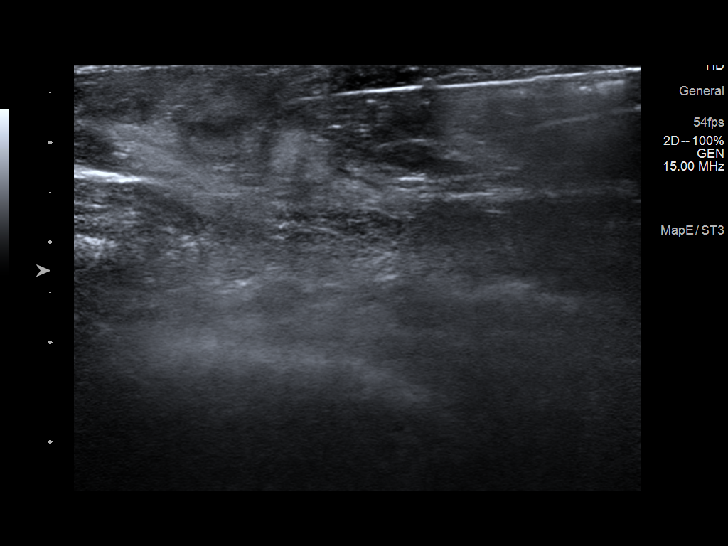
[im 10/11]
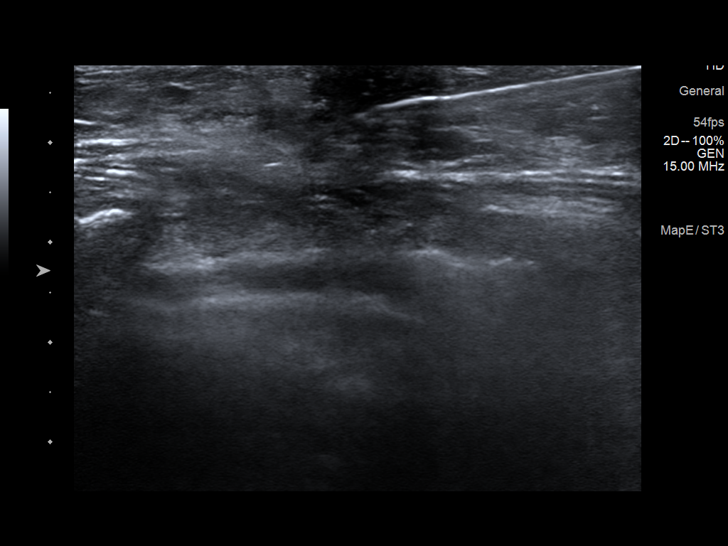
[im 11/11]
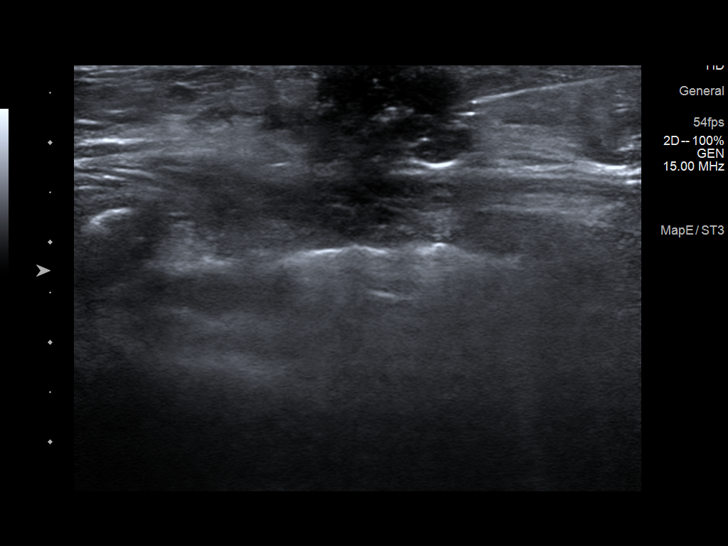

[11 of 11 positions shown; findings below may reference images not displayed]



Lesion quadrant: Upper outer quadrant

Using sterile technique and 1% Lidocaine as local anesthetic, under
direct ultrasound visualization, a 14 gauge Maisho device was
used to perform biopsy of a mass in the retroareolar left breast
using a lateral approach. At the conclusion of the procedure a coil
shaped tissue marker clip was deployed into the biopsy cavity.
Follow up 2 view mammogram was performed and dictated separately.
IMPRESSION: Ultrasound guided biopsy of a retroareolar left breast mass. No
apparent complications.

ADDENDUM:
Pathology revealed FIBROCYSTIC CHANGES WITH CALCIFICATIONS. NO
EVIDENCE OF MALIGNANCY of the LEFT breast, retroareolar mass. This
was found to be discordant by Dr. Lapatsa Kobo, with excision
recommended due to history of left nipple discharge.

Pathology results were discussed with the patient by telephone. The
patient reported doing well after the biopsy with tenderness at the
site. Post biopsy instructions and care were reviewed and questions
were answered. The patient was encouraged to call The [REDACTED]

Surgical consultation has been arranged with Dr. Latinho Okito at
[REDACTED] on June 10, 2020.

Pathology results reported by Neesy Ya Neesy Heydenrych RN on 05/14/2020.



Lesion quadrant: Upper outer quadrant

Using sterile technique and 1% Lidocaine as local anesthetic, under
direct ultrasound visualization, a 14 gauge Maisho device was
used to perform biopsy of a mass in the retroareolar left breast
using a lateral approach. At the conclusion of the procedure a coil
shaped tissue marker clip was deployed into the biopsy cavity.
Follow up 2 view mammogram was performed and dictated separately.
IMPRESSION: Ultrasound guided biopsy of a retroareolar left breast mass. No
apparent complications.

## 2021-06-08 ENCOUNTER — Inpatient Hospital Stay (HOSPITAL_COMMUNITY): Payer: Medicaid Other

## 2021-06-08 ENCOUNTER — Emergency Department (HOSPITAL_COMMUNITY): Payer: Medicaid Other

## 2021-06-08 ENCOUNTER — Inpatient Hospital Stay (HOSPITAL_COMMUNITY)
Admission: EM | Admit: 2021-06-08 | Discharge: 2021-06-17 | DRG: 981 | Disposition: A | Discharge status: Rehabilitation Facility | Payer: Medicaid Other | Attending: Internal Medicine | Admitting: Internal Medicine

## 2021-06-08 DIAGNOSIS — F419 Anxiety disorder, unspecified: Secondary | ICD-10-CM | POA: Diagnosis present

## 2021-06-08 DIAGNOSIS — R008 Other abnormalities of heart beat: Secondary | ICD-10-CM | POA: Diagnosis present

## 2021-06-08 DIAGNOSIS — R627 Adult failure to thrive: Secondary | ICD-10-CM | POA: Diagnosis present

## 2021-06-08 DIAGNOSIS — D61818 Other pancytopenia: Secondary | ICD-10-CM | POA: Diagnosis present

## 2021-06-08 DIAGNOSIS — K7030 Alcoholic cirrhosis of liver without ascites: Secondary | ICD-10-CM | POA: Diagnosis present

## 2021-06-08 DIAGNOSIS — R7989 Other specified abnormal findings of blood chemistry: Secondary | ICD-10-CM

## 2021-06-08 DIAGNOSIS — Z20822 Contact with and (suspected) exposure to covid-19: Secondary | ICD-10-CM | POA: Diagnosis present

## 2021-06-08 DIAGNOSIS — D684 Acquired coagulation factor deficiency: Secondary | ICD-10-CM | POA: Diagnosis present

## 2021-06-08 DIAGNOSIS — R739 Hyperglycemia, unspecified: Secondary | ICD-10-CM | POA: Diagnosis not present

## 2021-06-08 DIAGNOSIS — K7040 Alcoholic hepatic failure without coma: Secondary | ICD-10-CM | POA: Diagnosis present

## 2021-06-08 DIAGNOSIS — E440 Moderate protein-calorie malnutrition: Secondary | ICD-10-CM | POA: Diagnosis present

## 2021-06-08 DIAGNOSIS — J9690 Respiratory failure, unspecified, unspecified whether with hypoxia or hypercapnia: Secondary | ICD-10-CM

## 2021-06-08 DIAGNOSIS — K7682 Hepatic encephalopathy: Secondary | ICD-10-CM | POA: Diagnosis present

## 2021-06-08 DIAGNOSIS — K7010 Alcoholic hepatitis without ascites: Secondary | ICD-10-CM | POA: Diagnosis present

## 2021-06-08 DIAGNOSIS — Z6823 Body mass index (BMI) 23.0-23.9, adult: Secondary | ICD-10-CM | POA: Diagnosis not present

## 2021-06-08 DIAGNOSIS — Y92239 Unspecified place in hospital as the place of occurrence of the external cause: Secondary | ICD-10-CM | POA: Diagnosis not present

## 2021-06-08 DIAGNOSIS — T380X5A Adverse effect of glucocorticoids and synthetic analogues, initial encounter: Secondary | ICD-10-CM | POA: Diagnosis not present

## 2021-06-08 DIAGNOSIS — F17210 Nicotine dependence, cigarettes, uncomplicated: Secondary | ICD-10-CM | POA: Diagnosis present

## 2021-06-08 DIAGNOSIS — K766 Portal hypertension: Secondary | ICD-10-CM | POA: Diagnosis present

## 2021-06-08 DIAGNOSIS — Z515 Encounter for palliative care: Secondary | ICD-10-CM

## 2021-06-08 DIAGNOSIS — Z8249 Family history of ischemic heart disease and other diseases of the circulatory system: Secondary | ICD-10-CM

## 2021-06-08 DIAGNOSIS — K922 Gastrointestinal hemorrhage, unspecified: Secondary | ICD-10-CM

## 2021-06-08 DIAGNOSIS — E871 Hypo-osmolality and hyponatremia: Secondary | ICD-10-CM | POA: Diagnosis not present

## 2021-06-08 DIAGNOSIS — X58XXXA Exposure to other specified factors, initial encounter: Secondary | ICD-10-CM | POA: Diagnosis present

## 2021-06-08 DIAGNOSIS — L89026 Pressure-induced deep tissue damage of left elbow: Secondary | ICD-10-CM | POA: Diagnosis present

## 2021-06-08 DIAGNOSIS — N179 Acute kidney failure, unspecified: Secondary | ICD-10-CM

## 2021-06-08 DIAGNOSIS — T17298A Other foreign object in pharynx causing other injury, initial encounter: Secondary | ICD-10-CM | POA: Diagnosis present

## 2021-06-08 DIAGNOSIS — R571 Hypovolemic shock: Secondary | ICD-10-CM | POA: Diagnosis present

## 2021-06-08 DIAGNOSIS — K7290 Hepatic failure, unspecified without coma: Secondary | ICD-10-CM | POA: Diagnosis not present

## 2021-06-08 DIAGNOSIS — E873 Alkalosis: Secondary | ICD-10-CM | POA: Diagnosis not present

## 2021-06-08 DIAGNOSIS — Z833 Family history of diabetes mellitus: Secondary | ICD-10-CM

## 2021-06-08 DIAGNOSIS — K7460 Unspecified cirrhosis of liver: Secondary | ICD-10-CM

## 2021-06-08 DIAGNOSIS — D72829 Elevated white blood cell count, unspecified: Secondary | ICD-10-CM | POA: Diagnosis not present

## 2021-06-08 DIAGNOSIS — D62 Acute posthemorrhagic anemia: Secondary | ICD-10-CM | POA: Diagnosis present

## 2021-06-08 DIAGNOSIS — Z7189 Other specified counseling: Secondary | ICD-10-CM | POA: Diagnosis not present

## 2021-06-08 DIAGNOSIS — E870 Hyperosmolality and hypernatremia: Secondary | ICD-10-CM | POA: Diagnosis present

## 2021-06-08 DIAGNOSIS — Z79899 Other long term (current) drug therapy: Secondary | ICD-10-CM

## 2021-06-08 DIAGNOSIS — D689 Coagulation defect, unspecified: Secondary | ICD-10-CM | POA: Diagnosis not present

## 2021-06-08 DIAGNOSIS — N170 Acute kidney failure with tubular necrosis: Secondary | ICD-10-CM | POA: Diagnosis present

## 2021-06-08 DIAGNOSIS — F1020 Alcohol dependence, uncomplicated: Secondary | ICD-10-CM | POA: Diagnosis present

## 2021-06-08 DIAGNOSIS — D6489 Other specified anemias: Secondary | ICD-10-CM | POA: Diagnosis present

## 2021-06-08 DIAGNOSIS — K8020 Calculus of gallbladder without cholecystitis without obstruction: Secondary | ICD-10-CM | POA: Diagnosis present

## 2021-06-08 DIAGNOSIS — K920 Hematemesis: Secondary | ICD-10-CM | POA: Diagnosis present

## 2021-06-08 DIAGNOSIS — I959 Hypotension, unspecified: Secondary | ICD-10-CM

## 2021-06-08 DIAGNOSIS — J9601 Acute respiratory failure with hypoxia: Secondary | ICD-10-CM | POA: Diagnosis present

## 2021-06-08 DIAGNOSIS — R5381 Other malaise: Secondary | ICD-10-CM | POA: Diagnosis not present

## 2021-06-08 DIAGNOSIS — D638 Anemia in other chronic diseases classified elsewhere: Secondary | ICD-10-CM | POA: Diagnosis not present

## 2021-06-08 LAB — CBC
HCT: 20.8 % — ABNORMAL LOW (ref 39.0–52.0)
Hemoglobin: 7.6 g/dL — ABNORMAL LOW (ref 13.0–17.0)
MCH: 37.6 pg — ABNORMAL HIGH (ref 26.0–34.0)
MCHC: 36.5 g/dL — ABNORMAL HIGH (ref 30.0–36.0)
MCV: 103 fL — ABNORMAL HIGH (ref 80.0–100.0)
Platelets: 60 10*3/uL — ABNORMAL LOW (ref 150–400)
RBC: 2.02 MIL/uL — ABNORMAL LOW (ref 4.22–5.81)
WBC: 9.4 10*3/uL (ref 4.0–10.5)
nRBC: 1.5 % — ABNORMAL HIGH (ref 0.0–0.2)

## 2021-06-08 LAB — CBC WITH DIFFERENTIAL/PLATELET
Abs Immature Granulocytes: 0.06 10*3/uL (ref 0.00–0.07)
Basophils Absolute: 0 10*3/uL (ref 0.0–0.1)
Basophils Relative: 0 %
Eosinophils Absolute: 0 10*3/uL (ref 0.0–0.5)
Eosinophils Relative: 0 %
HCT: 22.8 % — ABNORMAL LOW (ref 39.0–52.0)
Hemoglobin: 7.9 g/dL — ABNORMAL LOW (ref 13.0–17.0)
Immature Granulocytes: 1 %
Lymphocytes Relative: 9 %
Lymphs Abs: 1 10*3/uL (ref 0.7–4.0)
MCH: 42.5 pg — ABNORMAL HIGH (ref 26.0–34.0)
MCHC: 34.6 g/dL (ref 30.0–36.0)
MCV: 122.6 fL — ABNORMAL HIGH (ref 80.0–100.0)
Monocytes Absolute: 1.5 10*3/uL — ABNORMAL HIGH (ref 0.1–1.0)
Monocytes Relative: 13 %
Neutro Abs: 8.6 10*3/uL — ABNORMAL HIGH (ref 1.7–7.7)
Neutrophils Relative %: 77 %
Platelets: 118 10*3/uL — ABNORMAL LOW (ref 150–400)
RBC: 1.86 MIL/uL — ABNORMAL LOW (ref 4.22–5.81)
RDW: 14.5 % (ref 11.5–15.5)
Smear Review: DECREASED
WBC: 11.2 10*3/uL — ABNORMAL HIGH (ref 4.0–10.5)
nRBC: 0.2 % (ref 0.0–0.2)

## 2021-06-08 LAB — COMPREHENSIVE METABOLIC PANEL
ALT: 48 U/L — ABNORMAL HIGH (ref 0–44)
ALT: 40 U/L (ref 0–44)
AST: 102 U/L — ABNORMAL HIGH (ref 15–41)
AST: 83 U/L — ABNORMAL HIGH (ref 15–41)
Albumin: 2.3 g/dL — ABNORMAL LOW (ref 3.5–5.0)
Albumin: 1.9 g/dL — ABNORMAL LOW (ref 3.5–5.0)
Alkaline Phosphatase: 86 U/L (ref 38–126)
Alkaline Phosphatase: 72 U/L (ref 38–126)
Anion gap: 19 — ABNORMAL HIGH (ref 5–15)
Anion gap: 19 — ABNORMAL HIGH (ref 5–15)
BUN: 63 mg/dL — ABNORMAL HIGH (ref 6–20)
BUN: 76 mg/dL — ABNORMAL HIGH (ref 6–20)
CO2: 30 mmol/L (ref 22–32)
CO2: 25 mmol/L (ref 22–32)
Calcium: 10.4 mg/dL — ABNORMAL HIGH (ref 8.9–10.3)
Calcium: 9.6 mg/dL (ref 8.9–10.3)
Chloride: 90 mmol/L — ABNORMAL LOW (ref 98–111)
Chloride: 94 mmol/L — ABNORMAL LOW (ref 98–111)
Creatinine, Ser: 1.55 mg/dL — ABNORMAL HIGH (ref 0.61–1.24)
Creatinine, Ser: 2.2 mg/dL — ABNORMAL HIGH (ref 0.61–1.24)
GFR, Estimated: 52 mL/min — ABNORMAL LOW (ref >60)
GFR, Estimated: 34 mL/min — ABNORMAL LOW (ref >60)
Glucose, Bld: 155 mg/dL — ABNORMAL HIGH (ref 70–99)
Glucose, Bld: 144 mg/dL — ABNORMAL HIGH (ref 70–99)
Potassium: 3.5 mmol/L (ref 3.5–5.1)
Potassium: 3.9 mmol/L (ref 3.5–5.1)
Sodium: 139 mmol/L (ref 135–145)
Sodium: 138 mmol/L (ref 135–145)
Total Bilirubin: 19 mg/dL (ref 0.3–1.2)
Total Bilirubin: 16.6 mg/dL — ABNORMAL HIGH (ref 0.3–1.2)
Total Protein: 5.6 g/dL — ABNORMAL LOW (ref 6.5–8.1)
Total Protein: 4.9 g/dL — ABNORMAL LOW (ref 6.5–8.1)

## 2021-06-08 LAB — POCT I-STAT 7, (LYTES, BLD GAS, ICA,H+H)
Acid-Base Excess: 5 mmol/L — ABNORMAL HIGH (ref 0.0–2.0)
Bicarbonate: 27.5 mmol/L (ref 20.0–28.0)
Calcium, Ion: 1.05 mmol/L — ABNORMAL LOW (ref 1.15–1.40)
HCT: 16 % — ABNORMAL LOW (ref 39.0–52.0)
Hemoglobin: 5.4 g/dL — CL (ref 13.0–17.0)
O2 Saturation: 100 %
Patient temperature: 97.9
Potassium: 4.4 mmol/L (ref 3.5–5.1)
Sodium: 135 mmol/L (ref 135–145)
TCO2: 28 mmol/L (ref 22–32)
pCO2 arterial: 31.4 mmHg — ABNORMAL LOW (ref 32–48)
pH, Arterial: 7.55 — ABNORMAL HIGH (ref 7.35–7.45)
pO2, Arterial: 443 mmHg — ABNORMAL HIGH (ref 83–108)

## 2021-06-08 LAB — HEMOGLOBIN AND HEMATOCRIT, BLOOD
HCT: 16.5 % — ABNORMAL LOW (ref 39.0–52.0)
Hemoglobin: 5.7 g/dL — CL (ref 13.0–17.0)

## 2021-06-08 LAB — AMMONIA: Ammonia: 112 umol/L — ABNORMAL HIGH (ref 9–35)

## 2021-06-08 LAB — URINALYSIS, ROUTINE W REFLEX MICROSCOPIC
Bacteria, UA: NONE SEEN
Glucose, UA: NEGATIVE mg/dL
Ketones, ur: NEGATIVE mg/dL
Nitrite: NEGATIVE
Protein, ur: 30 mg/dL — AB
Specific Gravity, Urine: 1.018 (ref 1.005–1.030)
pH: 5 (ref 5.0–8.0)

## 2021-06-08 LAB — GLUCOSE, CAPILLARY
Glucose-Capillary: 109 mg/dL — ABNORMAL HIGH (ref 70–99)
Glucose-Capillary: 129 mg/dL — ABNORMAL HIGH (ref 70–99)
Glucose-Capillary: 183 mg/dL — ABNORMAL HIGH (ref 70–99)

## 2021-06-08 LAB — PROTIME-INR
INR: 2.4 — ABNORMAL HIGH (ref 0.8–1.2)
Prothrombin Time: 25.9 seconds — ABNORMAL HIGH (ref 11.4–15.2)

## 2021-06-08 LAB — TROPONIN I (HIGH SENSITIVITY)
Troponin I (High Sensitivity): 67 ng/L — ABNORMAL HIGH (ref <18)
Troponin I (High Sensitivity): 77 ng/L — ABNORMAL HIGH (ref <18)

## 2021-06-08 LAB — LIPASE, BLOOD: Lipase: 50 U/L (ref 11–51)

## 2021-06-08 LAB — MAGNESIUM: Magnesium: 1.4 mg/dL — ABNORMAL LOW (ref 1.7–2.4)

## 2021-06-08 LAB — PREPARE RBC (CROSSMATCH)

## 2021-06-08 LAB — MRSA NEXT GEN BY PCR, NASAL: MRSA by PCR Next Gen: NOT DETECTED

## 2021-06-08 MED ORDER — MIDAZOLAM HCL 2 MG/2ML IJ SOLN
2.0000 mg | INTRAMUSCULAR | Status: DC | PRN
  Administered 2021-06-09 – 2021-06-11 (×3): 2 mg via INTRAVENOUS
  Filled 2021-06-08 (×2): qty 2

## 2021-06-08 MED ORDER — THIAMINE HCL 100 MG/ML IJ SOLN
100.0000 mg | Freq: Every day | INTRAMUSCULAR | Status: DC
  Administered 2021-06-08 – 2021-06-10 (×3): 100 mg via INTRAVENOUS
  Filled 2021-06-08 (×3): qty 2

## 2021-06-08 MED ORDER — SODIUM CHLORIDE 0.9 % IV SOLN
1.0000 g | INTRAVENOUS | Status: AC
  Administered 2021-06-08 – 2021-06-14 (×7): 1 g via INTRAVENOUS
  Filled 2021-06-08 (×7): qty 10

## 2021-06-08 MED ORDER — DOCUSATE SODIUM 100 MG PO CAPS: 100.0000 mg | ORAL_CAPSULE | Freq: Two times a day (BID) | ORAL | Status: DC | PRN

## 2021-06-08 MED ORDER — ETOMIDATE 2 MG/ML IV SOLN
INTRAVENOUS | Status: AC
  Administered 2021-06-08: 20 mg
  Filled 2021-06-08: qty 10

## 2021-06-08 MED ORDER — MIDAZOLAM HCL 2 MG/2ML IJ SOLN
2.0000 mg | INTRAMUSCULAR | Status: DC | PRN
  Administered 2021-06-09 – 2021-06-10 (×2): 2 mg via INTRAVENOUS
  Filled 2021-06-08 (×3): qty 2

## 2021-06-08 MED ORDER — OCTREOTIDE LOAD VIA INFUSION
50.0000 ug | Freq: Once | INTRAVENOUS | Status: AC
  Administered 2021-06-08: 50 ug via INTRAVENOUS
  Filled 2021-06-08: qty 25

## 2021-06-08 MED ORDER — ORAL CARE MOUTH RINSE
15.0000 mL | OROMUCOSAL | Status: DC
  Administered 2021-06-08 – 2021-06-11 (×28): 15 mL via OROMUCOSAL

## 2021-06-08 MED ORDER — LACTATED RINGERS IV BOLUS
1000.0000 mL | Freq: Once | INTRAVENOUS | Status: AC
  Administered 2021-06-08: 1000 mL via INTRAVENOUS

## 2021-06-08 MED ORDER — SODIUM CHLORIDE 0.9% IV SOLUTION: Freq: Once | INTRAVENOUS | Status: AC

## 2021-06-08 MED ORDER — FENTANYL CITRATE (PF) 100 MCG/2ML IJ SOLN
50.0000 ug | INTRAMUSCULAR | Status: DC | PRN
  Administered 2021-06-09 – 2021-06-11 (×4): 100 ug via INTRAVENOUS
  Filled 2021-06-08 (×4): qty 2

## 2021-06-08 MED ORDER — FENTANYL CITRATE (PF) 100 MCG/2ML IJ SOLN
INTRAMUSCULAR | Status: AC
  Administered 2021-06-08: 100 ug
  Filled 2021-06-08: qty 2

## 2021-06-08 MED ORDER — NOREPINEPHRINE 4 MG/250ML-% IV SOLN
0.0000 ug/min | INTRAVENOUS | Status: DC
  Administered 2021-06-08: 10 ug/min via INTRAVENOUS
  Filled 2021-06-08: qty 250

## 2021-06-08 MED ORDER — ROCURONIUM BROMIDE 10 MG/ML (PF) SYRINGE
PREFILLED_SYRINGE | INTRAVENOUS | Status: AC
  Administered 2021-06-08: 100 mg
  Filled 2021-06-08: qty 10

## 2021-06-08 MED ORDER — POTASSIUM CHLORIDE 10 MEQ/100ML IV SOLN
10.0000 meq | INTRAVENOUS | Status: AC
  Administered 2021-06-08 (×4): 10 meq via INTRAVENOUS
  Filled 2021-06-08 (×4): qty 100

## 2021-06-08 MED ORDER — PANTOPRAZOLE SODIUM 40 MG IV SOLR: 40.0000 mg | Freq: Two times a day (BID) | INTRAVENOUS | Status: DC

## 2021-06-08 MED ORDER — CHLORHEXIDINE GLUCONATE 0.12% ORAL RINSE (MEDLINE KIT)
15.0000 mL | Freq: Two times a day (BID) | OROMUCOSAL | Status: DC
  Administered 2021-06-08 – 2021-06-17 (×15): 15 mL via OROMUCOSAL

## 2021-06-08 MED ORDER — ALBUMIN HUMAN 5 % IV SOLN
25.0000 g | Freq: Four times a day (QID) | INTRAVENOUS | Status: DC
  Administered 2021-06-08: 12.5 g via INTRAVENOUS
  Filled 2021-06-08: qty 500

## 2021-06-08 MED ORDER — PHENYLEPHRINE 40 MCG/ML (10ML) SYRINGE FOR IV PUSH (FOR BLOOD PRESSURE SUPPORT)
PREFILLED_SYRINGE | INTRAVENOUS | Status: AC
  Administered 2021-06-08: 400 ug
  Filled 2021-06-08: qty 10

## 2021-06-08 MED ORDER — PANTOPRAZOLE 80MG IVPB - SIMPLE MED
80.0000 mg | Freq: Once | INTRAVENOUS | Status: AC
  Administered 2021-06-08: 80 mg via INTRAVENOUS
  Filled 2021-06-08: qty 80

## 2021-06-08 MED ORDER — POLYETHYLENE GLYCOL 3350 17 G PO PACK: 17.0000 g | PACK | Freq: Every day | ORAL | Status: DC | PRN

## 2021-06-08 MED ORDER — MIDAZOLAM HCL 2 MG/2ML IJ SOLN
INTRAMUSCULAR | Status: AC
  Administered 2021-06-08: 2 mg
  Filled 2021-06-08: qty 2

## 2021-06-08 MED ORDER — SODIUM CHLORIDE 0.9 % IV SOLN
50.0000 ug/h | INTRAVENOUS | Status: DC
  Administered 2021-06-08 (×2): 50 ug/h via INTRAVENOUS
  Filled 2021-06-08 (×3): qty 1

## 2021-06-08 MED ORDER — ONDANSETRON HCL 4 MG/2ML IJ SOLN
4.0000 mg | Freq: Once | INTRAMUSCULAR | Status: AC
  Administered 2021-06-08: 4 mg via INTRAVENOUS
  Filled 2021-06-08: qty 2

## 2021-06-08 MED ORDER — ALBUMIN HUMAN 25 % IV SOLN
25.0000 g | Freq: Four times a day (QID) | INTRAVENOUS | Status: AC
  Administered 2021-06-08: 12.5 g via INTRAVENOUS
  Administered 2021-06-08 – 2021-06-09 (×3): 25 g via INTRAVENOUS
  Filled 2021-06-08 (×4): qty 100

## 2021-06-08 MED ORDER — CHLORHEXIDINE GLUCONATE CLOTH 2 % EX PADS
6.0000 | MEDICATED_PAD | Freq: Every day | CUTANEOUS | Status: DC
  Administered 2021-06-08 – 2021-06-13 (×6): 6 via TOPICAL

## 2021-06-08 MED ORDER — METOCLOPRAMIDE HCL 5 MG/ML IJ SOLN
10.0000 mg | Freq: Once | INTRAMUSCULAR | Status: AC
  Administered 2021-06-08: 10 mg via INTRAVENOUS
  Filled 2021-06-08: qty 2

## 2021-06-08 MED ORDER — CALCIUM GLUCONATE-NACL 2-0.675 GM/100ML-% IV SOLN
2.0000 g | Freq: Once | INTRAVENOUS | Status: AC
  Administered 2021-06-08: 2000 mg via INTRAVENOUS
  Filled 2021-06-08: qty 100

## 2021-06-08 MED ORDER — SODIUM CHLORIDE 0.9 % IV SOLN
1.0000 mg | Freq: Every day | INTRAVENOUS | Status: DC
  Administered 2021-06-08 – 2021-06-10 (×3): 1 mg via INTRAVENOUS
  Filled 2021-06-08 (×3): qty 0.2

## 2021-06-08 MED ORDER — LACTULOSE ENEMA
300.0000 mL | Freq: Every day | ORAL | Status: DC
  Administered 2021-06-08 – 2021-06-09 (×2): 300 mL via RECTAL
  Filled 2021-06-08 (×3): qty 300

## 2021-06-08 MED ORDER — FENTANYL CITRATE (PF) 100 MCG/2ML IJ SOLN: 50.0000 ug | INTRAMUSCULAR | Status: DC | PRN

## 2021-06-08 MED ORDER — MAGNESIUM SULFATE 2 GM/50ML IV SOLN
2.0000 g | Freq: Once | INTRAVENOUS | Status: AC
Start: 2021-06-08 — End: 2021-06-08
  Administered 2021-06-08: 2 g via INTRAVENOUS
  Filled 2021-06-08: qty 50

## 2021-06-08 MED ORDER — METHYLPREDNISOLONE SODIUM SUCC 40 MG IJ SOLR
40.0000 mg | Freq: Every day | INTRAMUSCULAR | Status: DC
  Administered 2021-06-08 – 2021-06-13 (×6): 40 mg via INTRAVENOUS
  Filled 2021-06-08 (×6): qty 1

## 2021-06-08 MED ORDER — LACTATED RINGERS IV SOLN: INTRAVENOUS | Status: DC

## 2021-06-08 MED ORDER — LACTATED RINGERS IV BOLUS
500.0000 mL | Freq: Once | INTRAVENOUS | Status: AC
Start: 2021-06-08 — End: 2021-06-08
  Administered 2021-06-08: 500 mL via INTRAVENOUS

## 2021-06-08 MED ORDER — PANTOPRAZOLE INFUSION (NEW) - SIMPLE MED
8.0000 mg/h | INTRAVENOUS | Status: DC
  Administered 2021-06-08 – 2021-06-10 (×6): 8 mg/h via INTRAVENOUS
  Filled 2021-06-08 (×6): qty 80

## 2021-06-08 NOTE — ED Notes (Signed)
Central line placed by Dr Roxanne Mins at this time  ?

## 2021-06-08 NOTE — Procedures (Signed)
Intubation Procedure Note ? ?Eric Dennis ?[id number]  ?09-24-1965 ? ?Date:06/08/21  ?Time:3:05 PM  ? ?Provider Performing:Shakeera Rightmyer Rodman Pickle, MD ? ? ?Procedure: Intubation (03212) ? ?Indication(s) ?Respiratory Failure ? ?Consent ?Risks of the procedure as well as the alternatives and risks of each were explained to the patient and/or caregiver.  Consent for the procedure was obtained and is signed in the bedside chart ? ? ?Anesthesia ?Etomidate, Versed, Fentanyl, and Rocuronium ? ? ?Time Out ?Verified patient identification, verified procedure, site/side was marked, verified correct patient position, special equipment/implants available, medications/allergies/relevant history reviewed, required imaging and test results available. ? ? ?Sterile Technique ?Usual hand hygeine, masks, and gloves were used ? ? ?Procedure Description ?Patient positioned in bed supine.  Sedation given as noted above.  Patient was intubated with endotracheal tube using Glidescope.  View was Grade 1 full glottis .  Number of attempts was 1.  Colorimetric CO2 detector was consistent with tracheal placement. ? ? ?Complications/Tolerance ?None; patient tolerated the procedure well. ?Chest X-ray is ordered to verify placement. ? ? ?EBL ?Minimal ? ? ?Specimen(s) ?None ? ?

## 2021-06-08 NOTE — Procedures (Signed)
Foreign Body Airway Removal Procedure Note ? ?Sammuel Cooper  ?[id number]  ?07/18/1965 ? ?Date:06/08/21  ?Time:3:07 PM  ? ?Provider Performing:Lilliah Priego Rodman Pickle, MD  ? ? ?Procedure: Intubation (89169) ? ?Indication(s) ?Respiratory Failure ? ?Consent ?Unable to obtain consent due to emergent nature of procedure. ? ? ?Anesthesia ?None ? ?Time Out ?Verified patient identification, verified procedure, site/side was marked, verified correct patient position, special equipment/implants available, medications/allergies/relevant history reviewed, required imaging and test results available. ? ? ?Sterile Technique ?Usual hand hygeine, masks, and gloves were used ? ? ?Procedure Description ?Larynx was visualized using glidescope. Rogue tooth was adjacent to ETT and vocal cords. Using McGills forceps and suction, tooth was retrieved from airway ? ? ?Incisor tooth adjacent to ETT  ? ?Very poor dentition with loose upper teeth missing and ? ?Complications/Tolerance ?None; patient tolerated the procedure well. ?Chest X-ray is ordered to verify placement. ? ? ?EBL ?Minimal ? ? ?Specimen(s) ?None ? ?

## 2021-06-08 NOTE — ED Notes (Signed)
Pt had episode of vomiting - suctioned by this RN - MD notified - meds ordered  ?

## 2021-06-08 NOTE — Consult Note (Signed)
[facility] Consultation Note ? ?Referring Provider: Critical Care Medicine ?Primary Care Physician:  Scheryl Marten, PA ?Primary Gastroenterologist:  Dr.Karki ? ?Reason for Consultation:  altered mental status ? ?HPI: Eric Dennis is a 56 y.o. male presenting altered mental status.  Patient can provide no history; critical care team called me for consultation, and have plans for intubation.  Reported x 1 episode hematemesis yesterday; no hematemesis, melena, hematochezia in ICU.  ? ? ?Past Medical History:  ?Diagnosis Date  ? Elevated liver function tests   ? High cholesterol   ? Weight loss   ? ? ?Past Surgical History:  ?Procedure Laterality Date  ? COLONOSCOPY N/A 03/14/2014  ? Procedure: COLONOSCOPY;  Surgeon: Rogene Houston, MD;  Location: [facility];  Service: Endoscopy;  Laterality: N/A;  730 - moved to 1/6 @ 12:00 - Ann notified pt  ? COLONOSCOPY WITH PROPOFOL N/A 11/04/2020  ? Procedure: COLONOSCOPY WITH PROPOFOL;  Surgeon: Ronnette Juniper, MD;  Location: [facility];  Service: Gastroenterology;  Laterality: N/A;  ? ESOPHAGEAL BANDING  11/04/2020  ? Procedure: ESOPHAGEAL BANDING;  Surgeon: Ronnette Juniper, MD;  Location: [facility];  Service: Gastroenterology;;  ? ESOPHAGOGASTRODUODENOSCOPY N/A 05/13/2021  ? Procedure: ESOPHAGOGASTRODUODENOSCOPY (EGD);  Surgeon: Ronnette Juniper, MD;  Location: [facility];  Service: Gastroenterology;  Laterality: N/A;  ? ESOPHAGOGASTRODUODENOSCOPY (EGD) WITH PROPOFOL N/A 11/04/2020  ? Procedure: ESOPHAGOGASTRODUODENOSCOPY (EGD) WITH PROPOFOL;  Surgeon: Ronnette Juniper, MD;  Location: [facility];  Service: Gastroenterology;  Laterality: N/A;  ? IR PARACENTESIS  07/19/2020  ? NO PAST SURGERIES    ? POLYPECTOMY  11/04/2020  ? Procedure: POLYPECTOMY;  Surgeon: Ronnette Juniper, MD;  Location: [facility];  Service: Gastroenterology;;  ? ? ?Prior to Admission medications   ?Medication Sig Start Date End Date Taking? Authorizing Provider  ?calcium carbonate (TUMS - DOSED IN MG  ELEMENTAL CALCIUM) 500 MG chewable tablet Chew 2 tablets by mouth 2 (two) times daily as needed for indigestion or heartburn.   Yes [provider]  ?Cholecalciferol (VITAMIN D3) 125 MCG (5000 UT) TABS Take 5,000 Units by mouth every evening.   Yes [provider]  ?folic acid (FOLVITE) 1 MG tablet Take 1 tablet (1 mg total) by mouth daily. ?Patient taking differently: Take 1 mg by mouth every evening. 10/10/18  Yes Tat, Shanon Brow, MD  ?furosemide (LASIX) 40 MG tablet Take 40 mg by mouth every evening. 01/13/19  Yes [provider]  ?ibuprofen (ADVIL) 200 MG tablet Take 400 mg by mouth every 6 (six) hours as needed for headache or moderate pain.   Yes [provider]  ?ondansetron (ZOFRAN) 8 MG tablet Take 1 tablet (8 mg total) by mouth every 8 (eight) hours as needed for nausea or vomiting. ?Patient taking differently: Take 8 mg by mouth See admin instructions. Take 1 tablet (8 mg) by mouth scheduled every night & may take 2 additional dose if needed for nausea/vomiting. 01/18/21  Yes Daleen Bo, MD  ?pantoprazole (PROTONIX) 40 MG tablet Take 1 tablet (40 mg total) by mouth daily. ?Patient taking differently: Take 40 mg by mouth every evening. 08/31/20 06/08/21 Yes Samuella Cota, MD  ?Potassium 99 MG TABS Take 99 mg by mouth every evening.   Yes [provider]  ?spironolactone (ALDACTONE) 100 MG tablet Take 100 mg by mouth every evening. 08/08/20  Yes [provider]  ?thiamine 100 MG tablet Take 1 tablet (100 mg total) by mouth daily. ?Patient taking differently: Take 100 mg by mouth every evening. 08/31/20  Yes Samuella Cota, MD  ?vitamin B-12 (CYANOCOBALAMIN) 500 MCG tablet Take 500 mcg by mouth every evening.   Yes [provider]  ?lactulose (CHRONULAC) 10 GM/15ML solution Take 15 mLs (10 g total) by mouth 2 (two) times daily. ?Patient not taking: Reported on 06/08/2021 08/31/20   Samuella Cota, MD  ? ? ?Current Facility-Administered  Medications  ?Medication Dose Route Frequency Provider Last Rate Last Admin  ? etomidate (AMIDATE) 2 MG/ML injection           ? fentaNYL (SUBLIMAZE) 100 MCG/2ML injection           ? midazolam (VERSED) 2 MG/2ML injection           ? phenylephrine 0.4-0.9 MG/10ML-% injection           ? rocuronium bromide 100 MG/10ML SOSY           ? albumin human 25 % solution 25 g  25 g Intravenous Q6H Bowser, Grace E, NP 60 mL/hr at 06/08/21 1116 12.5 g at 06/08/21 1116  ? cefTRIAXone (ROCEPHIN) 1 g in sodium chloride 0.9 % 100 mL IVPB  1 g Intravenous Q24H Cristal Generous, NP   Stopped at 06/08/21 1225  ? docusate sodium (COLACE) capsule 100 mg  100 mg Oral BID PRN Cristal Generous, NP      ? folic acid 1 mg in sodium chloride 0.9 % 50 mL IVPB  1 mg Intravenous Daily Margaretha Seeds, MD      ? lactated ringers infusion   Intravenous Continuous Delora Fuel, MD 220 mL/hr at 06/08/21 1244 New Bag at 06/08/21 1244  ? lactulose (CHRONULAC) enema 200 gm  300 mL Rectal Daily Bowser, Laurel Dimmer, NP      ? methylPREDNISolone sodium succinate (SOLU-MEDROL) 40 mg/mL injection 40 mg  40 mg Intravenous Daily Bowser, Laurel Dimmer, NP   40 mg at 06/08/21 1235  ? octreotide (SANDOSTATIN) 500 mcg in sodium chloride 0.9 % 250 mL (2 mcg/mL) infusion  50 mcg/hr Intravenous Continuous Cristal Generous, NP 25 mL/hr at 06/08/21 1238 50 mcg/hr at 06/08/21 1238  ? [START ON 06/11/2021] pantoprazole (PROTONIX) injection 40 mg  40 mg Intravenous U54Y Delora Fuel, MD      ? pantoprozole (PROTONIX) 80 mg /NS 100 mL infusion  8 mg/hr Intravenous Continuous Delora Fuel, MD 10 mL/hr at 06/08/21 7062 8 mg/hr at 06/08/21 3762  ? polyethylene glycol (MIRALAX / GLYCOLAX) packet 17 g  17 g Oral Daily PRN Bowser, Laurel Dimmer, NP      ? thiamine (B-1) injection 100 mg  100 mg Intravenous Daily Margaretha Seeds, MD      ? ? ?Allergies as of 06/08/2021  ? (No Known Allergies)  ? ? ?Family History  ?Problem Relation Age of Onset  ? Diabetes Mother   ? Heart disease Maternal  Grandmother   ? ? ?Social History  ? ?Socioeconomic History  ? Marital status: Divorced  ?  Spouse name: Not on file  ? Number of children: 1  ? Years of education: Not on file  ? Highest education level: Not on file  ?Occupational History  ?  Employer: [employer]  ?Tobacco Use  ? Smoking status: Every Day  ?  Packs/day: 0.25  ?  Years: 20.00  ?  Pack years: 5.00  ?  Types: Cigarettes  ? Smokeless tobacco: Never  ? Tobacco comments:  ?  1 pack every 4 days x 20 yrs  ?Vaping Use  ?  Vaping Use: Never used  ?Substance and Sexual Activity  ? Alcohol use: Yes  ?  Alcohol/week: 14.0 standard drinks  ?  Types: 14 Cans of beer per week  ? Drug use: No  ? Sexual activity: Yes  ?Other Topics Concern  ? Not on file  ?Social History Narrative  ? Not on file  ? ?Social Determinants of Health  ? ?Financial Resource Strain: Not on file  ?Food Insecurity: Not on file  ?Transportation Needs: Not on file  ?Physical Activity: Not on file  ?Stress: Not on file  ?Social Connections: Not on file  ?Intimate Partner Violence: Not on file  ? ? ?Review of Systems: Unable to obtain due to altered mental status. ? ?Physical Exam: ?Vital signs in last 24 hours: ?Temp:  [97.1 ?F (36.2 ?C)-97.9 ?F (36.6 ?C)] 97.9 ?F (36.6 ?C) (04/02 1228) ?Pulse Rate:  [83-121] 113 (04/02 1228) ?Resp:  [20-34] 27 (04/02 1228) ?BP: (81-121)/(39-68) 99/54 (04/02 1228) ?SpO2:  [99 %-100 %] 99 % (04/02 1228) ?  ?General:   Not awake or conversant, unable to follow commands ?Head:  Normocephalic and atraumatic. ?Eyes:  Sclera icteric  Conjunctiva pink. ?Ears:  Normal auditory acuity. ?Nose:  No deformity, discharge,  or lesions. ?Mouth:  No deformity or lesions.  Oropharynx pink & moist. ?Neck:  Supple; no masses or thyromegaly. ?Abdomen:  Soft, nontender and mild protuberant, No masses, hepatosplenomegaly or hernias noted. Normal bowel sounds, without guarding, and without rebound.     ?Msk:  Symmetrical without gross deformities. Normal posture. ?Pulses:  Normal  pulses noted. ?Extremities:  Without clubbing; trace bilateral lower extremity edema. ?Neurologic:  Somnolent, doesn't follow commands ?Skin:  Intact without significant lesions or rashes. ?Psych:  Somnolent, doesn't open

## 2021-06-08 NOTE — Plan of Care (Signed)
?  Interdisciplinary Goals of Care Family Meeting ? ? ?Date carried out: 06/08/2021 ? ?Location of the meeting: Bedside ? ?Member's involved: Nurse Practitioner, Bedside Registered Nurse, and Family Member or next of kin ? ?Durable Power of Tour manager: Wife   ? ?Discussion: We discussed goals of care for Eric Dennis .  Discussed advanced liver disease-- hepatic encephalopathy, alcoholic cirrhosis, alcoholic hepatitis, as well as AKI, hypovolemic shock, GIB with hx esophageal varices. I shared concern for overall prognosis given advanced liver disease which wife is also concerned about but hopeful in the acute sense. Discussed ICU admission, possible need for intubation depending on mentation, GIB, possible need for transfusion, possible need for pressors-- to which wife is agreeable.  ?All questions answered  ? ?Code status: Full Code ? ?Disposition: Continue current acute care ? ?Time spent for the meeting: 15 min ? ? ?Eliseo Gum MSN, AGACNP-BC ?[facility] ?[phone] ?Amion for pager  ?06/08/2021, 9:29 AM ? ? ?06/08/2021, 9:25 AM ? ? ?

## 2021-06-08 NOTE — ED Notes (Signed)
Pt is hard stick - this RN attempted x2, second RN to attempt and Dr Roxanne Mins at bedside to attempt [place] - IV team consult placed  ?

## 2021-06-08 NOTE — ED Notes (Addendum)
Dr Roxanne Mins at bedside - LR bolus ordered - Pressure bagged in  ?

## 2021-06-08 NOTE — ED Provider Notes (Signed)
?[facility] ?Provider Note ? ? ?CSN: [id number] ?Arrival date & time: 06/08/21  0220 ? ?  ? ?History ? ?Chief Complaint  ?Patient presents with  ? Altered Mental Status  ? ? ?Eric Dennis is a 56 y.o. male. ? ?The history is provided by the EMS personnel. The history is limited by the condition of the patient (Altered mental status).  ?Altered Mental Status ?He has history of alcoholic cirrhosis, hyperlipidemia and was brought in by EMS as a code STEMI.  They were called to the home for vomiting and altered mental status.  He reportedly has been vomiting for the last 3 days and not eating.  He had fallen several times.  When they did an ECG, it showed evidence of STEMI and they transported him as a code STEMI, which has been canceled by cardiology after they reviewed the ECG.  Patient is unable to give any history whatsoever. ?  ?Home Medications ?Prior to Admission medications   ?Medication Sig Start Date End Date Taking? Authorizing Provider  ?Cholecalciferol (VITAMIN D3) 125 MCG (5000 UT) TABS Take 5,000 Units by mouth every evening.    [provider]  ?folic acid (FOLVITE) 1 MG tablet Take 1 tablet (1 mg total) by mouth daily. ?Patient taking differently: Take 1 mg by mouth every evening. 10/10/18   Orson Eva, MD  ?furosemide (LASIX) 40 MG tablet Take 40 mg by mouth every evening. 01/13/19   [provider]  ?ibuprofen (ADVIL) 200 MG tablet Take 400 mg by mouth every 6 (six) hours as needed for headache or moderate pain.    [provider]  ?lactulose (CHRONULAC) 10 GM/15ML solution Take 15 mLs (10 g total) by mouth 2 (two) times daily. ?Patient taking differently: Take 10 g by mouth 2 (two) times daily as needed (constipation). 08/31/20   Samuella Cota, MD  ?ondansetron (ZOFRAN) 8 MG tablet Take 1 tablet (8 mg total) by mouth every 8 (eight) hours as needed for nausea or vomiting. ?Patient taking differently: Take 8 mg by mouth See admin  instructions. Take 1 tablet (8 mg) by mouth scheduled every night & may take 2 additional dose if needed for nausea/vomiting. 01/18/21   Daleen Bo, MD  ?pantoprazole (PROTONIX) 40 MG tablet Take 1 tablet (40 mg total) by mouth daily. 08/31/20 05/13/21  Samuella Cota, MD  ?Potassium 99 MG TABS Take 99 mg by mouth every evening.    [provider]  ?spironolactone (ALDACTONE) 100 MG tablet Take 100 mg by mouth every evening. 08/08/20   [provider]  ?thiamine 100 MG tablet Take 1 tablet (100 mg total) by mouth daily. ?Patient taking differently: Take 100 mg by mouth every evening. 08/31/20   Samuella Cota, MD  ?vitamin B-12 (CYANOCOBALAMIN) 500 MCG tablet Take 500 mcg by mouth every evening.    [provider]  ?   ? ?Allergies    ?Patient has no known allergies.   ? ?Review of Systems   ?Review of Systems  ?Unable to perform ROS: Mental status change  ? ?Physical Exam ?Updated Vital Signs ?BP (!) 81/52   Pulse (!) 117   Resp (!) 34   SpO2 100%  ?Physical Exam ?Vitals and nursing note reviewed.  ?56 year old male, appears chronically ill, responsive to painful stimuli.  Vital signs are significant for low blood pressure, elevated heart rate, elevated respiratory rate. Oxygen saturation is 100%, which is normal. ?Head is normocephalic and atraumatic. PERRLA, EOMI.  marked scleral icterus is present. ?Neck is supple without adenopathy or JVD. ?Lungs are clear without rales, wheezes, or rhonchi. ?Chest is nontender. ?Heart has regular rate and rhythm without murmur. ?Abdomen is soft, flat, without masses or hepatosplenomegaly and peristalsis is hypoactive.  No caput medusa or fluid wave present. ?Extremities have no cyanosis or edema, full range of motion is present. ?Skin is warm and dry without rash.  Multiple ecchymoses are present. ?Neurologic: Responds to deep painful stimuli with somewhat purposeful movement, cranial nerves are intact, moves all extremities equally.  No  asterixis appreciated. ? ?ED Results / Procedures / Treatments   ?Labs ?(all labs ordered are listed, but only abnormal results are displayed) ?Labs Reviewed  ?COMPREHENSIVE METABOLIC PANEL  ?CBC WITH DIFFERENTIAL/PLATELET  ?PROTIME-INR  ?LIPASE, BLOOD  ?AMMONIA  ?URINALYSIS, ROUTINE W REFLEX MICROSCOPIC  ?MAGNESIUM  ? ? ?EKG ?EKG Interpretation ? ?Date/Time:  Sunday June 08 2021 02:30:55 EDT ?Ventricular Rate:  115 ?PR Interval:  157 ?QRS Duration: 100 ?QT Interval:  367 ?QTC Calculation: 508 ?R Axis:   -80 ?Text Interpretation: Sinus tachycardia Ventricular premature complex Markedly posterior QRS axis RSR' in V1 or V2, probably normal variant Prolonged QT interval When compared with ECG of 01/18/2021, No significant change was found Confirmed by Delora Fuel ([id number]) on 06/08/2021 5:12:56 AM ? ? EKG Interpretation ? ?Date/Time:  Sunday June 08 2021 04:56:10 EDT ?Ventricular Rate:  120 ?PR Interval:  116 ?QRS Duration: 102 ?QT Interval:  475 ?QTC Calculation: 672 ?R Axis:   -76 ?Text Interpretation: Sinus tachycardia Ventricular trigeminy LAD, consider left anterior fascicular block Abnormal R-wave progression, late transition Borderline ST elevation, anterolateral leads Prolonged QT interval When compared with ECG of EARLIER SAME DATE No significant change was found Confirmed by Delora Fuel ([id number]) on 06/08/2021 5:13:36 AM ?  ? ?  ? ? ? ?Radiology ?DG Abdomen 1 View ? ?Result Date: 06/08/2021 ?CLINICAL DATA:  Central line placement. EXAM: ABDOMEN - 1 VIEW COMPARISON:  None. FINDINGS: The bowel gas pattern is normal. No renal calculi. The distal tip of a right central femoral central line terminates at the level of L4. IMPRESSION: The distal tip of a right femoral central line terminates at the level of L4. Electronically Signed   By: Brett Fairy M.D.   On: 06/08/2021 04:37  ? ?CT Head Wo Contrast ? ?Result Date: 06/08/2021 ?CLINICAL DATA:  Mental status change, unknown cause. EXAM: CT HEAD WITHOUT CONTRAST TECHNIQUE:  Contiguous axial images were obtained from the base of the skull through the vertex without intravenous contrast. RADIATION DOSE REDUCTION: This exam was performed according to the departmental dose-optimization program which includes automated exposure control, adjustment of the mA and/or kV according to patient size and/or use of iterative reconstruction technique. COMPARISON:  None. FINDINGS: Brain: No acute intracranial hemorrhage, midline shift or mass effect. No extra-axial fluid collection. Gray-white matter differentiation is within normal limits and there is no hydrocephalus. Mild periventricular white matter hypodensities are noted bilaterally. Vascular: No hyperdense vessel or unexpected calcification. Skull: Normal. Negative for fracture or focal lesion. Sinuses/Orbits: A round density is present in the right maxillary sinus, possible mucosal retention cyst. The orbits are within normal limits. Other: None. IMPRESSION: No acute intracranial process. Electronically Signed   By: Brett Fairy M.D.   On: 06/08/2021 05:03  ? ?DG Chest Port 1 View ? ?Result Date: 06/08/2021 ?CLINICAL DATA:  Altered mental status. EXAM: PORTABLE CHEST 1 VIEW COMPARISON:  08/26/2020. FINDINGS: The heart size and mediastinal contours are within normal  limits. Both lungs are clear. A stable tiny radiopaque density is present over the left chest. No acute osseous abnormality. IMPRESSION: No active disease. Electronically Signed   By: Brett Fairy M.D.   On: 06/08/2021 02:43   ? ?Procedures ?Marland KitchenCentral Line ? ?Date/Time: 06/08/2021 6:04 AM ?Performed by: Delora Fuel, MD ?Authorized by: Delora Fuel, MD  ? ?Consent:  ?  Consent obtained:  Emergent situation ?Universal protocol:  ?  Procedure explained and questions answered to patient or proxy's satisfaction: yes   ?  Relevant documents present and verified: yes   ?  Test results available: yes   ?  Required blood products, implants, devices, and special equipment available: yes   ?   Site/side marked: yes   ?  Immediately prior to procedure, a time out was called: yes   ?  Patient identity confirmed:  Hospital-assigned identification number and arm band ?Pre-procedure details:  ?  Indication(

## 2021-06-08 NOTE — ED Triage Notes (Signed)
Pt BIB [facility]. EMS called out for altered mental status. When EMS arrived pt was minimaly responsive and laying in bed. Fam reports pt fell 3x yesterday.  ?Pt has hx of cirrhosis and is very jaundice.  ?Fam states pt has been vomiting dark brown for x3 days. BP with ems 90/60 ?Cbg 190 ?Tachy in the 120s  ?

## 2021-06-08 NOTE — H&P (Addendum)
? ?NAME:  MALICHI PALARDY, MRN:  [id number], DOB:  December 04, 1965, LOS: 0 ?ADMISSION DATE:  06/08/2021, CONSULTATION DATE:  06/08/21 ?REFERRING MD:  Roxanne Mins - [facility] , CHIEF COMPLAINT:  GIB, AMS   ? ?History of Present Illness:  ?56 yo PMH EtOH abuse, alcoholic cirrhosis,  prior esophageal varices s/p banding, who presented to [facility] ED 4/2 with AMS. History obtained from wife who is at bedside. She reports that patient has been progressively more altered x half a week prior to ED presentation but had refused recommendations from family to seek medical evaluation. On 4/2 around 0100 wife went to check on pt and he was not responding to her but was breathing, and when she lifted his eyelids his eyes were rolled back -- prompting her to call EMS. Wife reports associated vomiting x 3 days and progressive muscle weakness and cramping, which has been worse x several weeks but ongoing since 2022. He has fallen at home several times in the past few days. She reports that his EtOH intake is down from what it used to be, but that he has at least a bottle of wine and 3 beers daily. Has a very poor PO intake otherwise.  ?With EMS, ECG signalled STEMI, and he was apparently brought to ED as code STEMI. Per report, cardiology reviewed ECG and cancelled code stemi. ?ECG in ED with prolonged qtc, borderline STE anterior leads, ventricular trigeminy.  ? ?Patient had very poor mentation in ED and labs were significant for elevated LFTs, INR and ammonia + AKI, anemia and several electrolyte abnormalities. He also was reported to have bloody emesis. He was started on PPI gtt, IVF, mag ?CT H without acute intracranial process. CXR without evidence of PNA  ? ?PCCM consulted for admission in this setting  ? ? ?Pertinent  Medical History  ?Alcoholic cirrhosis ?Alcohol abuse ?Esophageal varices ? ?Significant Hospital Events: ?Including procedures, antibiotic start and stop dates in addition to other pertinent events   ?4/2 ED for AMS and ? Code stemi. +  HE, + GIB + AKI. Admitting to ICU  ? ?Interim History / Subjective:  ?Receiving LR. CVC placed by EM due to inability to get PIV ? ?Objective   ?Blood pressure (!) 94/39, pulse (!) 103, temperature 97.6 ?F (36.4 ?C), temperature source Axillary, resp. rate (!) 30, SpO2 100 %. ?   ?   ? ?Intake/Output Summary (Last 24 hours) at 06/08/2021 0921 ?Last data filed at 06/08/2021 0751 ?Gross per 24 hour  ?Intake 1149.43 ml  ?Output --  ?Net 1149.43 ml  ? ?There were no vitals filed for this visit. ? ?Examination: ?General: Chronically and critically ill middle aged M  ?Neuro: Opens eyes to tactile stimulation, withdraws to pain. Pupils are 28m sluggish.  ?HENT: Icteric sclera. Tacky mm. Scant dried blood on lips. ?Lungs: CTAb, deep unlabored respirations, symmetrical chest expansion  ?Cardiovascular: tachycardic with ventricular trigeminy  ?Abdomen: protuberant, soft, + bowel sounds  ?GU: condom cath.  ?Extremities: R fem CVC. No acute joint deformity. No cyanosis  ?Skin: Jaundice. Cool to touch, clean and dry  ? ?Resolved Hospital Problem list   ? ? ?Assessment & Plan:  ? ?Hepatic encephalopathy ?Alcoholic Cirrhosis ?Alcoholic Hepatitis ?Coagulopathy  ?Hyperbilirubinemia  ?Hyperammonemia  ?Transaminitis  ?-MELD 32. MDF 78.3 ?-admitting with Na 139 INR 2.4 Tbili 19 NH4 112 AST:ALT>2  ?P ?-admit to ICU ?-NPO ?-close monitoring for airway protection  ?-lactulose enema.  ?-solumedrol 40 qD  ?-empiric rocephin  ?-cont LR ?-Albumin  ?-rifaximin when taking  PO or if enteral access established  ?-variable data for NAC and alcoholic hepatitis, will defer at present ?-repeat CMP, INR this afternoon and AM  ?-with report of 1x hematemesis will give 1 FFP for coagulopathy  ?-RUQ [place] ? ?GIB with reported hematemesis  ?Hx esophageal varices s/p banding x3 (2022 Dr Mary Sella) ?-had hematemesis x1 in ED but no further ?Anemia -- ABLA with possible component of chronic disease ?Thrombocytopenia  ?P ?-starting octreotide ?-PPI  ?-type and  screen ?-trend CBC ?-transfuse as needed  ?-low threshold for GI consult  ? ?Hypovolemic shock ?-very poor PO intake +/- blood loss from GIB ?P ?-cont aggressive fluid resusc as above ?-check LA ?-MAP goal will be > 75 given hepatic and renal dysfxn -- cant r/o HRS  ?-may require pressors  ? ?AKI ?P ?-IVF  ?-trend renal indices, UOP ?-Condom cath + bladder scanning, might benefit from foley  ?-octreotide & albumin as above ?-MAP >75 -- (incr of about10-15). Not requiring NE at present but may  ? ?Ventricular trigeminy ?Prolonged qtc ?-there was OOH question of STEMI, but  cancelled after cards review of ECG. Second ECG in ED with borderline anterolateral STE ?P ?-electrolyte optimization  ?-check trops  ?-PRN ecg  ? ?Hypomagnesemia  ?Borderline hypokalemia ?P ?-replace K and mag ?-trend lytes  ? ?Protein calorie malnutrition due to alcoholism  ?Failure to thrive in adult  ?P ?-maintain NPO for now ?-eventually RDN consult  ? ?Best Practice (right click and "Reselect all SmartList Selections" daily)  ? ?Diet/type: NPO ?DVT prophylaxis: SCD ?GI prophylaxis: PPI ?Lines: Central line ?Foley:  N/A ?Code Status:  full code ?Last date of multidisciplinary goals of care discussion [4/2 wife at bedside ] ? ?Labs   ?CBC: ?Recent Labs  ?Lab 06/08/21 ?0320  ?WBC 11.2*  ?NEUTROABS 8.6*  ?HGB 7.9*  ?HCT 22.8*  ?MCV 122.6*  ?PLT 118*  ? ? ?Basic Metabolic Panel: ?Recent Labs  ?Lab 06/08/21 ?0320  ?NA 139  ?K 3.5  ?CL 90*  ?CO2 30  ?GLUCOSE 155*  ?BUN 63*  ?CREATININE 1.55*  ?CALCIUM 10.4*  ?MG 1.4*  ? ?GFR: ?CrCl cannot be calculated (Unknown ideal weight.). ?Recent Labs  ?Lab 06/08/21 ?0320  ?WBC 11.2*  ? ? ?Liver Function Tests: ?Recent Labs  ?Lab 06/08/21 ?0320  ?AST 102*  ?ALT 48*  ?ALKPHOS 86  ?BILITOT 19.0*  ?PROT 5.6*  ?ALBUMIN 2.3*  ? ?Recent Labs  ?Lab 06/08/21 ?0320  ?LIPASE 50  ? ?Recent Labs  ?Lab 06/08/21 ?0320  ?AMMONIA 112*  ? ? ?ABG ?   ?Component Value Date/Time  ? HCO3 38.8 (H) 08/26/2020 1935  ? TCO2 30  05/13/2021 0745  ? O2SAT 65.7 08/26/2020 1935  ?  ? ?Coagulation Profile: ?Recent Labs  ?Lab 06/08/21 ?0320  ?INR 2.4*  ? ? ?Cardiac Enzymes: ?No results for input(s): CKTOTAL, CKMB, CKMBINDEX, TROPONINI in the last 168 hours. ? ?HbA1C: ?Hgb A1c MFr Bld  ?Date/Time Value Ref Range Status  ?10/08/2018 05:28 AM 5.7 (H) 4.8 - 5.6 % Final  ?  Comment:  ?  (NOTE) ?Pre diabetes:          5.7%-6.4% ?Diabetes:              >6.4% ?Glycemic control for   <7.0% ?adults with diabetes ?  ? ? ?CBG: ?No results for input(s): GLUCAP in the last 168 hours. ? ?Review of Systems:   ?Unable to obtain due to encephalopathy ? ?Past Medical History:  ?He,  has a past medical history  of Elevated liver function tests, High cholesterol, and Weight loss. Alcoholism, alcoholic cirrhosis, esophageal varices  ? ?Surgical History:  ? ?Past Surgical History:  ?Procedure Laterality Date  ? COLONOSCOPY N/A 03/14/2014  ? Procedure: COLONOSCOPY;  Surgeon: Rogene Houston, MD;  [facility];  Service: Endoscopy;  Laterality: N/A;  730 - moved to 1/6 @ 12:00 - Ann notified pt  ? COLONOSCOPY WITH PROPOFOL N/A 11/04/2020  ? Procedure: COLONOSCOPY WITH PROPOFOL;  Surgeon: Ronnette Juniper, MD;  Location: [facility];  Service: Gastroenterology;  Laterality: N/A;  ? ESOPHAGEAL BANDING  11/04/2020  ? Procedure: ESOPHAGEAL BANDING;  Surgeon: Ronnette Juniper, MD;  Location: [facility];  Service: Gastroenterology;;  ? ESOPHAGOGASTRODUODENOSCOPY N/A 05/13/2021  ? Procedure: ESOPHAGOGASTRODUODENOSCOPY (EGD);  Surgeon: Ronnette Juniper, MD;  Location: [facility];  Service: Gastroenterology;  Laterality: N/A;  ? ESOPHAGOGASTRODUODENOSCOPY (EGD) WITH PROPOFOL N/A 11/04/2020  ? Procedure: ESOPHAGOGASTRODUODENOSCOPY (EGD) WITH PROPOFOL;  Surgeon: Ronnette Juniper, MD;  Location: [facility];  Service: Gastroenterology;  Laterality: N/A;  ? IR PARACENTESIS  07/19/2020  ? NO PAST SURGERIES    ? POLYPECTOMY  11/04/2020  ? Procedure: POLYPECTOMY;  Surgeon: Ronnette Juniper, MD;   Location: [facility];  Service: Gastroenterology;;  ?  ? ?Social History:  ? reports that he has been smoking cigarettes. He has a 5.00 pack-year smoking history. He has never used smokeless tobacco. He reports current alcoho

## 2021-06-08 NOTE — ED Notes (Signed)
MD notified of critical bilirubin  ?

## 2021-06-09 ENCOUNTER — Inpatient Hospital Stay (HOSPITAL_COMMUNITY): Payer: Medicaid Other

## 2021-06-09 DIAGNOSIS — K7682 Hepatic encephalopathy: Secondary | ICD-10-CM | POA: Diagnosis not present

## 2021-06-09 DIAGNOSIS — K7290 Hepatic failure, unspecified without coma: Secondary | ICD-10-CM | POA: Diagnosis not present

## 2021-06-09 DIAGNOSIS — K922 Gastrointestinal hemorrhage, unspecified: Secondary | ICD-10-CM | POA: Diagnosis not present

## 2021-06-09 DIAGNOSIS — N179 Acute kidney failure, unspecified: Secondary | ICD-10-CM

## 2021-06-09 DIAGNOSIS — K7460 Unspecified cirrhosis of liver: Secondary | ICD-10-CM

## 2021-06-09 LAB — GLUCOSE, CAPILLARY
Glucose-Capillary: 150 mg/dL — ABNORMAL HIGH (ref 70–99)
Glucose-Capillary: 159 mg/dL — ABNORMAL HIGH (ref 70–99)
Glucose-Capillary: 180 mg/dL — ABNORMAL HIGH (ref 70–99)
Glucose-Capillary: 189 mg/dL — ABNORMAL HIGH (ref 70–99)
Glucose-Capillary: 204 mg/dL — ABNORMAL HIGH (ref 70–99)
Glucose-Capillary: 213 mg/dL — ABNORMAL HIGH (ref 70–99)

## 2021-06-09 LAB — BPAM FFP
Blood Product Expiration Date: 202304062359
ISSUE DATE / TIME: 202304021207
Unit Type and Rh: 6200

## 2021-06-09 LAB — COMPREHENSIVE METABOLIC PANEL
ALT: 57 U/L — ABNORMAL HIGH (ref 0–44)
AST: 195 U/L — ABNORMAL HIGH (ref 15–41)
Albumin: 2.9 g/dL — ABNORMAL LOW (ref 3.5–5.0)
Alkaline Phosphatase: 54 U/L (ref 38–126)
Anion gap: 15 (ref 5–15)
BUN: 103 mg/dL — ABNORMAL HIGH (ref 6–20)
CO2: 25 mmol/L (ref 22–32)
Calcium: 8.3 mg/dL — ABNORMAL LOW (ref 8.9–10.3)
Chloride: 93 mmol/L — ABNORMAL LOW (ref 98–111)
Creatinine, Ser: 3.14 mg/dL — ABNORMAL HIGH (ref 0.61–1.24)
GFR, Estimated: 22 mL/min — ABNORMAL LOW (ref >60)
Glucose, Bld: 183 mg/dL — ABNORMAL HIGH (ref 70–99)
Potassium: 3.6 mmol/L (ref 3.5–5.1)
Sodium: 133 mmol/L — ABNORMAL LOW (ref 135–145)
Total Bilirubin: 20.9 mg/dL (ref 0.3–1.2)
Total Protein: 5.3 g/dL — ABNORMAL LOW (ref 6.5–8.1)

## 2021-06-09 LAB — CBC
HCT: 19.3 % — ABNORMAL LOW (ref 39.0–52.0)
Hemoglobin: 7.2 g/dL — ABNORMAL LOW (ref 13.0–17.0)
MCH: 38.3 pg — ABNORMAL HIGH (ref 26.0–34.0)
MCHC: 37.3 g/dL — ABNORMAL HIGH (ref 30.0–36.0)
MCV: 102.7 fL — ABNORMAL HIGH (ref 80.0–100.0)
Platelets: 65 10*3/uL — ABNORMAL LOW (ref 150–400)
RBC: 1.88 MIL/uL — ABNORMAL LOW (ref 4.22–5.81)
WBC: 10.4 10*3/uL (ref 4.0–10.5)
nRBC: 2.1 % — ABNORMAL HIGH (ref 0.0–0.2)

## 2021-06-09 LAB — PREPARE FRESH FROZEN PLASMA: Unit division: 0

## 2021-06-09 LAB — MAGNESIUM: Magnesium: 1.8 mg/dL (ref 1.7–2.4)

## 2021-06-09 LAB — PROTIME-INR
INR: 2.7 — ABNORMAL HIGH (ref 0.8–1.2)
Prothrombin Time: 28.5 seconds — ABNORMAL HIGH (ref 11.4–15.2)

## 2021-06-09 MED ORDER — VITAL 1.5 CAL PO LIQD
1000.0000 mL | ORAL | Status: DC
Start: 2021-06-09 — End: 2021-06-10
  Administered 2021-06-09 – 2021-06-11 (×2): 1000 mL
  Filled 2021-06-09: qty 1000

## 2021-06-09 MED ORDER — PROSOURCE TF PO LIQD
45.0000 mL | Freq: Three times a day (TID) | ORAL | Status: DC
  Administered 2021-06-09 – 2021-06-12 (×9): 45 mL
  Filled 2021-06-09 (×8): qty 45

## 2021-06-09 MED ORDER — IPRATROPIUM-ALBUTEROL 0.5-2.5 (3) MG/3ML IN SOLN: 3.0000 mL | Freq: Four times a day (QID) | RESPIRATORY_TRACT | Status: DC | PRN

## 2021-06-09 MED ORDER — CALCIUM GLUCONATE-NACL 2-0.675 GM/100ML-% IV SOLN
2.0000 g | Freq: Once | INTRAVENOUS | Status: AC
  Administered 2021-06-09: 2000 mg via INTRAVENOUS
  Filled 2021-06-09: qty 100

## 2021-06-09 MED ORDER — INSULIN ASPART 100 UNIT/ML IJ SOLN
0.0000 [IU] | Freq: Four times a day (QID) | INTRAMUSCULAR | Status: DC
  Administered 2021-06-09: 1 [IU] via SUBCUTANEOUS

## 2021-06-09 MED ORDER — INSULIN ASPART 100 UNIT/ML IJ SOLN
0.0000 [IU] | INTRAMUSCULAR | Status: DC
  Administered 2021-06-09: 2 [IU] via SUBCUTANEOUS
  Administered 2021-06-09: 1 [IU] via SUBCUTANEOUS
  Administered 2021-06-09: 2 [IU] via SUBCUTANEOUS
  Administered 2021-06-10: 1 [IU] via SUBCUTANEOUS
  Administered 2021-06-10: 2 [IU] via SUBCUTANEOUS

## 2021-06-09 NOTE — Progress Notes (Signed)
? ?NAME:  Eric Dennis, MRN:  [id number], DOB:  05/14/1965, LOS: 1 ?ADMISSION DATE:  06/08/2021, CONSULTATION DATE:  06/08/21 ?REFERRING MD:  Glick-TRH, CHIEF COMPLAINT:  AMS, GIB  ? ?History of Present Illness:  ?56 year old male with past medical hx of alcohol use c/b alcoholic cirrhosis and prior esophageal varices s/p banding, presented to [facility] ED on 06/08/21 with AMS with family reporting half a week hx of worsening alteration in mental status with concomitant low PO intake. Patient hypotensive in the ED. Lab work showed patient having elevated LFTs, INR, and ammonia, along with AKI, and electrolyte abnormalities. Imaging negative for head injury or evidence of pneumonia.  ? ?Other Pertinent Medical History  ?Anemia ?Thrombocytopenia ?Alcohol use disorder ?Transaminitis ? ?Significant Hospital Events: ?Including procedures, antibiotic start and stop dates in addition to other pertinent events   ?04/02 Presented to the ED with worsening AMS and difficulty protecting the airway. Patient intubated. Transferred to ICU given acute risk for decompensation. AKI improved with IVF. ?04/03 Nephrology consulted for worsening AKI 2.20-->3.14.  ? ?Interim History / Subjective:  ?Patient unresponsive to commands, but withdraws to pain. Laying comfortably, intubated.   ?Objective   ?Blood pressure (!) 127/59, pulse (!) 107, temperature 99.2 ?F (37.3 ?C), temperature source Oral, resp. rate 16, height '6\' 4"'$  (1.93 m), weight 87.5 kg, SpO2 100 %. ?   ?Vent Mode: PRVC ?FiO2 (%):  [40 %-100 %] 40 % ?Set Rate:  [16 bmp-20 bmp] 16 bmp ?Vt Set:  [690 mL] 690 mL ?PEEP:  [5 cmH20] 5 cmH20 ?Plateau Pressure:  [11 cmH20-18 cmH20] 15 cmH20  ? ?Intake/Output Summary (Last 24 hours) at 06/09/2021 0931 ?Last data filed at 06/09/2021 0800 ?Gross per 24 hour  ?Intake 6405.37 ml  ?Output 1685 ml  ?Net 4720.37 ml  ? ?Filed Weights  ? 06/09/21 0424  ?Weight: 87.5 kg  ? ? ?Examination: ?General: Somnolent male in NAD laying comfortably bed ?HENT: Eyes  sluggish to response, icteric sclera, patient intubated.  ?Lungs: CTAB  ?Cardiovascular: tachycardic, sinus rhythm, 2+ pulses in all extremities.  ?Abdomen: normoactive bowel sounds, no TTP ?Extremities: normal bulk and tone, no asymmetry noted.  ?Neuro: somnolent, no alert.  ? ? ?Consults  ?Nephrology following; appreciate their assistance in taking care of this patient.  ?GI following; appreciate their assistance in taking care of this patient.  ?  ?Assessment & Plan:  ?Hepatic Encephalopathy ?Alcohol cirrhosis ?Alcoholic hepatitis ?Coagulopathy ?Thrombocytopenia ?Hyperbilirubinemia ?Hyperammonemia ?Transaminitis ?MELD-Na score 34 with 65 % estimated 90 day mortality. MDF 96.8 suggesting poor prognosis. S/p 1 FFP for coagulopathy.  ?-Continue lactulose 200 mg enema for hyperammonemia. IV rocephin 1g qd for SBP ppx. Continue Solumedrol 40 mg qd for alcoholic hepatitis. Discontinue octreotide given less concern for variceal bleed from GI.   ?-Continue LR 125cc/hr, albumin 25g q 6hrs ?-Placing cor-trak; plan to give PO rifaximin  ?-CTM CMP, INR ? ?Hypovolemic shock ?2/2 to problem 1 resulting in low po intake and pts hematemesis.  ?-Continue LR. MAP goal is >75 given hepatic and liver pathology.  ? ?AKI ?Worsening this morning. Patient had hypotensive episode yesterday but HRS a concern given his cirrhosis.  ?-Nephrology consulted ?-CTM renal fxn ?-Continue albumin and octreotide as above ?-Renally dose med and avoid nephrotoxic meds ? ?GIB with reported hematemesis ?Hx of esophageal bleed ?Hx of anemia  ?Given hx of esophageal bleed and reported hematemesis, GI consulted and following. No endoscopy recommended given recent endoscopy showing no large varices. GI will consider endoscopy if pt rebleeds and  INR is </=1.7.  Hgb stable.  ?-Continue empiric abx. Protonix '80mg'$ /100cc at 10cc/hr. Discontinue octreotide as discussed above.  ?-Transfuse with hgb goal of >7.  ?-Call [facility] if pt re-bleeds. ? ?Ventricle  trigeminy ?Prolonged qtc ?Electrolyte abnormalities ?K and mag wnl on 04/03 ?Replete as needed for K goal of 4 and Mag goal of 2. ? ?Protein calorie malnutrition due to alcoholism ?Failure to thrive in adults ?2/2 alcohol use disorder and hepatic cirrhosis. Plan to place cor-trak today. ?-NPO for now ?-RD consult for nutrition optimization ? ?Best Practice (right click and "Reselect all SmartList Selections" daily)  ? ?Diet/type: NPO w/ meds via tube ?DVT prophylaxis: SCD ?GI prophylaxis: PPI ?Lines: Central line ?IV: LR, PPI ?Foley:  N/A ?Code Status:  full code ?Last date of multidisciplinary goals of care discussion [04/02] ? ?Labs   ?CBC: ?Recent Labs  ?Lab 06/08/21 ?0320 06/08/21 ?1556 06/08/21 ?1611 06/08/21 ?2255 06/09/21 ?0440  ?WBC 11.2*  --   --  9.4 10.4  ?NEUTROABS 8.6*  --   --   --   --   ?HGB 7.9* 5.4* 5.7* 7.6* 7.2*  ?HCT 22.8* 16.0* 16.5* 20.8* 19.3*  ?MCV 122.6*  --   --  103.0* 102.7*  ?PLT 118*  --   --  60* 65*  ? ? ?Basic Metabolic Panel: ?Recent Labs  ?Lab 06/08/21 ?0320 06/08/21 ?1025 06/08/21 ?1556 06/09/21 ?0440  ?NA 139 138 135 133*  ?K 3.5 3.9 4.4 3.6  ?CL 90* 94*  --  93*  ?CO2 30 25  --  25  ?GLUCOSE 155* 144*  --  183*  ?BUN 63* 76*  --  103*  ?CREATININE 1.55* 2.20*  --  3.14*  ?CALCIUM 10.4* 9.6  --  8.3*  ?MG 1.4*  --   --  1.8  ? ?GFR: ?Estimated Creatinine Clearance: 31.9 mL/min (A) (by C-G formula based on SCr of 3.14 mg/dL (H)). ?Recent Labs  ?Lab 06/08/21 ?0320 06/08/21 ?2255 06/09/21 ?0440  ?WBC 11.2* 9.4 10.4  ? ? ?Liver Function Tests: ?Recent Labs  ?Lab 06/08/21 ?0320 06/08/21 ?1025 06/09/21 ?0440  ?AST 102* 83* 195*  ?ALT 48* 40 57*  ?ALKPHOS 86 72 54  ?BILITOT 19.0* 16.6* 20.9*  ?PROT 5.6* 4.9* 5.3*  ?ALBUMIN 2.3* 1.9* 2.9*  ? ?Recent Labs  ?Lab 06/08/21 ?0320  ?LIPASE 50  ? ?Recent Labs  ?Lab 06/08/21 ?0320  ?AMMONIA 112*  ? ? ?ABG ?   ?Component Value Date/Time  ? PHART 7.550 (H) 06/08/2021 1556  ? PCO2ART 31.4 (L) 06/08/2021 1556  ? PO2ART 443 (H) 06/08/2021 1556  ?  HCO3 27.5 06/08/2021 1556  ? TCO2 28 06/08/2021 1556  ? O2SAT 100 06/08/2021 1556  ?  ? ?Coagulation Profile: ?Recent Labs  ?Lab 06/08/21 ?0320 06/09/21 ?0440  ?INR 2.4* 2.7*  ? ? ?Cardiac Enzymes: ?No results for input(s): CKTOTAL, CKMB, CKMBINDEX, TROPONINI in the last 168 hours. ? ?HbA1C: ?Hgb A1c MFr Bld  ?Date/Time Value Ref Range Status  ?10/08/2018 05:28 AM 5.7 (H) 4.8 - 5.6 % Final  ?  Comment:  ?  (NOTE) ?Pre diabetes:          5.7%-6.4% ?Diabetes:              >6.4% ?Glycemic control for   <7.0% ?adults with diabetes ?  ? ? ?CBG: ?Recent Labs  ?Lab 06/08/21 ?1151 06/08/21 ?1509 06/08/21 ?1920 06/09/21 ?2778 06/09/21 ?2423  ?GLUCAP 109* 129* 183* 180* 159*  ? ? ?Review of Systems:   ?Unable to  perform as patient is sedated and intubated.  ? ?Past Medical History:  ?He,  has a past medical history of Elevated liver function tests, High cholesterol, and Weight loss.  ? ?Surgical History:  ? ?Past Surgical History:  ?Procedure Laterality Date  ? COLONOSCOPY N/A 03/14/2014  ? Procedure: COLONOSCOPY;  Surgeon: Rogene Houston, MD;  [facility]: [facility];  Service: Endoscopy;  Laterality: N/A;  730 - moved to 1/6 @ 12:00 - Ann notified pt  ? COLONOSCOPY WITH PROPOFOL N/A 11/04/2020  ? Procedure: COLONOSCOPY WITH PROPOFOL;  Surgeon: Ronnette Juniper, MD;  Location: [facility];  Service: Gastroenterology;  Laterality: N/A;  ? ESOPHAGEAL BANDING  11/04/2020  ? Procedure: ESOPHAGEAL BANDING;  Surgeon: Ronnette Juniper, MD;  Location: [facility];  Service: Gastroenterology;;  ? ESOPHAGOGASTRODUODENOSCOPY N/A 05/13/2021  ? Procedure: ESOPHAGOGASTRODUODENOSCOPY (EGD);  Surgeon: Ronnette Juniper, MD;  Location: [facility];  Service: Gastroenterology;  Laterality: N/A;  ? ESOPHAGOGASTRODUODENOSCOPY (EGD) WITH PROPOFOL N/A 11/04/2020  ? Procedure: ESOPHAGOGASTRODUODENOSCOPY (EGD) WITH PROPOFOL;  Surgeon: Ronnette Juniper, MD;  Location: [facility];  Service: Gastroenterology;  Laterality: N/A;  ? IR PARACENTESIS  07/19/2020  ? NO PAST  SURGERIES    ? POLYPECTOMY  11/04/2020  ? Procedure: POLYPECTOMY;  Surgeon: Ronnette Juniper, MD;  Location: [facility];  Service: Gastroenterology;;  ?  ? ?Social History:  ? reports that he has been smoking cigarettes

## 2021-06-09 NOTE — Progress Notes (Addendum)
eLink Physician-Brief Progress Note ?Patient Name: Eric Dennis ?DOB: [birth year] ?MRN: [id number] ? ? ?Date of Service ? 06/09/2021  ?HPI/Events of Note ? CBG > 180 twice, asking for SSI.  ?eICU Interventions ? Hg up. NPO.  ?Watch for hypoglycemia.   ? ? ? ?Intervention Category ?Intermediate Interventions: Best-practice therapies (e.g. DVT, beta blocker, etc.) ? ?Elmer Sow ?06/09/2021, 3:40 AM ?

## 2021-06-09 NOTE — Progress Notes (Addendum)
[facility] Progress Note ? ?Eric Dennis 56 y.o. 28-Sep-1965 ? ? ?Subjective: ?Intubated and sedated. Wife at bedside. ? ?Objective: ?Vital signs: ?Vitals:  ? 06/09/21 0900 06/09/21 1000  ?BP: 126/63 109/72  ?Pulse: (!) 105 (!) 113  ?Resp: 16 14  ?Temp:    ?SpO2: 99% 99%  ?T 99.4 ? ?Physical Exam: ?Gen: intubated, sedated, well-nourished  ?HEENT: anicteric sclera ?CV: RRR ?Chest: CTA B ?Abd: soft, nondistended, nontender (no facial grimace), +BS ?Ext: no edema ? ?Lab Results: ?Recent Labs  ?  06/08/21 ?0320 06/08/21 ?1025 06/08/21 ?1556 06/09/21 ?0440  ?NA 139 138 135 133*  ?K 3.5 3.9 4.4 3.6  ?CL 90* 94*  --  93*  ?CO2 30 25  --  25  ?GLUCOSE 155* 144*  --  183*  ?BUN 63* 76*  --  103*  ?CREATININE 1.55* 2.20*  --  3.14*  ?CALCIUM 10.4* 9.6  --  8.3*  ?MG 1.4*  --   --  1.8  ? ?Recent Labs  ?  06/08/21 ?1025 06/09/21 ?0440  ?AST 83* 195*  ?ALT 40 57*  ?ALKPHOS 72 54  ?BILITOT 16.6* 20.9*  ?PROT 4.9* 5.3*  ?ALBUMIN 1.9* 2.9*  ? ?Recent Labs  ?  06/08/21 ?0320 06/08/21 ?1556 06/08/21 ?2255 06/09/21 ?0440  ?WBC 11.2*  --  9.4 10.4  ?NEUTROABS 8.6*  --   --   --   ?HGB 7.9*   < > 7.6* 7.2*  ?HCT 22.8*   < > 20.8* 19.3*  ?MCV 122.6*  --  103.0* 102.7*  ?PLT 118*  --  60* 65*  ? < > = values in this interval not displayed.  ? ? ? ? ?Assessment/Plan: ?Decompensated cirrhosis - hepatic encephalopathy, renal failure, and coagulopathy. Doubt variceal bleed and would hold off on EGD unless signs of active bleeding develop. Lactulose enemas (difficult to tell mental status right now with sedation/intubation);   ?Continue Protonix drip. Correct coagulopathy. Follow H/Hs. Supportive care.  ? ?Lear Ng ?06/09/2021, 12:07 PM ? ?Questions please call [phone] Patient ID: Eric Dennis, male   DOB: 1965/05/08, 56 y.o.   MRN: [id number] ? ?

## 2021-06-09 NOTE — Progress Notes (Addendum)
Initial Nutrition Assessment ? ?DOCUMENTATION CODES:  ? ?Not applicable ? ?INTERVENTION:  ? ?Initiate tube feeding via Cortrak: ?Vital 1.5 at 60 ml/h (1440 ml per day) ?Prosource TF 45 ml TID ? ?Provides 2280 kcal, 130 gm protein, 1100 ml free water daily. ? ?MVI with minerals daily via tube.  ? ?NUTRITION DIAGNOSIS:  ? ?Inadequate oral intake related to inability to eat as evidenced by NPO status. ? ?GOAL:  ? ?Patient will meet greater than or equal to 90% of their needs ? ?MONITOR:  ? ?Vent status, TF tolerance, Labs ? ?REASON FOR ASSESSMENT:  ? ?Ventilator, Consult ?Enteral/tube feeding initiation and management ? ?ASSESSMENT:  ? ?56 yo male admitted with AMS, GI bleed, decompensated cirrhosis. PMH includes alcohol use, HLD. ? ?Discussed patient in ICU rounds and with RN today. ?RD to start TF. ?Cortrak tube unable to be placed d/t heavy Cortrak load today. ?RN placing OG tube.  ?Holding off on CRRT today. ?Not currently requiring pressors.  ? ?Per discussion with patient's wife, patient has been eating smaller more frequent meals and snacks at home recently. She has not noticed any weight loss. ? ?Patient is currently intubated on ventilator support ?MV: 10.5 L/min ?Temp (24hrs), Avg:98.7 ?F (37.1 ?C), Min:98 ?F (36.7 ?C), Max:99.4 ?F (37.4 ?C) ? ? ?Labs reviewed. Na 133, ionized calcium 1.05 ?CBG: 159-150 ? ?Medications reviewed and include octreotide, protonix, calcium gluconate, folic acid, novolog, lactulose, solu-medrol, thiamine. ? ?Weight history reviewed. No significant weight changes noted recently.  ? ?NUTRITION - FOCUSED PHYSICAL EXAM: ? ?Flowsheet Row Most Recent Value  ?Orbital Region No depletion  ?Upper Arm Region No depletion  ?Thoracic and Lumbar Region No depletion  ?Buccal Region Unable to assess  ?Temple Region No depletion  ?Clavicle Bone Region Mild depletion  ?Clavicle and Acromion Bone Region No depletion  ?Scapular Bone Region Unable to assess  ?Dorsal Hand No depletion  ?Patellar Region  No depletion  ?Anterior Thigh Region No depletion  ?Posterior Calf Region No depletion  ?Edema (RD Assessment) None  ?Hair Reviewed  ?Eyes Unable to assess  ?Mouth Unable to assess  ?Skin Reviewed  ?Nails Reviewed  ? ?  ? ? ?Diet Order:   ?Diet Order   ? ?       ?  Diet NPO time specified  Diet effective now       ?  ? ?  ?  ? ?  ? ? ?EDUCATION NEEDS:  ? ?Not appropriate for education at this time ? ?Skin:  Skin Assessment: Skin Integrity Issues: ?Skin Integrity Issues:: DTI ?DTI: L elbow ? ?Last BM:  4/3 type 7 (rectal tube) ? ?Height:  ? ?Ht Readings from Last 1 Encounters:  ?06/09/21 '6\' 4"'$  (1.93 m)  ? ? ?Weight:  ? ?Wt Readings from Last 1 Encounters:  ?06/09/21 87.5 kg  ? ? ? ?BMI:  Body mass index is 23.48 kg/m?. ? ?Estimated Nutritional Needs:  ? ?Kcal:  2200-2400 ? ?Protein:  120-140 gm ? ?Fluid:  2.2-2.4 L ? ? ? ?Lucas Mallow RD, LDN, CNSC ?Please refer to Amion for contact information.                                                       ? ?

## 2021-06-09 NOTE — Consult Note (Signed)
[facility]  ?HISTORY AND PHYSICAL ? ?Eric Dennis is an 56 y.o. male.   ? ?Chief Complaint:  hematemesis ? ?HPI: Pt EtOH cirrhosis with varices (banded) came in yesterday for AMS, poor PO intake, and hematemesis.  Intubated and sedated.  Hgb on admission was 5.4, got 2 u pRBCS and 1 u FFP.  GI consulted, following, no immediate plans for scope.  Cr on admission 1.55, is now 3.14 with borderline oliguria.  He is getting PPI, octreotide, levophed, ceftriaxone, and albumin.  Tbili 20.9.   ? ?MELD is > 30.  No family at bedside this AM.  In this setting we are asked to see.  Appears that Lasix and spironolactone are on his med list as well as K supps.  No ACEi/ARB.  Advil also on list.   ? ?PMH: ?Past Medical History:  ?Diagnosis Date  ? Elevated liver function tests   ? High cholesterol   ? Weight loss   ? ?PSH: ?Past Surgical History:  ?Procedure Laterality Date  ? COLONOSCOPY N/A 03/14/2014  ? Procedure: COLONOSCOPY;  Surgeon: Rogene Houston, MD;  [facility];  Service: Endoscopy;  Laterality: N/A;  730 - moved to 1/6 @ 12:00 - Ann notified pt  ? COLONOSCOPY WITH PROPOFOL N/A 11/04/2020  ? Procedure: COLONOSCOPY WITH PROPOFOL;  Surgeon: Ronnette Juniper, MD;  Location: [facility];  Service: Gastroenterology;  Laterality: N/A;  ? ESOPHAGEAL BANDING  11/04/2020  ? Procedure: ESOPHAGEAL BANDING;  Surgeon: Ronnette Juniper, MD;  Location: [facility];  Service: Gastroenterology;;  ? ESOPHAGOGASTRODUODENOSCOPY N/A 05/13/2021  ? Procedure: ESOPHAGOGASTRODUODENOSCOPY (EGD);  Surgeon: Ronnette Juniper, MD;  Location: [facility];  Service: Gastroenterology;  Laterality: N/A;  ? ESOPHAGOGASTRODUODENOSCOPY (EGD) WITH PROPOFOL N/A 11/04/2020  ? Procedure: ESOPHAGOGASTRODUODENOSCOPY (EGD) WITH PROPOFOL;  Surgeon: Ronnette Juniper, MD;  Location: [facility];  Service: Gastroenterology;  Laterality: N/A;  ? IR PARACENTESIS  07/19/2020  ? NO PAST SURGERIES    ? POLYPECTOMY  11/04/2020  ? Procedure: POLYPECTOMY;  Surgeon:  Ronnette Juniper, MD;  Location: [facility];  Service: Gastroenterology;;  ? ? ? ?Past Medical History:  ?Diagnosis Date  ? Elevated liver function tests   ? High cholesterol   ? Weight loss   ? ? ?Medications: ? Scheduled: ? chlorhexidine gluconate (MEDLINE KIT)  15 mL Mouth Rinse BID  ? Chlorhexidine Gluconate Cloth  6 each Topical Daily  ? feeding supplement (PROSource TF)  45 mL Per Tube TID  ? insulin aspart  0-6 Units Subcutaneous Q4H  ? lactulose  300 mL Rectal Daily  ? mouth rinse  15 mL Mouth Rinse 10 times per day  ? methylPREDNISolone (SOLU-MEDROL) injection  40 mg Intravenous Daily  ? [START ON 06/11/2021] pantoprazole  40 mg Intravenous Q12H  ? thiamine injection  100 mg Intravenous Daily  ? ? ?Medications Prior to Admission  ?Medication Sig Dispense Refill  ? calcium carbonate (TUMS - DOSED IN MG ELEMENTAL CALCIUM) 500 MG chewable tablet Chew 2 tablets by mouth 2 (two) times daily as needed for indigestion or heartburn.    ? Cholecalciferol (VITAMIN D3) 125 MCG (5000 UT) TABS Take 5,000 Units by mouth every evening.    ? folic acid (FOLVITE) 1 MG tablet Take 1 tablet (1 mg total) by mouth daily. (Patient taking differently: Take 1 mg by mouth every evening.) 30 tablet 1  ? furosemide (LASIX) 40 MG tablet Take 40 mg by mouth every evening.    ? ibuprofen (ADVIL) 200 MG tablet Take 400 mg  by mouth every 6 (six) hours as needed for headache or moderate pain.    ? ondansetron (ZOFRAN) 8 MG tablet Take 1 tablet (8 mg total) by mouth every 8 (eight) hours as needed for nausea or vomiting. (Patient taking differently: Take 8 mg by mouth See admin instructions. Take 1 tablet (8 mg) by mouth scheduled every night & may take 2 additional dose if needed for nausea/vomiting.) 20 tablet 0  ? pantoprazole (PROTONIX) 40 MG tablet Take 1 tablet (40 mg total) by mouth daily. (Patient taking differently: Take 40 mg by mouth every evening.) 30 tablet 1  ? Potassium 99 MG TABS Take 99 mg by mouth every evening.    ?  spironolactone (ALDACTONE) 100 MG tablet Take 100 mg by mouth every evening.    ? thiamine 100 MG tablet Take 1 tablet (100 mg total) by mouth daily. (Patient taking differently: Take 100 mg by mouth every evening.)    ? vitamin B-12 (CYANOCOBALAMIN) 500 MCG tablet Take 500 mcg by mouth every evening.    ? lactulose (CHRONULAC) 10 GM/15ML solution Take 15 mLs (10 g total) by mouth 2 (two) times daily. (Patient not taking: Reported on 06/08/2021) 946 mL 2  ? ? ?ALLERGIES: ? No Known Allergies ? ?FAM HX: ?Family History  ?Problem Relation Age of Onset  ? Diabetes Mother   ? Heart disease Maternal Grandmother   ? ? ?Social History:  ? reports that he has been smoking cigarettes. He has a 5.00 pack-year smoking history. He has never used smokeless tobacco. He reports current alcohol use of about 14.0 standard drinks per week. He reports that he does not use drugs. ? ?ROS: ?ROS: not able to be obtained d/t intubation ? ?Blood pressure (!) 107/53, pulse (!) 106, temperature 99 ?F (37.2 ?C), temperature source Axillary, resp. rate 16, height '6\' 4"'  (1.93 m), weight 87.5 kg, SpO2 99 %. ?PHYSICAL EXAM: ?Physical Exam ?GEN intubated, sedated ?HEENT + icteric sclera ?NECK no JVD ?PULM coarse bilaterally ?CV tachycardic ?ABD soft, + fluid wave ?EXT no LE edema ? ?Results for orders placed or performed during the hospital encounter of 06/08/21 (from the past 48 hour(s))  ?Comprehensive metabolic panel     Status: Abnormal  ? Collection Time: 06/08/21  3:20 AM  ?Result Value Ref Range  ? Sodium 139 135 - 145 mmol/L  ? Potassium 3.5 3.5 - 5.1 mmol/L  ? Chloride 90 (L) 98 - 111 mmol/L  ? CO2 30 22 - 32 mmol/L  ? Glucose, Bld 155 (H) 70 - 99 mg/dL  ?  Comment: Glucose reference range applies only to samples taken after fasting for at least 8 hours.  ? BUN 63 (H) 6 - 20 mg/dL  ? Creatinine, Ser 1.55 (H) 0.61 - 1.24 mg/dL  ? Calcium 10.4 (H) 8.9 - 10.3 mg/dL  ? Total Protein 5.6 (L) 6.5 - 8.1 g/dL  ? Albumin 2.3 (L) 3.5 - 5.0 g/dL  ? AST  102 (H) 15 - 41 U/L  ? ALT 48 (H) 0 - 44 U/L  ? Alkaline Phosphatase 86 38 - 126 U/L  ? Total Bilirubin 19.0 (HH) 0.3 - 1.2 mg/dL  ?  Comment: CRITICAL RESULT CALLED TO, READ BACK BY AND VERIFIED WITH: ?Janalyn Harder RN 06/08/21 0425 Wiliam Ke ?  ? GFR, Estimated 52 (L) >60 mL/min  ?  Comment: (NOTE) ?Calculated using the CKD-EPI Creatinine Equation (2021) ?  ? Anion gap 19 (H) 5 - 15  ?  Comment: Performed at [facility], [place] [address], [place], [place] [address]  ?CBC with Differential     Status: Abnormal  ? Collection Time: 06/08/21  3:20 AM  ?Result Value Ref Range  ? WBC 11.2 (H) 4.0 - 10.5 K/uL  ? RBC 1.86 (L) 4.22 - 5.81 MIL/uL  ? Hemoglobin 7.9 (L) 13.0 - 17.0 g/dL  ? HCT 22.8 (L) 39.0 - 52.0 %  ? MCV 122.6 (H) 80.0 - 100.0 fL  ? MCH 42.5 (H) 26.0 - 34.0 pg  ? MCHC 34.6 30.0 - 36.0 g/dL  ? RDW 14.5 11.5 - 15.5 %  ? Platelets 118 (L) 150 - 400 K/uL  ?  Comment: Immature Platelet Fraction may be ?clinically indicated, consider ?ordering this additional test ?[id number] ?REPEATED TO VERIFY ?PLATELET COUNT CONFIRMED BY SMEAR ?  ? nRBC 0.2 0.0 - 0.2 %  ? Neutrophils Relative % 77 %  ? Neutro Abs 8.6 (H) 1.7 - 7.7 K/uL  ? Lymphocytes Relative 9 %  ? Lymphs Abs 1.0 0.7 - 4.0 K/uL  ? Monocytes Relative 13 %  ? Monocytes Absolute 1.5 (H) 0.1 - 1.0 K/uL  ? Eosinophils Relative 0 %  ? Eosinophils Absolute 0.0 0.0 - 0.5 K/uL  ? Basophils Relative 0 %  ? Basophils Absolute 0.0 0.0 - 0.1 K/uL  ? WBC Morphology MORPHOLOGY UNREMARKABLE   ? Smear Review PLATELETS APPEAR DECREASED   ? Immature Granulocytes 1 %  ? Abs Immature Granulocytes 0.06 0.00 - 0.07 K/uL  ? Polychromasia PRESENT   ?  Comment: Performed at [facility], [place] [address] [place], [place] [address]  ?Protime-INR     Status: Abnormal  ? Collection Time: 06/08/21  3:20 AM  ?Result Value Ref Range  ? Prothrombin Time 25.9 (H) 11.4 - 15.2 seconds  ? INR 2.4 (H) 0.8 - 1.2  ?  Comment: (NOTE) ?INR goal varies based on device and disease  states. ?Performed at [facility], [place] [address] [place], [place] ?[address] ?  ?Lipase, blood     Status: None  ? Collection Time: 06/08/21  3:20 AM  ?Result Value Ref Range  ? Lipase 50 11 - 51 U/L  ?  Comment

## 2021-06-10 DIAGNOSIS — K7682 Hepatic encephalopathy: Secondary | ICD-10-CM | POA: Diagnosis not present

## 2021-06-10 DIAGNOSIS — D689 Coagulation defect, unspecified: Secondary | ICD-10-CM | POA: Diagnosis not present

## 2021-06-10 DIAGNOSIS — K922 Gastrointestinal hemorrhage, unspecified: Secondary | ICD-10-CM | POA: Diagnosis not present

## 2021-06-10 LAB — CBC WITH DIFFERENTIAL/PLATELET
Abs Immature Granulocytes: 0.15 10*3/uL — ABNORMAL HIGH (ref 0.00–0.07)
Basophils Absolute: 0 10*3/uL (ref 0.0–0.1)
Basophils Relative: 0 %
Eosinophils Absolute: 0 10*3/uL (ref 0.0–0.5)
Eosinophils Relative: 0 %
HCT: 19.3 % — ABNORMAL LOW (ref 39.0–52.0)
Hemoglobin: 6.9 g/dL — CL (ref 13.0–17.0)
Immature Granulocytes: 2 %
Lymphocytes Relative: 10 %
Lymphs Abs: 1 10*3/uL (ref 0.7–4.0)
MCH: 37.9 pg — ABNORMAL HIGH (ref 26.0–34.0)
MCHC: 35.8 g/dL (ref 30.0–36.0)
MCV: 106 fL — ABNORMAL HIGH (ref 80.0–100.0)
Monocytes Absolute: 2.5 10*3/uL — ABNORMAL HIGH (ref 0.1–1.0)
Monocytes Relative: 25 %
Neutro Abs: 6.4 10*3/uL (ref 1.7–7.7)
Neutrophils Relative %: 63 %
Platelets: 68 10*3/uL — ABNORMAL LOW (ref 150–400)
RBC: 1.82 MIL/uL — ABNORMAL LOW (ref 4.22–5.81)
WBC: 10 10*3/uL (ref 4.0–10.5)
nRBC: 2.5 % — ABNORMAL HIGH (ref 0.0–0.2)

## 2021-06-10 LAB — COMPREHENSIVE METABOLIC PANEL
ALT: 69 U/L — ABNORMAL HIGH (ref 0–44)
AST: 217 U/L — ABNORMAL HIGH (ref 15–41)
Albumin: 3 g/dL — ABNORMAL LOW (ref 3.5–5.0)
Alkaline Phosphatase: 52 U/L (ref 38–126)
Anion gap: 13 (ref 5–15)
BUN: 113 mg/dL — ABNORMAL HIGH (ref 6–20)
CO2: 26 mmol/L (ref 22–32)
Calcium: 8.1 mg/dL — ABNORMAL LOW (ref 8.9–10.3)
Chloride: 100 mmol/L (ref 98–111)
Creatinine, Ser: 2.11 mg/dL — ABNORMAL HIGH (ref 0.61–1.24)
GFR, Estimated: 36 mL/min — ABNORMAL LOW (ref >60)
Glucose, Bld: 221 mg/dL — ABNORMAL HIGH (ref 70–99)
Potassium: 3.6 mmol/L (ref 3.5–5.1)
Sodium: 139 mmol/L (ref 135–145)
Total Bilirubin: 22.6 mg/dL (ref 0.3–1.2)
Total Protein: 5.4 g/dL — ABNORMAL LOW (ref 6.5–8.1)

## 2021-06-10 LAB — GLUCOSE, CAPILLARY
Glucose-Capillary: 190 mg/dL — ABNORMAL HIGH (ref 70–99)
Glucose-Capillary: 227 mg/dL — ABNORMAL HIGH (ref 70–99)
Glucose-Capillary: 237 mg/dL — ABNORMAL HIGH (ref 70–99)
Glucose-Capillary: 263 mg/dL — ABNORMAL HIGH (ref 70–99)
Glucose-Capillary: 296 mg/dL — ABNORMAL HIGH (ref 70–99)
Glucose-Capillary: 317 mg/dL — ABNORMAL HIGH (ref 70–99)

## 2021-06-10 LAB — HEMOGLOBIN AND HEMATOCRIT, BLOOD
HCT: 20.7 % — ABNORMAL LOW (ref 39.0–52.0)
Hemoglobin: 7.4 g/dL — ABNORMAL LOW (ref 13.0–17.0)

## 2021-06-10 LAB — HEMOGLOBIN A1C
Hgb A1c MFr Bld: 4.6 % — ABNORMAL LOW (ref 4.8–5.6)
Mean Plasma Glucose: 85 mg/dL

## 2021-06-10 LAB — PHOSPHORUS: Phosphorus: 4.2 mg/dL (ref 2.5–4.6)

## 2021-06-10 LAB — PREPARE RBC (CROSSMATCH)

## 2021-06-10 LAB — MAGNESIUM: Magnesium: 2.1 mg/dL (ref 1.7–2.4)

## 2021-06-10 MED ORDER — PANTOPRAZOLE SODIUM 40 MG IV SOLR
40.0000 mg | Freq: Two times a day (BID) | INTRAVENOUS | Status: DC
  Administered 2021-06-10 – 2021-06-11 (×4): 40 mg via INTRAVENOUS
  Filled 2021-06-10 (×4): qty 10

## 2021-06-10 MED ORDER — INSULIN ASPART 100 UNIT/ML IJ SOLN
0.0000 [IU] | INTRAMUSCULAR | Status: DC
  Administered 2021-06-10: 5 [IU] via SUBCUTANEOUS
  Administered 2021-06-10: 11 [IU] via SUBCUTANEOUS
  Administered 2021-06-10 (×2): 8 [IU] via SUBCUTANEOUS
  Administered 2021-06-11: 5 [IU] via SUBCUTANEOUS
  Administered 2021-06-11: 8 [IU] via SUBCUTANEOUS
  Administered 2021-06-11: 11 [IU] via SUBCUTANEOUS
  Administered 2021-06-11: 5 [IU] via SUBCUTANEOUS
  Administered 2021-06-11: 11 [IU] via SUBCUTANEOUS
  Administered 2021-06-12 (×2): 8 [IU] via SUBCUTANEOUS
  Administered 2021-06-12: 5 [IU] via SUBCUTANEOUS
  Administered 2021-06-12 (×2): 8 [IU] via SUBCUTANEOUS
  Administered 2021-06-12: 5 [IU] via SUBCUTANEOUS
  Administered 2021-06-12: 3 [IU] via SUBCUTANEOUS
  Administered 2021-06-13 (×2): 2 [IU] via SUBCUTANEOUS
  Administered 2021-06-13 – 2021-06-14 (×4): 3 [IU] via SUBCUTANEOUS
  Administered 2021-06-14: 5 [IU] via SUBCUTANEOUS
  Administered 2021-06-14: 3 [IU] via SUBCUTANEOUS
  Administered 2021-06-14 (×2): 2 [IU] via SUBCUTANEOUS
  Administered 2021-06-15: 3 [IU] via SUBCUTANEOUS
  Administered 2021-06-15 (×2): 2 [IU] via SUBCUTANEOUS
  Administered 2021-06-16: 8 [IU] via SUBCUTANEOUS
  Administered 2021-06-16: 2 [IU] via SUBCUTANEOUS
  Administered 2021-06-16: 5 [IU] via SUBCUTANEOUS
  Administered 2021-06-16 – 2021-06-17 (×2): 2 [IU] via SUBCUTANEOUS

## 2021-06-10 MED ORDER — THIAMINE HCL 100 MG PO TABS
100.0000 mg | ORAL_TABLET | Freq: Every day | ORAL | Status: DC
  Administered 2021-06-11 – 2021-06-13 (×3): 100 mg
  Filled 2021-06-10 (×3): qty 1

## 2021-06-10 MED ORDER — ADULT MULTIVITAMIN W/MINERALS CH
1.0000 | ORAL_TABLET | Freq: Every day | ORAL | Status: DC
  Administered 2021-06-10 – 2021-06-13 (×4): 1
  Filled 2021-06-10 (×4): qty 1

## 2021-06-10 MED ORDER — FOLIC ACID 1 MG PO TABS
1.0000 mg | ORAL_TABLET | Freq: Every day | ORAL | Status: DC
  Administered 2021-06-11 – 2021-06-13 (×3): 1 mg
  Filled 2021-06-10 (×3): qty 1

## 2021-06-10 MED ORDER — NEPRO/CARBSTEADY PO LIQD
1000.0000 mL | ORAL | Status: DC
  Filled 2021-06-10: qty 1000

## 2021-06-10 MED ORDER — POTASSIUM CHLORIDE 20 MEQ PO PACK
40.0000 meq | PACK | Freq: Once | ORAL | Status: AC
  Administered 2021-06-10: 40 meq
  Filled 2021-06-10: qty 2

## 2021-06-10 MED ORDER — RIFAXIMIN 550 MG PO TABS
550.0000 mg | ORAL_TABLET | Freq: Two times a day (BID) | ORAL | Status: DC
  Administered 2021-06-10 – 2021-06-13 (×7): 550 mg
  Filled 2021-06-10 (×7): qty 1

## 2021-06-10 MED ORDER — VITAL 1.5 CAL PO LIQD
1000.0000 mL | ORAL | Status: DC
  Administered 2021-06-10 – 2021-06-12 (×3): 1000 mL

## 2021-06-10 MED ORDER — SODIUM CHLORIDE 0.9% IV SOLUTION: Freq: Once | INTRAVENOUS | Status: DC

## 2021-06-10 MED ORDER — INSULIN GLARGINE-YFGN 100 UNIT/ML ~~LOC~~ SOLN
10.0000 [IU] | Freq: Every day | SUBCUTANEOUS | Status: DC
Start: 2021-06-10 — End: 2021-06-11
  Administered 2021-06-10: 10 [IU] via SUBCUTANEOUS
  Filled 2021-06-10 (×2): qty 0.1

## 2021-06-10 MED ORDER — LACTULOSE 10 GM/15ML PO SOLN
30.0000 g | Freq: Two times a day (BID) | ORAL | Status: DC
  Administered 2021-06-10 – 2021-06-11 (×4): 30 g
  Filled 2021-06-10 (×4): qty 45

## 2021-06-10 NOTE — Progress Notes (Signed)
eLink Physician-Brief Progress Note ?Patient Name: Eric Dennis ?DOB: [birth year] ?MRN: [id number] ? ? ?Date of Service ? 06/10/2021  ?HPI/Events of Note ? Anemia - Hgb = 6.9.   ?eICU Interventions ? Will transfuse 1 unit PRBC.  ? ? ? ?Intervention Category ?Major Interventions: Other: ? ?Rogelio Winbush Cornelia Copa ?06/10/2021, 6:51 AM ?

## 2021-06-10 NOTE — Progress Notes (Signed)
RT note. ?Patient flipped to SBT 8/5 40% sat 99% no labored breathing noted. RT will continue to monitor.  ?

## 2021-06-10 NOTE — Progress Notes (Signed)
? ?NAME:  Eric Dennis, MRN:  [id number], DOB:  11-Apr-1965, LOS: 2 ?ADMISSION DATE:  06/08/2021, CONSULTATION DATE:  06/08/21 ?REFERRING MD:  Glick-TRH, CHIEF COMPLAINT:  AMS ?History of Present Illness:  ?56 year old male with past medical hx of alcohol use c/b alcoholic cirrhosis and prior esophageal varices s/p banding, presented to [facility] ED on 06/08/21 with AMS with family reporting half a week hx of worsening alteration in mental status with concomitant low PO intake. Patient hypotensive in the ED. Lab work showed patient having elevated LFTs, INR, and ammonia, along with AKI, and electrolyte abnormalities. Imaging negative for head injury or evidence of pneumonia.  ?Other Pertinent Medical History  ?Anemia ?Thrombocytopenia ?Alcohol use disorder ?Significant Hospital Events: ?Including procedures, antibiotic start and stop dates in addition to other pertinent events   ?04/02 Presented to the ED with worsening AMS and difficulty protecting the airway. Patient intubated. Transferred to ICU given acute risk for decompensation. AKI improved with IVF. ?04/03 Nephrology consulted for worsening AKI 2.20-->3.14.  ?04/04 Pt had hgb drop less to than 7 requiring 1 unit pRBC.  ?Interim History / Subjective:  ?Hgb dropped from 7.2 to 6.9. Patient opens and closes eyes but does not follow commands.  ?Review of Systems:   ?Unable to perform as patient is sedated and intubated.  ?Objective:   Improving with MAP>70 and no hypotensive episodes.   ?Blood pressure 125/60, pulse 99, temperature 98.7 ?F (37.1 ?C), temperature source Oral, resp. rate 17, height '6\' 4"'$  (1.93 m), weight 85.7 kg, SpO2 100 %. ?   ?Vent Mode: PRVC ?FiO2 (%):  [40 %] 40 % ?Set Rate:  [16 bmp] 16 bmp ?Vt Set:  [690 mL] 690 mL ?PEEP:  [5 cmH20] 5 cmH20 ?Plateau Pressure:  [14 cmH20-19 cmH20] 19 cmH20  ? ?Intake/Output Summary (Last 24 hours) at 06/10/2021 0707 ?Last data filed at 06/10/2021 0600 ?Gross per 24 hour  ?Intake 1894.69 ml  ?Output 2650 ml  ?Net -755.31  ml  ?Urine output 2.35 L ? ?Filed Weights  ? 06/09/21 0424 06/10/21 0447  ?Weight: 87.5 kg 85.7 kg  ?Examination: ?General: laying comfortably in NAD. Opens and closes eyes. ?HENT: Pupils reactive to light. Patient intubated ?Lungs: CTAB ?Cardiovascular: NSR, 2+ pulses in all extremities, no LE edema ?Abdomen: No TTP, slightly distended ?Extremities: No asymmetry, good muscle bulk and tone  ?Neuro: opens and closes eyes but not on command. Did follow command but not reproducible.  ?Consults  ?Nephrology following; appreciate their assistance in taking care of this patient.  ?GI following; appreciate their assistance in taking care of this patient.  ?Resolved Problems  ?Hyponatremia ?Assessment & Plan:  ?Acute Hepatic Encephalopathy ?Decompensated Alcoholic cirrhosis ?Acute Alcoholic hepatitis ?Coagulopathy ?Thrombocytopenia ?Hyperbilirubinemia ?Hyperammonemia ?MELD-Na score 34 with 65 % estimated 90 day mortality. MDF 96.8 suggesting poor prognosis. Has recent alcohol use. S/p 1 FFP for coagulopathy. Albumin stable at 3.0. Thrombocytopenia stable.  ?-Switch lactulose enema to '30mg'$  PO BID for hyperammonemia. IV rocephin 1g qd for SBP ppx (Day 3). Continue Solumedrol 40 mg qd (Day 3) for alcoholic hepatitis. Appears to be contributing to elevated BUN.  ?-Cor-trak placed; receiving enteral feeding. Plan to start PO rifaximin 550 mg BID today.  ?-CTM CMP, INR ?Hypovolemic shock ?2/2 to problem 1. Improving with MAP>70 and no hypotensive episodes. LR stopped. MAP goal is >75 given hepatic and liver pathology.  ?-Patient receiving feeding supplement at 60 cc/hr.  ?-CTM ?Acute Kidney Injury 2/2 to ischemic ATN ?Improved with improved perfusion. Cr 3.14-->2.11.  ?-Nephrology  following. HRS less likely given improvement with fluids but patient still at high risk due to severe hepatic disease. Albumin discontinued as patient does not have HRS.  ?-CTM renal fxn ?-Renally dose med and avoid nephrotoxic meds ?GIB with reported  hematemesis ?Hx of esophageal bleed ?Hx of anemia  ?Given hx of esophageal bleed and reported hematemesis, GI consulted and following. No endoscopy recommended given recent endoscopy showing no large varices. GI will consider endoscopy if pt rebleeds and INR is </=1.7.  Slight drop in hgb from 7.2-->6.9 requiring 1 unit RBC transfusion. Less concern for GI bleed given slight decrease in hgb. BUN elevation appears 2/2 to Solumedrol use.  ?-Continue empiric abx. Switch Protonix to 40 mg BID.  ?-Transfuse with hgb goal of >7. Follow up H/H after current transfusion.  ?-CTM. Call [facility] if pt re-bleeds. ?Protein calorie malnutrition due to alcoholism ?Failure to thrive in adults ?2/2 alcohol use disorder and hepatic cirrhosis. Cor-trak placed and patient receiving tube feeds at 60 cc/hr abd feeding supplement 45 ml TID.  ?-Continue enteral nutrition at 60cc/hr and the feeding supplement 45 ml TID.  ?Ventricle trigeminy ?Prolonged qtc ?Electrolyte abnormalities ?K and mag wnl on 04/04. Replete as needed for K goal of 4 and Mag goal of 2. ?-Will replete K as it is 3.6 with PO 40 mEQ K.  ?-CTM ?Best Practice (right click and "Reselect all SmartList Selections" daily)  ?Diet/type: Enteral feeds ?DVT prophylaxis: SCD ?GI prophylaxis: PPI ?Lines: Central line ?Continuous IV: None ?Foley:  N/A ?Code Status:  full code ?Last update of family at bedside [04/04] ?Labs   ?CBC: ?Recent Labs  ?Lab 06/08/21 ?0320 06/08/21 ?1556 06/08/21 ?1611 06/08/21 ?2255 06/09/21 ?3295 06/10/21 ?0448  ?WBC 11.2*  --   --  9.4 10.4 10.0  ?NEUTROABS 8.6*  --   --   --   --  6.4  ?HGB 7.9* 5.4* 5.7* 7.6* 7.2* 6.9*  ?HCT 22.8* 16.0* 16.5* 20.8* 19.3* 19.3*  ?MCV 122.6*  --   --  103.0* 102.7* 106.0*  ?PLT 118*  --   --  60* 65* 68*  ?Basic Metabolic Panel: ?Recent Labs  ?Lab 06/08/21 ?0320 06/08/21 ?1025 06/08/21 ?1556 06/09/21 ?1884 06/10/21 ?0448  ?NA 139 138 135 133* 139  ?K 3.5 3.9 4.4 3.6 3.6  ?CL 90* 94*  --  93* 100  ?CO2 30 25  --  25 26   ?GLUCOSE 155* 144*  --  183* 221*  ?BUN 63* 76*  --  103* 113*  ?CREATININE 1.55* 2.20*  --  3.14* 2.11*  ?CALCIUM 10.4* 9.6  --  8.3* 8.1*  ?MG 1.4*  --   --  1.8 2.1  ?PHOS  --   --   --   --  4.2  ?GFR: ?Estimated Creatinine Clearance: 46.8 mL/min (A) (by C-G formula based on SCr of 2.11 mg/dL (H)). ?Recent Labs  ?Lab 06/08/21 ?0320 06/08/21 ?2255 06/09/21 ?1660 06/10/21 ?0448  ?WBC 11.2* 9.4 10.4 10.0  ?Liver Function Tests: ?Recent Labs  ?Lab 06/08/21 ?0320 06/08/21 ?1025 06/09/21 ?6301 06/10/21 ?0448  ?AST 102* 83* 195* 217*  ?ALT 48* 40 57* 69*  ?ALKPHOS 86 72 54 52  ?BILITOT 19.0* 16.6* 20.9* 22.6*  ?PROT 5.6* 4.9* 5.3* 5.4*  ?ALBUMIN 2.3* 1.9* 2.9* 3.0*  ? ?Recent Labs  ?Lab 06/08/21 ?0320  ?LIPASE 50  ? ?Recent Labs  ?Lab 06/08/21 ?0320  ?AMMONIA 112*  ?ABG ?   ?Component Value Date/Time  ? PHART 7.550 (H) 06/08/2021 1556  ?  PCO2ART 31.4 (L) 06/08/2021 1556  ? PO2ART 443 (H) 06/08/2021 1556  ? HCO3 27.5 06/08/2021 1556  ? TCO2 28 06/08/2021 1556  ? O2SAT 100 06/08/2021 1556  ?Coagulation Profile: ?Recent Labs  ?Lab 06/08/21 ?0320 06/09/21 ?0440  ?INR 2.4* 2.7*  ?Cardiac Enzymes: ?No results for input(s): CKTOTAL, CKMB, CKMBINDEX, TROPONINI in the last 168 hours. ?HbA1C: ?Hgb A1c MFr Bld  ?Date/Time Value Ref Range Status  ?10/08/2018 05:28 AM 5.7 (H) 4.8 - 5.6 % Final  ?  Comment:  ?  (NOTE) ?Pre diabetes:          5.7%-6.4% ?Diabetes:              >6.4% ?Glycemic control for   <7.0% ?adults with diabetes ?  ?CBG: ?Recent Labs  ?Lab 06/09/21 ?1218 06/09/21 ?1700 06/09/21 ?1912 06/09/21 ?2352 06/10/21 ?0328  ?GLUCAP 150* 189* 204* 213* 190*  ? ?Past Medical History:  ?He,  has a past medical history of Elevated liver function tests, High cholesterol, and Weight loss.  ? ?Surgical History:  ? ?Past Surgical History:  ?Procedure Laterality Date  ? COLONOSCOPY N/A 03/14/2014  ? Procedure: COLONOSCOPY;  Surgeon: Rogene Houston, MD;  [facility]: [facility];  Service: Endoscopy;  Laterality: N/A;  730 - moved to  1/6 @ 12:00 - Ann notified pt  ? COLONOSCOPY WITH PROPOFOL N/A 11/04/2020  ? Procedure: COLONOSCOPY WITH PROPOFOL;  Surgeon: Ronnette Juniper, MD;  Location: [facility];  Service: Gastroenterology;  Lateral

## 2021-06-10 NOTE — Progress Notes (Signed)
Eric Dennis 56 y.o. 1965-11-26 ? ?CC:  Decompensated Cirrhosis ? ? ?Subjective: ?Patient is intubated and sedated. No family at bedside. Per RN report and flowsheet review patient is having dark brown stools ? ?ROS : Review of Systems  ?Unable to perform ROS: Acuity of condition   ? ? ?Objective: ?Vital signs in last 24 hours: ?Vitals:  ? 06/10/21 0800 06/10/21 0815  ?BP: (!) 117/51 (!) 120/54  ?Pulse: 96 94  ?Resp: 16 16  ?Temp: 98.1 ?F (36.7 ?C) 98.4 ?F (36.9 ?C)  ?SpO2: 100% 100%  ? ? ?Physical Exam: ? ?General:  Intubated, sedated, well-nourished  ?Head:  Normocephalic, without obvious abnormality, atraumatic  ?Eyes:  icteric sclera  ?Lungs:   Bilateral ventilated breath sounds  ?Heart:  Regular rate and rhythm, S1, S2 normal  ?Abdomen:   Soft, non-tender (no facial grimace), bowel sounds active all four quadrants,  no masses,   ?Extremities: Extremities normal, atraumatic, no  edema  ? ? ?Lab Results: ?Recent Labs  ?  06/09/21 ?1478 06/10/21 ?0448  ?NA 133* 139  ?K 3.6 3.6  ?CL 93* 100  ?CO2 25 26  ?GLUCOSE 183* 221*  ?BUN 103* 113*  ?CREATININE 3.14* 2.11*  ?CALCIUM 8.3* 8.1*  ?MG 1.8 2.1  ?PHOS  --  4.2  ? ?Recent Labs  ?  06/09/21 ?2956 06/10/21 ?0448  ?AST 195* 217*  ?ALT 57* 69*  ?ALKPHOS 54 52  ?BILITOT 20.9* 22.6*  ?PROT 5.3* 5.4*  ?ALBUMIN 2.9* 3.0*  ? ?Recent Labs  ?  06/08/21 ?0320 06/08/21 ?1556 06/09/21 ?2130 06/10/21 ?0448  ?WBC 11.2*   < > 10.4 10.0  ?NEUTROABS 8.6*  --   --  6.4  ?HGB 7.9*   < > 7.2* 6.9*  ?HCT 22.8*   < > 19.3* 19.3*  ?MCV 122.6*   < > 102.7* 106.0*  ?PLT 118*   < > 65* 68*  ? < > = values in this interval not displayed.  ? ?Recent Labs  ?  06/08/21 ?0320 06/09/21 ?0440  ?LABPROT 25.9* 28.5*  ?INR 2.4* 2.7*  ? ? ? ? ?Assessment ?Decompensated Cirrhosis; hepatic encephalopathy, renal failure, and coagulopathy ?- hgb 6.9 ?- platelets 68 ?- AST 217/ALT 69/alk phos 52 ?-Total bilirubin 22.6 ?- MELD score 4/4: 36 ?- INR 2.7 ? ?Plan: ?No  overt bleeding at this time. Will continue to hold off on EGD unless signs of active bleeding develop. ?Continue lactulose enemas ?Continue Protonix drips ?Continue to correct coagulopathy ?Continue daily CBC and transfuse as needed to maintain HGB > 7  ?Continue supportive care ?[facility] will follow ? ?Yavier Snider Radford Pax PA-C ?06/10/2021, 9:19 AM ? ?Contact #  [phone]  ?

## 2021-06-10 NOTE — Progress Notes (Signed)
?[facility] ?Progress Note  ? ? ?Assessment/ Plan:   ?Decompensated hepatic cirrhosis: d/t EtOH.  Getting steroids, PPI, albumin, ceftriaxone, octreotide, levophed.  GI following.  D/w wife- wasn't taking his lactulose at home.  He is getting here. ?AKI: in the setting of shock with severe anemia +/- sepsis- ATN vs HRS, encouraged since he's making urine now--> had a pretty severe kidney injury, hopefully we can squeak by without dialysis as he is a poor candidate short or long term.  Getting appropriate rx for HRS as above in #1.  Improving today with increased UOP.  BUN up in the setting of GI bleed, steroids, TF--> will switch to nepro ?Hepatic encephalopathy- getting lactulose ?EtOH abuse- thiamine, folate, per PCCM ?Acute hypoxic RF: vented  ?Hematemesis: GI following, transfuse prn, no immediate plans for scope ?Dispo: poor prognosis ? ?Subjective:   ? ?Urine output improving, Cr down.  BUN is up- on steroids, had hematemesis, and is on high osm TF.    ? ?Objective:   ?BP 119/61   Pulse 94   Temp 98.4 ?F (36.9 ?C) (Axillary)   Resp 16   Ht _0  (1.93 m)   Wt 85.7 kg   SpO2 100%   BMI 23.00 kg/m?  ? ?Intake/Output Summary (Last 24 hours) at 06/10/2021 1054 ?Last data filed at 06/10/2021 1000 ?Gross per 24 hour  ?Intake 2091.46 ml  ?Output 2975 ml  ?Net -883.54 ml  ? ?Weight change: -1.8 kg ? ?Physical Exam: ?GEN intubated, sedated ?HEENT + icteric sclera, + temporal wasting ?NECK no JVD ?PULM coarse bilaterally ?CV tachycardic ?ABD soft, + fluid wave ?EXT no LE edema ? ?Imaging: ?DG Abd 1 View ? ?Result Date: 06/09/2021 ?CLINICAL DATA:  Orogastric tube placement. EXAM: ABDOMEN - 1 VIEW COMPARISON:  June 08, 2021. FINDINGS: Distal tip of nasogastric tube is seen in expected position of proximal stomach. IMPRESSION: Distal tip of nasogastric tube seen in expected position of proximal stomach. Electronically Signed   By: Marijo Conception M.D.   On: 06/09/2021 16:24  ? ?Portable Chest  x-ray ? ?Result Date: 06/08/2021 ?CLINICAL DATA:  Endotracheal tube placement. EXAM: PORTABLE CHEST 1 VIEW COMPARISON:  Chest x-ray 08/25/2020. FINDINGS: Endotracheal tube tip is 4.4 cm above the carina. The lungs are clear. There is no pleural effusion or pneumothorax. Cardiomediastinal silhouette is within normal limits. Osseous structures are within normal limits. IMPRESSION: 1. Endotracheal tube tip 4.4 cm above carina. 2. Lungs are clear. Electronically Signed   By: Ronney Asters M.D.   On: 06/08/2021 19:16  ? ?US Abdomen Limited RUQ (LIVER/GB) ? ?Result Date: 06/08/2021 ?CLINICAL DATA:  Hyperbilirubinemia.  Cirrhosis EXAM: ULTRASOUND ABDOMEN LIMITED RIGHT UPPER QUADRANT COMPARISON:  04/09/2021 FINDINGS: Gallbladder: Biliary sludge and multiple small stones within the liver. Gallbladder wall thickness of 3.5 mm. No sonographic Murphy sign noted by sonographer. Common bile duct: Diameter: 3.5 mm. Liver: Coarsened hepatic echotexture with heterogeneously echogenic parenchyma. No focal liver lesion is identified sonographically. Slightly nodular hepatic surface contour. Portal vein is patent on color Doppler imaging with turbulent-appearing hepatopetal flow. Other: Paraumbilical varices again noted. No ascites evident in the right upper quadrant. IMPRESSION: 1. Cirrhotic hepatic morphology. Turbulent hepatopetal flow within the portal vein and paraumbilical varices suggest portal hypertension. 2. Cholelithiasis. Mild gallbladder thickening may be related to underlying liver disease. No sonographic Murphy sign to suggest acute cholecystitis. Electronically Signed   By: Davina Poke D.O.   On: 06/08/2021 12:18   ? ?Labs: ?BMET ?Recent Labs  ?Lab 06/08/21 ?  0320 06/08/21 ?1025 06/08/21 ?1556 06/09/21 ?2683 06/10/21 ?0448  ?NA 139 138 135 133* 139  ?K 3.5 3.9 4.4 3.6 3.6  ?CL 90* 94*  --  93* 100  ?CO2 30 25  --  25 26  ?GLUCOSE 155* 144*  --  183* 221*  ?BUN 63* 76*  --  103* 113*  ?CREATININE 1.55* 2.20*  --  3.14*  2.11*  ?CALCIUM 10.4* 9.6  --  8.3* 8.1*  ?PHOS  --   --   --   --  4.2  ? ?CBC ?Recent Labs  ?Lab 06/08/21 ?0320 06/08/21 ?1556 06/08/21 ?1611 06/08/21 ?2255 06/09/21 ?4196 06/10/21 ?0448  ?WBC 11.2*  --   --  9.4 10.4 10.0  ?NEUTROABS 8.6*  --   --   --   --  6.4  ?HGB 7.9*   < > 5.7* 7.6* 7.2* 6.9*  ?HCT 22.8*   < > 16.5* 20.8* 19.3* 19.3*  ?MCV 122.6*  --   --  103.0* 102.7* 106.0*  ?PLT 118*  --   --  60* 65* 68*  ? < > = values in this interval not displayed.  ? ? ?Medications:   ? ? sodium chloride   Intravenous Once  ? chlorhexidine gluconate (MEDLINE KIT)  15 mL Mouth Rinse BID  ? Chlorhexidine Gluconate Cloth  6 each Topical Daily  ? feeding supplement (PROSource TF)  45 mL Per Tube TID  ? folic acid  1 mg Per Tube Daily  ? insulin aspart  0-15 Units Subcutaneous Q4H  ? insulin glargine-yfgn  10 Units Subcutaneous Daily  ? lactulose  30 g Per Tube BID  ? mouth rinse  15 mL Mouth Rinse 10 times per day  ? methylPREDNISolone (SOLU-MEDROL) injection  40 mg Intravenous Daily  ? multivitamin with minerals  1 tablet Per Tube Daily  ? pantoprazole  40 mg Intravenous Q12H  ? rifaximin  550 mg Per Tube BID  ? thiamine  100 mg Per Tube Daily  ? ? ? ? ?Madelon Lips MD ?06/10/2021, 10:54 AM   ?

## 2021-06-10 NOTE — TOC Progression Note (Signed)
Transition of Care (TOC) - Progression Note  ? ? ?Patient Details  ?Name: Eric Dennis ?MRN: [id number] ?Date of Birth: 03/16/1965 ? ?Transition of Care (TOC) CM/SW Contact  ?Angelita Ingles, RN ?Phone [phone] ? ?06/10/2021, 4:00 PM ? ?Clinical Narrative:    ? ?Transition of Care (TOC) Screening Note ? ? ?Patient Details  ?Name: Eric Dennis ?Date of Birth: 01-11-1966 ? ? ?Transition of Care (TOC) CM/SW Contact:    ?Angelita Ingles, RN ?Phone Number: ?06/10/2021, 4:01 PM ? ? ? ?Transition of Care Department [facility]) has reviewed patient and no TOC needs have been identified at this time. We will continue to monitor patient advancement through interdisciplinary progression rounds.  ?  ? ? ?  ?  ? ?Expected Discharge Plan and Services ?  ?  ?  ?  ?  ?                ?  ?  ?  ?  ?  ?  ?  ?  ?  ?  ? ? ?Social Determinants of Health (SDOH) Interventions ?  ? ?Readmission Risk Interventions ?   ? View : No data to display.  ?  ?  ?  ? ? ?

## 2021-06-11 ENCOUNTER — Inpatient Hospital Stay (HOSPITAL_COMMUNITY): Payer: Medicaid Other

## 2021-06-11 LAB — POCT I-STAT 7, (LYTES, BLD GAS, ICA,H+H)
Acid-Base Excess: 9 mmol/L — ABNORMAL HIGH (ref 0.0–2.0)
Bicarbonate: 31.9 mmol/L — ABNORMAL HIGH (ref 20.0–28.0)
Calcium, Ion: 1.3 mmol/L (ref 1.15–1.40)
HCT: 20 % — ABNORMAL LOW (ref 39.0–52.0)
Hemoglobin: 6.8 g/dL — CL (ref 13.0–17.0)
O2 Saturation: 99 %
Patient temperature: 99.2
Potassium: 4 mmol/L (ref 3.5–5.1)
Sodium: 152 mmol/L — ABNORMAL HIGH (ref 135–145)
TCO2: 33 mmol/L — ABNORMAL HIGH (ref 22–32)
pCO2 arterial: 35.3 mmHg (ref 32–48)
pH, Arterial: 7.565 — ABNORMAL HIGH (ref 7.35–7.45)
pO2, Arterial: 123 mmHg — ABNORMAL HIGH (ref 83–108)

## 2021-06-11 LAB — CBC WITH DIFFERENTIAL/PLATELET
Abs Immature Granulocytes: 0.31 10*3/uL — ABNORMAL HIGH (ref 0.00–0.07)
Basophils Absolute: 0 10*3/uL (ref 0.0–0.1)
Basophils Relative: 0 %
Eosinophils Absolute: 0 10*3/uL (ref 0.0–0.5)
Eosinophils Relative: 0 %
HCT: 21.2 % — ABNORMAL LOW (ref 39.0–52.0)
Hemoglobin: 7.6 g/dL — ABNORMAL LOW (ref 13.0–17.0)
Immature Granulocytes: 3 %
Lymphocytes Relative: 8 %
Lymphs Abs: 0.9 10*3/uL (ref 0.7–4.0)
MCH: 37.8 pg — ABNORMAL HIGH (ref 26.0–34.0)
MCHC: 35.8 g/dL (ref 30.0–36.0)
MCV: 105.5 fL — ABNORMAL HIGH (ref 80.0–100.0)
Monocytes Absolute: 3.7 10*3/uL — ABNORMAL HIGH (ref 0.1–1.0)
Monocytes Relative: 33 %
Neutro Abs: 6.5 10*3/uL (ref 1.7–7.7)
Neutrophils Relative %: 56 %
Platelets: 71 10*3/uL — ABNORMAL LOW (ref 150–400)
RBC: 2.01 MIL/uL — ABNORMAL LOW (ref 4.22–5.81)
WBC: 11.4 10*3/uL — ABNORMAL HIGH (ref 4.0–10.5)
nRBC: 2.4 % — ABNORMAL HIGH (ref 0.0–0.2)

## 2021-06-11 LAB — BPAM RBC
Blood Product Expiration Date: 202304092359
Blood Product Expiration Date: 202304252359
Blood Product Expiration Date: 202305022359
ISSUE DATE / TIME: 202304021700
ISSUE DATE / TIME: 202304021955
ISSUE DATE / TIME: 202304040752
Unit Type and Rh: 6200
Unit Type and Rh: 6200
Unit Type and Rh: 9500

## 2021-06-11 LAB — GLUCOSE, CAPILLARY
Glucose-Capillary: 263 mg/dL — ABNORMAL HIGH (ref 70–99)
Glucose-Capillary: 204 mg/dL — ABNORMAL HIGH (ref 70–99)
Glucose-Capillary: 234 mg/dL — ABNORMAL HIGH (ref 70–99)
Glucose-Capillary: 313 mg/dL — ABNORMAL HIGH (ref 70–99)
Glucose-Capillary: 302 mg/dL — ABNORMAL HIGH (ref 70–99)
Glucose-Capillary: 253 mg/dL — ABNORMAL HIGH (ref 70–99)

## 2021-06-11 LAB — COMPREHENSIVE METABOLIC PANEL
ALT: 78 U/L — ABNORMAL HIGH (ref 0–44)
AST: 248 U/L — ABNORMAL HIGH (ref 15–41)
Albumin: 2.8 g/dL — ABNORMAL LOW (ref 3.5–5.0)
Alkaline Phosphatase: 97 U/L (ref 38–126)
Anion gap: 6 (ref 5–15)
BUN: 81 mg/dL — ABNORMAL HIGH (ref 6–20)
CO2: 29 mmol/L (ref 22–32)
Calcium: 9.1 mg/dL (ref 8.9–10.3)
Chloride: 111 mmol/L (ref 98–111)
Creatinine, Ser: 1.41 mg/dL — ABNORMAL HIGH (ref 0.61–1.24)
GFR, Estimated: 58 mL/min — ABNORMAL LOW (ref >60)
Glucose, Bld: 292 mg/dL — ABNORMAL HIGH (ref 70–99)
Potassium: 4.1 mmol/L (ref 3.5–5.1)
Sodium: 146 mmol/L — ABNORMAL HIGH (ref 135–145)
Total Bilirubin: 25.4 mg/dL (ref 0.3–1.2)
Total Protein: 5.3 g/dL — ABNORMAL LOW (ref 6.5–8.1)

## 2021-06-11 LAB — PROTIME-INR
INR: 2.5 — ABNORMAL HIGH (ref 0.8–1.2)
Prothrombin Time: 26.4 seconds — ABNORMAL HIGH (ref 11.4–15.2)

## 2021-06-11 LAB — TYPE AND SCREEN
ABO/RH(D): A POS
Antibody Screen: NEGATIVE
Unit division: 0
Unit division: 0
Unit division: 0

## 2021-06-11 MED ORDER — INSULIN GLARGINE-YFGN 100 UNIT/ML ~~LOC~~ SOLN
20.0000 [IU] | Freq: Every day | SUBCUTANEOUS | Status: DC
Start: 2021-06-11 — End: 2021-06-12
  Administered 2021-06-11 – 2021-06-12 (×2): 20 [IU] via SUBCUTANEOUS
  Filled 2021-06-11 (×2): qty 0.2

## 2021-06-11 MED ORDER — FREE WATER
200.0000 mL | Freq: Four times a day (QID) | Status: DC
  Administered 2021-06-11 – 2021-06-12 (×4): 200 mL

## 2021-06-11 MED ORDER — CHLORDIAZEPOXIDE HCL 10 MG PO CAPS
10.0000 mg | ORAL_CAPSULE | Freq: Three times a day (TID) | ORAL | Status: DC | PRN
  Administered 2021-06-11: 10 mg via ORAL
  Filled 2021-06-11: qty 1

## 2021-06-11 NOTE — Progress Notes (Signed)
?[facility] ?Progress Note  ? ? ?Assessment/ Plan:   ?Decompensated hepatic cirrhosis: d/t EtOH.  Got steroids, PPI, albumin, ceftriaxone, octreotide, levophed.  GI following.  D/w wife- wasn't taking his lactulose at home.  He is getting here. ?AKI: in the setting of shock with severe anemia +/- sepsis- ATN vs HRS, encouraged since he's making urine now--> had a pretty severe kidney injury, hopefully we can squeak by without dialysis as he is a poor candidate short or long term.  Got appropriate rx for HRS as above in #1.  Cr improving, UOP good.  Nothing really else to add- will sign off.  Call with questions ?Hepatic encephalopathy- getting lactulose ?EtOH abuse- thiamine, folate, per PCCM ?Acute hypoxic RF: vented  ?Hematemesis: GI following, transfuse prn, no immediate plans for scope ?Dispo: poor long-term prognosis but for now appears to be improving ? ?Subjective:   ? ?Renal issues continue to resolve, BUN down and now more awake and alert.  Bili still rising.    ? ?Objective:   ?BP (!) 131/59   Pulse 99   Temp 98.7 ?F (37.1 ?C) (Oral)   Resp (!) 28   Ht _0  (1.93 m)   Wt 85.7 kg   SpO2 99%   BMI 23.00 kg/m?  ? ?Intake/Output Summary (Last 24 hours) at 06/11/2021 1205 ?Last data filed at 06/11/2021 0755 ?Gross per 24 hour  ?Intake 747.83 ml  ?Output 2975 ml  ?Net -2227.17 ml  ? ?Weight change:  ? ?Physical Exam: ?GEN intubated, more awake ?HEENT + icteric sclera, + temporal wasting ?NECK no JVD ?PULM coarse bilaterally ?CV tachycardic ?ABD soft, + fluid wave ?EXT trace LE edema ? ?Imaging: ?DG Abd 1 View ? ?Result Date: 06/09/2021 ?CLINICAL DATA:  Orogastric tube placement. EXAM: ABDOMEN - 1 VIEW COMPARISON:  June 08, 2021. FINDINGS: Distal tip of nasogastric tube is seen in expected position of proximal stomach. IMPRESSION: Distal tip of nasogastric tube seen in expected position of proximal stomach. Electronically Signed   By: Marijo Conception M.D.   On: 06/09/2021 16:24  ? ?DG Abd  Portable 1V ? ?Result Date: 06/11/2021 ?CLINICAL DATA:  Feeding tube placement EXAM: PORTABLE ABDOMEN - 1 VIEW COMPARISON:  06/09/2021 FINDINGS: The nasogastric tube has been removed and a feeding tube placed. This terminates over the gastric antrum. No gross free intraperitoneal air. Right-side of the abdomen and pelvis are excluded. IMPRESSION: Feeding tube terminating at the gastric antrum. Electronically Signed   By: Abigail Miyamoto M.D.   On: 06/11/2021 10:29   ? ?Labs: ?BMET ?Recent Labs  ?Lab 06/08/21 ?0320 06/08/21 ?1025 06/08/21 ?1556 06/09/21 ?3893 06/10/21 ?7342 06/11/21 ?0235 06/11/21 ?0940  ?NA 139 138 135 133* 139 146* 152*  ?K 3.5 3.9 4.4 3.6 3.6 4.1 4.0  ?CL 90* 94*  --  93* 100 111  --   ?CO2 30 25  --  _1 --   ?GLUCOSE 155* 144*  --  183* 221* 292*  --   ?BUN 63* 76*  --  103* 113* 81*  --   ?CREATININE 1.55* 2.20*  --  3.14* 2.11* 1.41*  --   ?CALCIUM 10.4* 9.6  --  8.3* 8.1* 9.1  --   ?PHOS  --   --   --   --  4.2  --   --   ? ?CBC ?Recent Labs  ?Lab 06/08/21 ?0320 06/08/21 ?1556 06/08/21 ?2255 06/09/21 ?8768 06/10/21 ?0448 06/10/21 ?1154 06/11/21 ?0235 06/11/21 ?0940  ?WBC 11.2*  --  9.4 10.4 10.0  --  11.4*  --   ?NEUTROABS 8.6*  --   --   --  6.4  --  6.5  --   ?HGB 7.9*   < > 7.6* 7.2* 6.9* 7.4* 7.6* 6.8*  ?HCT 22.8*   < > 20.8* 19.3* 19.3* 20.7* 21.2* 20.0*  ?MCV 122.6*  --  103.0* 102.7* 106.0*  --  105.5*  --   ?PLT 118*  --  60* 65* 68*  --  71*  --   ? < > = values in this interval not displayed.  ? ? ?Medications:   ? ? sodium chloride   Intravenous Once  ? chlorhexidine gluconate (MEDLINE KIT)  15 mL Mouth Rinse BID  ? Chlorhexidine Gluconate Cloth  6 each Topical Daily  ? feeding supplement (PROSource TF)  45 mL Per Tube TID  ? folic acid  1 mg Per Tube Daily  ? free water  200 mL Per Tube Q6H  ? insulin aspart  0-15 Units Subcutaneous Q4H  ? insulin glargine-yfgn  20 Units Subcutaneous Daily  ? lactulose  30 g Per Tube BID  ? mouth rinse  15 mL Mouth Rinse 10 times per day  ?  methylPREDNISolone (SOLU-MEDROL) injection  40 mg Intravenous Daily  ? multivitamin with minerals  1 tablet Per Tube Daily  ? pantoprazole  40 mg Intravenous Q12H  ? rifaximin  550 mg Per Tube BID  ? thiamine  100 mg Per Tube Daily  ? ? ? ? ?Madelon Lips MD ?06/11/2021, 12:05 PM   ?

## 2021-06-11 NOTE — Procedures (Addendum)
Extubation Procedure Note ? ?Patient Details:   ?Name: Eric Dennis ?DOB: 26-Sep-1965 ?MRN: [id number] ?  ?Airway Documentation:  ?  ?Vent end date: 06/11/21 Vent end time: 1536  ? ?Evaluation ? O2 sats: stable throughout ?Complications: No apparent complications ?Patient did tolerate procedure well. ?Bilateral Breath Sounds: Rhonchi ?  ?Yes ? ?Pt extubated per MD. Positive cuff leak, no stridor noted, Bilateral rhonchi BS, instructed Pt on IS/flutter use. Pt able to talk, placed on [place] 3L, sats 100%. RT will continue to monitor.  ? ?Corwin Levins ?06/11/2021, 3:38 PM ? ?

## 2021-06-11 NOTE — Progress Notes (Signed)
? ?NAME:  Eric Dennis, MRN:  [id number], DOB:  Apr 21, 1965, LOS: 3 ?ADMISSION DATE:  06/08/2021, CONSULTATION DATE:  06/08/21 ?REFERRING MD:  Glick-TRH, CHIEF COMPLAINT:  AMS ?History of Present Illness:  ?56 year old male with past medical hx of alcohol use c/b alcoholic cirrhosis and prior esophageal varices s/p banding, presented to [facility] ED on 06/08/21 with AMS with family reporting half a week hx of worsening alteration in mental status with concomitant low PO intake. Patient hypotensive in the ED. Lab work showed patient having elevated LFTs, INR, and ammonia, along with AKI, and electrolyte abnormalities. Imaging negative for head injury or evidence of pneumonia. Patient admitted to ICU due to acute decompensated liver cirrhosis and difficulty protecting the airway requiring intubation.  ?Other Pertinent Medical History  ?Anemia ?Thrombocytopenia ?Alcohol use disorder ?Significant Hospital Events: ?Including procedures, antibiotic start and stop dates in addition to other pertinent events   ?04/02 Presented to the ED with worsening AMS and difficulty protecting the airway. Patient intubated. Transferred to ICU given acute risk for decompensation. AKI improved with IVF. ?04/03 Nephrology consulted for worsening AKI 2.20-->3.14.  ?04/04 Pt had hgb drop less to than 7 requiring 1 unit pRBC. Weaned from ventilator until 8 pm yesterday. ?Interim History / Subjective:  ?Overnight patient given fentanyl and versed due to concern for self extubation. Patient continues to be intubated on exam but nods yes/no. More alert from yesterday. Denies any acute concerns. ?Review of Systems:   ?Negative unless stated in the HPI.  ?Objective:  Improving with MAP>70 and no hypotensive episodes.    ?Blood pressure (!) 130/59, pulse (!) 103, temperature 99.2 ?F (37.3 ?C), temperature source Oral, resp. rate 16, height '6\' 4"'$  (1.93 m), weight 85.7 kg, SpO2 100 %. ?   ?Vent Mode: PRVC ?FiO2 (%):  [30 %-40 %] 30 % ?Set Rate:  [16 bmp] 16  bmp ?Vt Set:  [570 mL-690 mL] 570 mL ?PEEP:  [5 cmH20] 5 cmH20 ?Pressure Support:  [8 cmH20] 8 cmH20 ?Plateau Pressure:  [14 cmH20-23 cmH20] 14 cmH20  ? ?Intake/Output Summary (Last 24 hours) at 06/11/2021 1043 ?Last data filed at 06/11/2021 0755 ?Gross per 24 hour  ?Intake 927.45 ml  ?Output 3275 ml  ?Net -2347.55 ml  ?Urine output 3.25 L ?Stool Ouput 675 mL ? ?Filed Weights  ? 06/09/21 0424 06/10/21 0447  ?Weight: 87.5 kg 85.7 kg  ?Examination: ?General: laying comfortably, NAD, opens eye on command ?HENT: icteric sclera. PERRL, intubated, OG tube placed.  ?Lungs: CTAB ?Cardiovascular: NSR, tachycardic  ?Abdomen: distended, normal bowel sounds ?Extremities: no asymmetry, normal bulk and tone. Able to move extremities on command. Weak grip and strength.  ?Neuro: follows commands consistently, but does dose off intermittently.  ?Skin: yellowing of skin noticed more on upper than lower.  ?Consults  ?Nephrology following; appreciate their assistance in taking care of this patient.  ?GI following; appreciate their assistance in taking care of this patient.  ?Resolved Problems  ?Hyponatremia ?Hypovolemic Shock ?Assessment & Plan:  ?Acute Hepatic Encephalopathy ?Decompensated Alcoholic cirrhosis ?Acute Alcoholic hepatitis ?Metabolic Alkalosis ?Coagulopathy ?Thrombocytopenia ?Hyperbilirubinemia ?Hyperammonemia ?MELD-Na score 33 with 65 % estimated 90 day mortality. MDF 91.6 suggesting poor prognosis. Has recent alcohol use. S/p 1 FFP for coagulopathy. Albumin stable around 3. Thrombocytopenia stable. LFTs and bilirubin up-trending most likely 2/2 to his acute presentation. Will continue to give solumedrol time and reassess on Day 7. AMS is improving, patient still requiring intubation but has weaned >8hrs yesterday. Will titrate his settings and continue to wean as  patient is becoming more alert and given his metabolic alkalosis. Will dc Fentanyl and Versed due to AMS and use Precedex if needed.  ?-Continue Lactulose '30mg'$  PO  BID, and rifaximin 550 mg BID for hyperammonemia. IV rocephin 1g qd for SBP ppx (Day 4). Continue Solumedrol 40 mg qd (Day 4) for alcoholic hepatitis. Appears to be contributing to elevated BUN and mild leukocytosis.  ?-Cor-trak placed; receiving enteral feeding.  ?-CTM CMP, INR ?Acute Kidney Injury 2/2 to ischemic ATN ?Hypernatremia ?Improving. AKI improved with improved perfusion. Cr 2.11-->1.41. BUN improving.  Slightly hypernatremic most likely due to post-ATN diuresis and decreased intake creating free water deficit. Calculated at 2.2 L.  ?-Nephrology following. HRS less likely given improvement with fluids but patient still at high risk due to severe hepatic disease. Albumin discontinued as patient does not have HRS.  ?-200 cc free water q6hrs per tube.  ?-CTM renal fxn ?-Renally dose med and avoid nephrotoxic meds ?Protein calorie malnutrition due to alcoholism ?Failure to thrive in adults ?2/2 alcohol use disorder and hepatic cirrhosis. Cor-trak placed and patient receiving tube feeds at 60 cc/hr abd feeding supplement 45 ml TID.  ?-Continue enteral nutrition at 60cc/hr and the feeding supplement 45 ml TID.  ?Anemia of critical illness ?Hx of anemia  ?Hx of esophageal bleed ?Stable. S/p 1 unit of pRBC yesterday. Given hx of esophageal bleed and reported hematemesis, GI consulted and following. No endoscopy recommended given recent endoscopy showing no large varices. GI will consider endoscopy if pt rebleeds and INR is </=1.7.  ?-Continue empiric abx. Continue Protonix to 40 mg BID.  ?-Transfuse with hgb goal of >7.  ?-CTM. Call [facility] if pt re-bleeds. ?Ventricle trigeminy ?Prolonged qtc ?Electrolyte abnormalities ?K wnl on 04/05. Replete as needed for K goal of 4 and Mag goal of 2. ?-Will replete K as needed ?-CTM ?Hyperglycemia ?2/2 to acute illness, enteral feeding and solumedrol use. Was on 10 units of semglee. CBG have been >180.  ?-Increase semglee to 20 units and continue SSI.  ?Best Practice (right  click and "Reselect all SmartList Selections" daily)  ?Diet/type: Enteral feeds ?DVT prophylaxis: SCD ?GI prophylaxis: PPI ?Lines: Central line ?Continuous IV: None ?Foley:  N/A ?Code Status:  full code ?Last update of family at bedside [04/04] ?Labs   ?CBC: ?Recent Labs  ?Lab 06/08/21 ?0320 06/08/21 ?1556 06/08/21 ?2255 06/09/21 ?3235 06/10/21 ?0448 06/10/21 ?1154 06/11/21 ?0235 06/11/21 ?0940  ?WBC 11.2*  --  9.4 10.4 10.0  --  11.4*  --   ?NEUTROABS 8.6*  --   --   --  6.4  --  6.5  --   ?HGB 7.9*   < > 7.6* 7.2* 6.9* 7.4* 7.6* 6.8*  ?HCT 22.8*   < > 20.8* 19.3* 19.3* 20.7* 21.2* 20.0*  ?MCV 122.6*  --  103.0* 102.7* 106.0*  --  105.5*  --   ?PLT 118*  --  60* 65* 68*  --  71*  --   ? < > = values in this interval not displayed.  ?Basic Metabolic Panel: ?Recent Labs  ?Lab 06/08/21 ?0320 06/08/21 ?1025 06/08/21 ?1556 06/09/21 ?5732 06/10/21 ?2025 06/11/21 ?0235 06/11/21 ?0940  ?NA 139 138 135 133* 139 146* 152*  ?K 3.5 3.9 4.4 3.6 3.6 4.1 4.0  ?CL 90* 94*  --  93* 100 111  --   ?CO2 30 25  --  '25 26 29  '$ --   ?GLUCOSE 155* 144*  --  183* 221* 292*  --   ?BUN 63* 76*  --  103* 113* 81*  --   ?CREATININE 1.55* 2.20*  --  3.14* 2.11* 1.41*  --   ?CALCIUM 10.4* 9.6  --  8.3* 8.1* 9.1  --   ?MG 1.4*  --   --  1.8 2.1  --   --   ?PHOS  --   --   --   --  4.2  --   --   ?GFR: ?Estimated Creatinine Clearance: 70.1 mL/min (A) (by C-G formula based on SCr of 1.41 mg/dL (H)). ?Recent Labs  ?Lab 06/08/21 ?2255 06/09/21 ?8338 06/10/21 ?2505 06/11/21 ?0235  ?WBC 9.4 10.4 10.0 11.4*  ?Liver Function Tests: ?Recent Labs  ?Lab 06/08/21 ?0320 06/08/21 ?1025 06/09/21 ?3976 06/10/21 ?7341 06/11/21 ?0235  ?AST 102* 83* 195* 217* 248*  ?ALT 48* 40 57* 69* 78*  ?ALKPHOS 86 72 54 52 97  ?BILITOT 19.0* 16.6* 20.9* 22.6* 25.4*  ?PROT 5.6* 4.9* 5.3* 5.4* 5.3*  ?ALBUMIN 2.3* 1.9* 2.9* 3.0* 2.8*  ? ?Recent Labs  ?Lab 06/08/21 ?0320  ?LIPASE 50  ? ?Recent Labs  ?Lab 06/08/21 ?0320  ?AMMONIA 112*  ?ABG ?   ?Component Value Date/Time  ? PHART  7.565 (H) 06/11/2021 0940  ? PCO2ART 35.3 06/11/2021 0940  ? PO2ART 123 (H) 06/11/2021 0940  ? HCO3 31.9 (H) 06/11/2021 0940  ? TCO2 33 (H) 06/11/2021 0940  ? O2SAT 99 06/11/2021 0940  ?Coagulation Profi

## 2021-06-11 NOTE — Progress Notes (Signed)
RT note. ?RT attempt x2 this AM to flip patient on SBT, Patient continued to go apneic, RT will continue to monitor.  ?

## 2021-06-11 NOTE — Progress Notes (Signed)
Inpatient Diabetes Program Recommendations ? ?AACE/ADA: New Consensus Statement on Inpatient Glycemic Control (2015) ? ?Target Ranges:  Prepandial:   less than 140 mg/dL ?     Peak postprandial:   less than 180 mg/dL (1-2 hours) ?     Critically ill patients:  140 - 180 mg/dL  ? ?Lab Results  ?Component Value Date  ? GLUCAP 204 (H) 06/11/2021  ? HGBA1C 4.6 (L) 06/09/2021  ? ? ?Review of Glycemic Control ? Latest Reference Range & Units 06/10/21 07:05 06/10/21 11:01 06/10/21 15:02 06/10/21 19:12 06/10/21 23:34 06/11/21 03:06 06/11/21 07:15  ?Glucose-Capillary 70 - 99 mg/dL 237 (H) 263 (H) 317 (H) 227 (H) 296 (H) 263 (H) 204 (H)  ?(H): Data is abnormally high ? ?Inpatient Diabetes Program Recommendations:   ?CBGs >180. ?Please consider while on steroids and tube feeds: ?-Novolog 2 units q 4 hrs. (Hold if tube feed held or stopped for any reason) ? ?Thank you, ?Nani Gasser Talasia Saulter, RN, MSN, CDE  ?Diabetes Coordinator ?Inpatient Glycemic Control Team ?Team Pager [phone] (8am-5pm) ?06/11/2021 10:01 AM ? ? ? ? ?

## 2021-06-11 NOTE — Progress Notes (Signed)
RT note. ?Patient flipped to SBT 8/5 40%. Sat 99% with no labored breathing noted. RT will continue to monitor.  ?

## 2021-06-11 NOTE — Procedures (Signed)
Cortrak ? ?Person Inserting Tube:  Glennon Kopko, Creola Corn, RD ?Tube Type:  Cortrak - 43 inches ?Tube Size:  10 ?Tube Location:  Left nare ?Secured by: Dorann Lodge ?Technique Used to Measure Tube Placement:  Marking at nare/corner of mouth ?Cortrak Secured At:  75 cm ? ?Cortrak Tube Team Note: ? ?Consult received to place a Cortrak feeding tube.  ? ?X-ray is required, abdominal x-ray has been ordered by the Cortrak team. Please confirm tube placement before using the Cortrak tube.  ? ?If the tube becomes dislodged please keep the tube and contact the Cortrak team at [email or web address] (password [email or web address]) for replacement.  ?If after hours and replacement cannot be delayed, place a NG tube and confirm placement with an abdominal x-ray.  ? ? ? ?Theone Stanley., MS, RD, LDN (she/her/hers) ?RD pager number and weekend/on-call pager number located in [place]. ? ? ?

## 2021-06-12 LAB — CBC WITH DIFFERENTIAL/PLATELET
Abs Immature Granulocytes: 0.19 10*3/uL — ABNORMAL HIGH (ref 0.00–0.07)
Basophils Absolute: 0 10*3/uL (ref 0.0–0.1)
Basophils Relative: 0 %
Eosinophils Absolute: 0 10*3/uL (ref 0.0–0.5)
Eosinophils Relative: 0 %
HCT: 24.7 % — ABNORMAL LOW (ref 39.0–52.0)
Hemoglobin: 8.4 g/dL — ABNORMAL LOW (ref 13.0–17.0)
Immature Granulocytes: 1 %
Lymphocytes Relative: 12 %
Lymphs Abs: 1.5 10*3/uL (ref 0.7–4.0)
MCH: 37.3 pg — ABNORMAL HIGH (ref 26.0–34.0)
MCHC: 34 g/dL (ref 30.0–36.0)
MCV: 109.8 fL — ABNORMAL HIGH (ref 80.0–100.0)
Monocytes Absolute: 3.6 10*3/uL — ABNORMAL HIGH (ref 0.1–1.0)
Monocytes Relative: 27 %
Neutro Abs: 7.9 10*3/uL — ABNORMAL HIGH (ref 1.7–7.7)
Neutrophils Relative %: 60 %
Platelets: 57 10*3/uL — ABNORMAL LOW (ref 150–400)
RBC: 2.25 MIL/uL — ABNORMAL LOW (ref 4.22–5.81)
WBC: 13.3 10*3/uL — ABNORMAL HIGH (ref 4.0–10.5)
nRBC: 1 % — ABNORMAL HIGH (ref 0.0–0.2)

## 2021-06-12 LAB — COMPREHENSIVE METABOLIC PANEL
ALT: 105 U/L — ABNORMAL HIGH (ref 0–44)
AST: 281 U/L — ABNORMAL HIGH (ref 15–41)
Albumin: 2.8 g/dL — ABNORMAL LOW (ref 3.5–5.0)
Alkaline Phosphatase: 237 U/L — ABNORMAL HIGH (ref 38–126)
Anion gap: 4 — ABNORMAL LOW (ref 5–15)
BUN: 63 mg/dL — ABNORMAL HIGH (ref 6–20)
CO2: 31 mmol/L (ref 22–32)
Calcium: 9.7 mg/dL (ref 8.9–10.3)
Chloride: 117 mmol/L — ABNORMAL HIGH (ref 98–111)
Creatinine, Ser: 1.25 mg/dL — ABNORMAL HIGH (ref 0.61–1.24)
GFR, Estimated: >60 mL/min (ref >60)
Glucose, Bld: 197 mg/dL — ABNORMAL HIGH (ref 70–99)
Potassium: 4.7 mmol/L (ref 3.5–5.1)
Sodium: 152 mmol/L — ABNORMAL HIGH (ref 135–145)
Total Bilirubin: 26.3 mg/dL (ref 0.3–1.2)
Total Protein: 5.6 g/dL — ABNORMAL LOW (ref 6.5–8.1)

## 2021-06-12 LAB — GLUCOSE, CAPILLARY
Glucose-Capillary: 182 mg/dL — ABNORMAL HIGH (ref 70–99)
Glucose-Capillary: 170 mg/dL — ABNORMAL HIGH (ref 70–99)
Glucose-Capillary: 203 mg/dL — ABNORMAL HIGH (ref 70–99)
Glucose-Capillary: 232 mg/dL — ABNORMAL HIGH (ref 70–99)
Glucose-Capillary: 260 mg/dL — ABNORMAL HIGH (ref 70–99)
Glucose-Capillary: 263 mg/dL — ABNORMAL HIGH (ref 70–99)

## 2021-06-12 MED ORDER — FREE WATER
300.0000 mL | Status: DC
  Administered 2021-06-12 – 2021-06-13 (×5): 300 mL

## 2021-06-12 MED ORDER — INSULIN GLARGINE-YFGN 100 UNIT/ML ~~LOC~~ SOLN
25.0000 [IU] | Freq: Every day | SUBCUTANEOUS | Status: DC
Start: 2021-06-13 — End: 2021-06-14
  Administered 2021-06-13: 25 [IU] via SUBCUTANEOUS
  Filled 2021-06-12 (×2): qty 0.25

## 2021-06-12 MED ORDER — PANTOPRAZOLE 2 MG/ML SUSPENSION
40.0000 mg | Freq: Every day | ORAL | Status: DC
  Administered 2021-06-12 – 2021-06-13 (×2): 40 mg
  Filled 2021-06-12 (×2): qty 20

## 2021-06-12 MED ORDER — ENSURE ENLIVE PO LIQD
237.0000 mL | Freq: Three times a day (TID) | ORAL | Status: DC
  Administered 2021-06-12 – 2021-06-13 (×5): 237 mL via ORAL

## 2021-06-12 MED ORDER — CHLORDIAZEPOXIDE HCL 5 MG PO CAPS: 5.0000 mg | ORAL_CAPSULE | Freq: Three times a day (TID) | ORAL | Status: DC | PRN

## 2021-06-12 MED ORDER — LACTULOSE 10 GM/15ML PO SOLN
20.0000 g | Freq: Two times a day (BID) | ORAL | Status: DC
  Administered 2021-06-12 – 2021-06-13 (×3): 20 g
  Filled 2021-06-12 (×3): qty 30

## 2021-06-12 MED ORDER — OSMOLITE 1.5 CAL PO LIQD
1000.0000 mL | ORAL | Status: DC
  Administered 2021-06-12: 1000 mL
  Filled 2021-06-12 (×2): qty 1000

## 2021-06-12 NOTE — Evaluation (Addendum)
Clinical/Bedside Swallow Evaluation ?Patient Details  ?Name: Eric Dennis ?MRN: [id number] ?Date of Birth: [birth year] ? ?Today's Date: 06/12/2021 ?Time: SLP Start Time (ACUTE ONLY): [id number] SLP Stop Time (ACUTE ONLY): 5361 ?SLP Time Calculation (min) (ACUTE ONLY): 17 min ? ?Past Medical History:  ?Past Medical History:  ?Diagnosis Date  ? Elevated liver function tests   ? High cholesterol   ? Weight loss   ? ?Past Surgical History:  ?Past Surgical History:  ?Procedure Laterality Date  ? COLONOSCOPY N/A 03/14/2014  ? Procedure: COLONOSCOPY;  Surgeon: Rogene Houston, MD;  Location: [facility];  Service: Endoscopy;  Laterality: N/A;  730 - moved to 1/6 @ 12:00 - Ann notified pt  ? COLONOSCOPY WITH PROPOFOL N/A 11/04/2020  ? Procedure: COLONOSCOPY WITH PROPOFOL;  Surgeon: Ronnette Juniper, MD;  Location: [facility];  Service: Gastroenterology;  Laterality: N/A;  ? ESOPHAGEAL BANDING  11/04/2020  ? Procedure: ESOPHAGEAL BANDING;  Surgeon: Ronnette Juniper, MD;  Location: [facility];  Service: Gastroenterology;;  ? ESOPHAGOGASTRODUODENOSCOPY N/A 05/13/2021  ? Procedure: ESOPHAGOGASTRODUODENOSCOPY (EGD);  Surgeon: Ronnette Juniper, MD;  Location: [facility];  Service: Gastroenterology;  Laterality: N/A;  ? ESOPHAGOGASTRODUODENOSCOPY (EGD) WITH PROPOFOL N/A 11/04/2020  ? Procedure: ESOPHAGOGASTRODUODENOSCOPY (EGD) WITH PROPOFOL;  Surgeon: Ronnette Juniper, MD;  Location: [facility];  Service: Gastroenterology;  Laterality: N/A;  ? IR PARACENTESIS  07/19/2020  ? NO PAST SURGERIES    ? POLYPECTOMY  11/04/2020  ? Procedure: POLYPECTOMY;  Surgeon: Ronnette Juniper, MD;  Location: [facility];  Service: Gastroenterology;;  ? ?HPI:  ?Pt is a 56 y/o male who presented to the ED 4/2 with AMS and family reports of half a week hx of worsening AMS with concomitant low PO intake. Pt intubated due to difficulty with airway protection and incisor noted adjacent to the ETT and vocal folds during larynx visualization' tooth removed. ETT 4/2-4/5. Pt found to have  AKI and acute hepatic encephalopathy. GI consulted due to dark stools in rectal tube' plan to defer EGD unless signso f active bleeding develop. PMH EtOH abuse, alcoholic cirrhosis,  prior esophageal varices s/p banding. MBS 08/28/20: mild oropharyngeal and cervical esophageal dysphagia. Minimal decreased coordination of swallow and swallow triggering at pyriform sinus allowing minimal laryngeal penetration of thin x2 that did not clear larynx. A dysphagia 2 diet with thin liquids recommended and pt was then advanced to regular texture solids and thin liquids on 08/30/20.  ?  ?Assessment / Plan / Recommendation  ?Clinical Impression ? Pt was seen for bedside swallow evaluation with his wife present. Both parties denied the pt having a history of dysphagia prior to admission. Oral mechanism exam was [facility] for strength and ROM. Dentition was limited and in poor condition. An intermittently hoarse vocal quality was noted, suggesting vocal fold insufficiency and increasing aspiration risk. Pt demonstrated symptoms of post-extubation dysphagia characterized by coughing with consecutive swallows of thin liquids and with individual swallows of thin liquids via straw. Pt consistently tolerated individual boluses of thin liquids via cup. A dysphagia 3 diet with thin liquids is recommended at this time. Results and recommendations discussed with pt, wife, RN. SLP will follow to assess improvement in swallow function and for diet advancement as clinically indicated. ?SLP Visit Diagnosis: Dysphagia, unspecified (R13.10) ?   ?Aspiration Risk ? Mild aspiration risk  ?  ?Diet Recommendation Dysphagia 3 (Mech soft);Thin liquid  ? ?Liquid Administration via: Cup;No straw ?Medication Administration: Whole meds with puree (or via cortrak) ?Supervision: Staff to assist with self feeding;Full supervision/cueing for compensatory  strategies ?Compensations: Slow rate;Small sips/bites (avoid consecutive swallows) ?Postural Changes: Seated  upright at 90 degrees  ?  ?Other  Recommendations Oral Care Recommendations: Oral care BID   ? ?Recommendations for follow up therapy are one component of a multi-disciplinary discharge planning process, led by the attending physician.  Recommendations may be updated based on patient status, additional functional criteria and insurance authorization. ? ?Follow up Recommendations No SLP follow up  ? ? ?  ?Assistance Recommended at Discharge    ?Functional Status Assessment Patient has had a recent decline in their functional status and demonstrates the ability to make significant improvements in function in a reasonable and predictable amount of time.  ?Frequency and Duration min 2x/week  ?1 week ?  ?   ? ?Prognosis Prognosis for Safe Diet Advancement: Good  ? ?  ? ?Swallow Study   ?General Date of Onset: 06/11/21 ?HPI: Pt is a 56 y/o male who presented to the ED 4/2 with AMS and family reports of half a week hx of worsening AMS with concomitant low PO intake. Pt intubated due to difficulty with airway protection and incisor noted adjacent to the ETT and vocal folds during larynx visualization' tooth removed. ETT 4/2-4/5. Pt found to have AKI and acute hepatic encephalopathy. GI consulted due to dark stools in rectal tube' plan to defer EGD unless signso f active bleeding develop. PMH EtOH abuse, alcoholic cirrhosis,  prior esophageal varices s/p banding. MBS 08/28/20: mild oropharyngeal and cervical esophageal dysphagia. Minimal decreased coordination of swallow and swallow triggering at pyriform sinus allowing minimal laryngeal penetration of thin x2 that did not clear larynx. A dysphagia 2 diet with thin liquids recommended and pt was then advanced to regular texture solids and thin liquids on 08/30/20. ?Type of Study: Bedside Swallow Evaluation ?Previous Swallow Assessment: See HPI ?Diet Prior to this Study: NPO;NG Tube ?Temperature Spikes Noted: No ?Respiratory Status: Nasal cannula ?History of Recent Intubation:  Yes ?Length of Intubations (days): 3 days ?Date extubated: 06/11/21 ?Behavior/Cognition: Alert;Cooperative;Pleasant mood;Requires cueing (slower processing speed) ?Oral Cavity Assessment: Within Functional Limits ?Oral Care Completed by SLP: No ?Oral Cavity - Dentition: Poor condition;Missing dentition ?Vision: Functional for self-feeding ?Self-Feeding Abilities: Needs assist ?Patient Positioning: Upright in bed;Postural control adequate for testing ?Baseline Vocal Quality: Hoarse (intermittently) ?Volitional Cough: Strong ?Volitional Swallow: Able to elicit  ?  ?Oral/Motor/Sensory Function Overall Oral Motor/Sensory Function: Within functional limits   ?Ice Chips Ice chips: Within functional limits ?Presentation: Spoon   ?Thin Liquid Thin Liquid: Impaired ?Presentation: Straw;Cup ?Pharyngeal  Phase Impairments: Cough - Delayed (with consecutive swallows)  ?  ?Nectar Thick Nectar Thick Liquid: Not tested   ?Honey Thick Honey Thick Liquid: Not tested   ?Puree Puree: Within functional limits ?Presentation: Spoon   ?Solid ? ? ?  Solid: Within functional limits ?Presentation: Self Fed  ? ?  ?Opie Fanton I. Hardin Negus, [place], CCC-SLP ?Acute Rehabilitation Services ?Office number [phone] ?Pager [phone] ? ?Horton Marshall ?06/12/2021,9:15 AM ? ? ? ? ? ? ?

## 2021-06-12 NOTE — Progress Notes (Signed)
Nutrition Follow-up ? ?DOCUMENTATION CODES:  ? ?Non-severe (moderate) malnutrition in context of acute illness/injury ? ?INTERVENTION:  ? ?Ensure Enlive po TID, each supplement provides 350 kcal and 20 grams of protein. ? ?Keep Cortrak in place for now, change to nocturnal TF: ?Osmolite 1.5 at 60 ml/h from 6pm to 6am  ?Provides 1080 kcal, 45 gm protein, 549 ml free water daily. ? ?MVI with minerals PO once daily. ? ?NUTRITION DIAGNOSIS:  ? ?Moderate Malnutrition related to acute illness (decompensated cirrhosis) as evidenced by mild fat depletion, mild muscle depletion, moderate muscle depletion. ? ?Ongoing  ? ?GOAL:  ? ?Patient will meet greater than or equal to 90% of their needs ? ?Progressing  ? ?MONITOR:  ? ?PO intake, Supplement acceptance, Labs, TF tolerance, Skin ? ?REASON FOR ASSESSMENT:  ? ?Ventilator, Consult ?Enteral/tube feeding initiation and management ? ?ASSESSMENT:  ? ?56 yo male admitted with AMS, GI bleed, decompensated cirrhosis. PMH includes alcohol use, HLD. ? ?Discussed patient in ICU rounds and with RN today. ?Patient was extubated 4/5  ?S/P bedside swallow evaluation with SLP this morning. ?Diet advanced to dysphagia 3 with thin liquids. ? ?Cortrak placed 4/5. Tip is gastric. ?Currently receiving Vital 1.5 at 60 ml/h with Prosource TF 45 ml TID. ?Will change to nocturnal feedings. ? ?Patient reports recent weight loss. He usually weighs 190 lbs. Current weight 78.9 kg (173.9 lbs). 9% weight loss related to fluids, but could also represent loss of LBM.   ? ?Labs reviewed. Na 152 ?CBG: [phone] ? ?Medications reviewed and include folic acid, Novolog, Semglee, lactulose, Solu-medrol, MVI with minerals, thiamine. ? ? ?NUTRITION - FOCUSED PHYSICAL EXAM: ? ?Flowsheet Row Most Recent Value  ?Orbital Region Mild depletion  ?Upper Arm Region Mild depletion  ?Thoracic and Lumbar Region Mild depletion  ?Buccal Region Mild depletion  ?Temple Region Moderate depletion  ?Clavicle Bone Region Moderate  depletion  ?Clavicle and Acromion Bone Region Moderate depletion  ?Scapular Bone Region Moderate depletion  ?Dorsal Hand No depletion  ?Patellar Region Mild depletion  ?Anterior Thigh Region Mild depletion  ?Posterior Calf Region Mild depletion  ?Edema (RD Assessment) None  ?Hair Reviewed  ?Eyes Reviewed  ?Mouth Reviewed  ?Skin Reviewed  ?Nails Reviewed  ? ?  ? ? ?Diet Order:   ?Diet Order   ? ?       ?  DIET DYS 3 Room service appropriate? Yes; Fluid consistency: Thin  Diet effective now       ?  ? ?  ?  ? ?  ? ? ?EDUCATION NEEDS:  ? ?Not appropriate for education at this time ? ?Skin:  Skin Assessment: Skin Integrity Issues: ?Skin Integrity Issues:: DTI ?DTI: L elbow ? ?Last BM:  4/5 type 7 ? ?Height:  ? ?Ht Readings from Last 1 Encounters:  ?06/09/21 '6\' 4"'$  (1.93 m)  ? ? ?Weight:  ? ?Wt Readings from Last 1 Encounters:  ?06/12/21 78.9 kg  ? ? ?BMI:  Body mass index is 21.17 kg/m?. ? ?Estimated Nutritional Needs:  ? ?Kcal:  2200-2400 ? ?Protein:  100-120 gm ? ?Fluid:  2.2-2.4 L ? ? ? ?Lucas Mallow RD, LDN, CNSC ?Please refer to Amion for contact information.                                                       ? ?

## 2021-06-12 NOTE — Progress Notes (Addendum)
? ?NAME:  Eric Dennis, MRN:  [id number], DOB:  10-15-1965, LOS: 4 ?ADMISSION DATE:  06/08/2021, CONSULTATION DATE:  06/08/21 ?REFERRING MD:  Glick-TRH, CHIEF COMPLAINT:  AMS ?History of Present Illness:  ?56 year old male with past medical hx of alcohol use c/b alcoholic cirrhosis and prior esophageal varices s/p banding, presented to [facility] ED on 06/08/21 with AMS with family reporting half a week hx of worsening alteration in mental status with concomitant low PO intake. Patient hypotensive in the ED. Lab work showed patient having elevated LFTs, INR, and ammonia, along with AKI, and electrolyte abnormalities. Imaging negative for head injury or evidence of pneumonia. Patient admitted to ICU due to acute decompensated liver cirrhosis and difficulty protecting the airway requiring intubation.  ?Other Pertinent Medical History  ?Anemia ?Thrombocytopenia ?Alcohol use disorder ?Significant Hospital Events: ?Including procedures, antibiotic start and stop dates in addition to other pertinent events   ?04/02 Presented to the ED with worsening AMS and difficulty protecting the airway. Patient intubated. Transferred to ICU given acute risk for decompensation. AKI improved with IVF. ?04/03 Nephrology consulted for worsening AKI 2.20-->3.14.  ?04/04 Pt had hgb drop less to than 7 requiring 1 unit pRBC. Weaned from ventilator until 8 pm. ?04/05 Patient weaned again and tolerated it well, he was extubated later. ?04/06 Patient remained stable off intubation, stable for discharge.  ?Interim History / Subjective:  ?NAEON. CIWA 3 for agitation and anxiety. Denies any acute concerns.  ?Review of Systems:   ?Negative unless stated in the HPI.  ?Objective:  Improving with MAP>70 and no hypotensive episodes.    ?Blood pressure 137/71, pulse 96, temperature 98 ?F (36.7 ?C), resp. rate 18, height '6\' 4"'$  (1.93 m), weight 78.9 kg, SpO2 100 %. ?   ?Vent Mode: PSV;CPAP ?FiO2 (%):  [30 %] 30 % ?Set Rate:  [16 bmp] 16 bmp ?Vt Set:  [520 mL-570  mL] 520 mL ?PEEP:  [5 cmH20] 5 cmH20 ?Pressure Support:  [8 cmH20] 8 cmH20 ?Plateau Pressure:  [13 cmH20] 13 cmH20  ? ?Intake/Output Summary (Last 24 hours) at 06/12/2021 0737 ?Last data filed at 06/12/2021 0459 ?Gross per 24 hour  ?Intake 2687.83 ml  ?Output 3076 ml  ?Net -388.17 ml  ?Urine output 2 L ?Stool Ouput 1.07 L ? ?Filed Weights  ? 06/09/21 0424 06/10/21 0447 06/12/21 0429  ?Weight: 87.5 kg 85.7 kg 78.9 kg  ?Examination: ?General: NAD ?HENT:NCAT, sclera icteric, NG tube placed, slightly dry MMM ?Lungs: diffuse rhonci present, but no rales, or wheeze ?Cardiovascular: NSR, tachycardic ?Abdomen: Distended, normal bowel sounds, No TTP ?Extremities:No asymmetry  ?Neuro: alert and oriented x 2-3 ?Skin: skin juandice upper>lower ?Consults  ?Nephrology signed off; appreciate their assistance in taking care of this patient.  ?GI signed off; appreciate their assistance in taking care of this patient.  ?Resolved Problems  ?Hyponatremia ?Hypovolemic Shock ?Assessment & Plan:  ?Acute Hepatic Encephalopathy ?Decompensated Alcoholic cirrhosis ?Acute Alcoholic hepatitis ?Metabolic Alkalosis ?Coagulopathy ?Thrombocytopenia ?Hyperbilirubinemia ?Hyperammonemia ?MELD-Na score 33 with 65 % estimated 90 day mortality. MDF 91.6 suggesting poor prognosis. Has recent alcohol use. S/p 1 FFP for coagulopathy. Albumin stable around 3. Thrombocytopenia stable. LFTs and bilirubin up-trending most likely 2/2 to his acute presentation. Will continue to give solumedrol time and reassess on Day 7. Will perform Lille score after day 7, to determine if it is appropriate for continuation. AMS is improving, patient extubated yesterday and continues to do well. Plan to transfer to progressive today.  ?-Decrease Lactulose 30 mg to 20 mg PO BID, and  rifaximin 550 mg BID for hyperammonemia. IV rocephin 1g qd for SBP ppx (Day 5). Continue Solumedrol 40 mg qd (Day 5) for alcoholic hepatitis. Appears to be contributing to elevated BUN and mild  leukocytosis.  ?-Cor-trak placed; receiving enteral feeding. SLP consult placed.  ?-CTM CMP, INR ?Acute Kidney Injury 2/2 to ischemic ATN ?Hypernatremia ?Improving. AKI improved with improved perfusion. Cr 1.41-->1.25. BUN improving.  Hypernatremia worsened to 152 most likely due to continued UOP of 2 L and 1 L watery stool output.  Calculated at 4.1 L.  ?-Nephrology signed off due to improved renal failure ?-300 cc free water q4hrs per tube. Will consider increase.  ?-CTM renal fxn ?-Renally dose med and avoid nephrotoxic meds ?Protein calorie malnutrition due to alcoholism ?Failure to thrive in adults ?2/2 alcohol use disorder and hepatic cirrhosis. Cor-trak placed and patient receiving tube feeds at 60 cc/hr abd feeding supplement 45 ml TID.  ?-Continue enteral nutrition at 60cc/hr and the feeding supplement 45 ml TID.  ?-SLP consult placed.  ?Anemia of critical illness ?Hx of anemia  ?Hx of esophageal bleed ?Stable. S/p 1 unit of pRBC yesterday. Given hx of esophageal bleed and reported hematemesis, GI consulted. No endoscopy recommended given recent endoscopy showing no large varices. GI will consider endoscopy if pt rebleeds and INR is </=1.7. GI signed off due to stability of hgb.  ?-Continue empiric abx. Switch Protonix to 40 mg qd.  ?-Transfuse with hgb goal of >7.  ?-CTM. Call [facility] if pt re-bleeds. ?Ventricle trigeminy ?Prolonged qtc ?Electrolyte abnormalities ?K wnl on 04/05. Replete as needed for K goal of 4 and Mag goal of 2. ?-Will replete K as needed ?-CTM ?Hyperglycemia ?2/2 to acute illness, enteral feeding and solumedrol use. Increase to 20 unit semglee yesterday. CBG have been >180.  ?-Increase semglee to 25 units and continue SSI.  ?Best Practice (right click and "Reselect all SmartList Selections" daily)  ?Diet/type: Enteral feeds ?DVT prophylaxis: SCD ?GI prophylaxis: PPI ?Lines: Central line ?Continuous IV: None ?Foley:  N/A ?Code Status:  full code ?Last update of family at bedside  [04/06] ?Labs   ?CBC: ?Recent Labs  ?Lab 06/08/21 ?0320 06/08/21 ?1556 06/08/21 ?2255 06/09/21 ?3419 06/10/21 ?0448 06/10/21 ?1154 06/11/21 ?0235 06/11/21 ?3790 06/12/21 ?2409  ?WBC 11.2*  --  9.4 10.4 10.0  --  11.4*  --  13.3*  ?NEUTROABS 8.6*  --   --   --  6.4  --  6.5  --  7.9*  ?HGB 7.9*   < > 7.6* 7.2* 6.9* 7.4* 7.6* 6.8* 8.4*  ?HCT 22.8*   < > 20.8* 19.3* 19.3* 20.7* 21.2* 20.0* 24.7*  ?MCV 122.6*  --  103.0* 102.7* 106.0*  --  105.5*  --  109.8*  ?PLT 118*  --  60* 65* 68*  --  71*  --  57*  ? < > = values in this interval not displayed.  ?Basic Metabolic Panel: ?Recent Labs  ?Lab 06/08/21 ?0320 06/08/21 ?1025 06/08/21 ?1556 06/09/21 ?7353 06/10/21 ?2992 06/11/21 ?0235 06/11/21 ?4268 06/12/21 ?3419  ?NA 139 138   < > 133* 139 146* 152* 152*  ?K 3.5 3.9   < > 3.6 3.6 4.1 4.0 4.7  ?CL 90* 94*  --  93* 100 111  --  117*  ?CO2 30 25  --  '25 26 29  '$ --  31  ?GLUCOSE 155* 144*  --  183* 221* 292*  --  197*  ?BUN 63* 76*  --  103* 113* 81*  --  63*  ?CREATININE 1.55* 2.20*  --  3.14* 2.11* 1.41*  --  1.25*  ?CALCIUM 10.4* 9.6  --  8.3* 8.1* 9.1  --  9.7  ?MG 1.4*  --   --  1.8 2.1  --   --   --   ?PHOS  --   --   --   --  4.2  --   --   --   ? < > = values in this interval not displayed.  ?GFR: ?Estimated Creatinine Clearance: 72.8 mL/min (A) (by C-G formula based on SCr of 1.25 mg/dL (H)). ?Recent Labs  ?Lab 06/09/21 ?2025 06/10/21 ?4270 06/11/21 ?0235 06/12/21 ?6237  ?WBC 10.4 10.0 11.4* 13.3*  ?Liver Function Tests: ?Recent Labs  ?Lab 06/08/21 ?1025 06/09/21 ?6283 06/10/21 ?1517 06/11/21 ?0235 06/12/21 ?6160  ?AST 83* 195* 217* 248* 281*  ?ALT 40 57* 69* 78* 105*  ?ALKPHOS 72 54 52 97 237*  ?BILITOT 16.6* 20.9* 22.6* 25.4* 26.3*  ?PROT 4.9* 5.3* 5.4* 5.3* 5.6*  ?ALBUMIN 1.9* 2.9* 3.0* 2.8* 2.8*  ? ?Recent Labs  ?Lab 06/08/21 ?0320  ?LIPASE 50  ? ?Recent Labs  ?Lab 06/08/21 ?0320  ?AMMONIA 112*  ?ABG ?   ?Component Value Date/Time  ? PHART 7.565 (H) 06/11/2021 0940  ? PCO2ART 35.3 06/11/2021 0940  ? PO2ART 123  (H) 06/11/2021 0940  ? HCO3 31.9 (H) 06/11/2021 0940  ? TCO2 33 (H) 06/11/2021 0940  ? O2SAT 99 06/11/2021 0940  ?Coagulation Profile: ?Recent Labs  ?Lab 06/08/21 ?0320 06/09/21 ?7371 06/11/21 ?0235  ?INR 2.4* 2.7* 2.5

## 2021-06-13 DIAGNOSIS — Z7189 Other specified counseling: Secondary | ICD-10-CM

## 2021-06-13 DIAGNOSIS — E440 Moderate protein-calorie malnutrition: Secondary | ICD-10-CM | POA: Insufficient documentation

## 2021-06-13 DIAGNOSIS — Z515 Encounter for palliative care: Secondary | ICD-10-CM

## 2021-06-13 DIAGNOSIS — K7290 Hepatic failure, unspecified without coma: Secondary | ICD-10-CM | POA: Diagnosis not present

## 2021-06-13 DIAGNOSIS — K7460 Unspecified cirrhosis of liver: Secondary | ICD-10-CM | POA: Diagnosis not present

## 2021-06-13 LAB — CBC WITH DIFFERENTIAL/PLATELET
Abs Immature Granulocytes: 0.27 10*3/uL — ABNORMAL HIGH (ref 0.00–0.07)
Basophils Absolute: 0 10*3/uL (ref 0.0–0.1)
Basophils Relative: 0 %
Eosinophils Absolute: 0 10*3/uL (ref 0.0–0.5)
Eosinophils Relative: 0 %
HCT: 20.7 % — ABNORMAL LOW (ref 39.0–52.0)
Hemoglobin: 7.2 g/dL — ABNORMAL LOW (ref 13.0–17.0)
Immature Granulocytes: 2 %
Lymphocytes Relative: 6 %
Lymphs Abs: 0.9 10*3/uL (ref 0.7–4.0)
MCH: 37.7 pg — ABNORMAL HIGH (ref 26.0–34.0)
MCHC: 34.8 g/dL (ref 30.0–36.0)
MCV: 108.4 fL — ABNORMAL HIGH (ref 80.0–100.0)
Monocytes Absolute: 3.6 10*3/uL — ABNORMAL HIGH (ref 0.1–1.0)
Monocytes Relative: 26 %
Neutro Abs: 8.8 10*3/uL — ABNORMAL HIGH (ref 1.7–7.7)
Neutrophils Relative %: 66 %
Platelets: 54 10*3/uL — ABNORMAL LOW (ref 150–400)
RBC: 1.91 MIL/uL — ABNORMAL LOW (ref 4.22–5.81)
WBC: 13.5 10*3/uL — ABNORMAL HIGH (ref 4.0–10.5)
nRBC: 0.9 % — ABNORMAL HIGH (ref 0.0–0.2)

## 2021-06-13 LAB — CULTURE, BLOOD (ROUTINE X 2)
Culture: NO GROWTH
Culture: NO GROWTH
Special Requests: ADEQUATE

## 2021-06-13 LAB — COMPREHENSIVE METABOLIC PANEL
ALT: 104 U/L — ABNORMAL HIGH (ref 0–44)
AST: 222 U/L — ABNORMAL HIGH (ref 15–41)
Albumin: 2.4 g/dL — ABNORMAL LOW (ref 3.5–5.0)
Alkaline Phosphatase: 238 U/L — ABNORMAL HIGH (ref 38–126)
Anion gap: 6 (ref 5–15)
BUN: 55 mg/dL — ABNORMAL HIGH (ref 6–20)
CO2: 29 mmol/L (ref 22–32)
Calcium: 8.9 mg/dL (ref 8.9–10.3)
Chloride: 105 mmol/L (ref 98–111)
Creatinine, Ser: 1.19 mg/dL (ref 0.61–1.24)
GFR, Estimated: >60 mL/min (ref >60)
Glucose, Bld: 165 mg/dL — ABNORMAL HIGH (ref 70–99)
Potassium: 4.9 mmol/L (ref 3.5–5.1)
Sodium: 140 mmol/L (ref 135–145)
Total Bilirubin: 21.1 mg/dL (ref 0.3–1.2)
Total Protein: 5 g/dL — ABNORMAL LOW (ref 6.5–8.1)

## 2021-06-13 LAB — GLUCOSE, CAPILLARY
Glucose-Capillary: 172 mg/dL — ABNORMAL HIGH (ref 70–99)
Glucose-Capillary: 148 mg/dL — ABNORMAL HIGH (ref 70–99)
Glucose-Capillary: 164 mg/dL — ABNORMAL HIGH (ref 70–99)
Glucose-Capillary: 191 mg/dL — ABNORMAL HIGH (ref 70–99)
Glucose-Capillary: 175 mg/dL — ABNORMAL HIGH (ref 70–99)

## 2021-06-13 LAB — PROTIME-INR
INR: 2.2 — ABNORMAL HIGH (ref 0.8–1.2)
Prothrombin Time: 24.2 seconds — ABNORMAL HIGH (ref 11.4–15.2)

## 2021-06-13 LAB — MAGNESIUM: Magnesium: 2.4 mg/dL (ref 1.7–2.4)

## 2021-06-13 LAB — AMMONIA: Ammonia: 34 umol/L (ref 9–35)

## 2021-06-13 LAB — PHOSPHORUS: Phosphorus: 3.6 mg/dL (ref 2.5–4.6)

## 2021-06-13 MED ORDER — LACTULOSE 10 GM/15ML PO SOLN
20.0000 g | Freq: Two times a day (BID) | ORAL | Status: DC
  Administered 2021-06-13 – 2021-06-17 (×8): 20 g via ORAL
  Filled 2021-06-13 (×8): qty 30

## 2021-06-13 MED ORDER — FOLIC ACID 1 MG PO TABS
1.0000 mg | ORAL_TABLET | Freq: Every day | ORAL | Status: DC
  Administered 2021-06-14 – 2021-06-17 (×4): 1 mg via ORAL
  Filled 2021-06-13 (×4): qty 1

## 2021-06-13 MED ORDER — PANTOPRAZOLE 2 MG/ML SUSPENSION: 40.0000 mg | Freq: Every day | ORAL | Status: DC

## 2021-06-13 MED ORDER — RIFAXIMIN 550 MG PO TABS
550.0000 mg | ORAL_TABLET | Freq: Two times a day (BID) | ORAL | Status: DC
  Administered 2021-06-13 – 2021-06-17 (×8): 550 mg via ORAL
  Filled 2021-06-13 (×8): qty 1

## 2021-06-13 MED ORDER — THIAMINE HCL 100 MG PO TABS
100.0000 mg | ORAL_TABLET | Freq: Every day | ORAL | Status: DC
  Administered 2021-06-14 – 2021-06-17 (×4): 100 mg via ORAL
  Filled 2021-06-13 (×4): qty 1

## 2021-06-13 MED ORDER — ADULT MULTIVITAMIN W/MINERALS CH
1.0000 | ORAL_TABLET | Freq: Every day | ORAL | Status: DC
  Administered 2021-06-14 – 2021-06-17 (×4): 1 via ORAL
  Filled 2021-06-13 (×4): qty 1

## 2021-06-13 NOTE — Progress Notes (Signed)
Speech Language Pathology Treatment: Dysphagia  ?Patient Details ?Name: Eric Dennis ?MRN: [id number] ?DOB: September 09, 1965 ?Today's Date: 06/13/2021 ?Time: 1220-1230 ?SLP Time Calculation (min) (ACUTE ONLY): 10 min ? ?Assessment / Plan / Recommendation ?Clinical Impression ? Eric Dennis was seen for dysphagia treatment. He was alert and cooperative during the session and his family was present for part of the session. Eric Dennis tolerated regular texture solids, consecutive swallows of thin liquids via cup and straw without overt s/sx of aspiration. Mastication and oral clearance were WFL. Eric Dennis's diet will be advanced to regular texture solids and thin liquids with some liberalization of swallowing precautions. SLP will continue to follow Eric Dennis.  ?  ?HPI HPI: Eric Dennis is a 56 y/o male who presented to the ED 4/2 with AMS and family reports of half a week hx of worsening AMS with concomitant low PO intake. Eric Dennis intubated due to difficulty with airway protection and incisor noted adjacent to the ETT and vocal folds during larynx visualization' tooth removed. ETT 4/2-4/5. Eric Dennis found to have AKI and acute hepatic encephalopathy. GI consulted due to dark stools in rectal tube' plan to defer EGD unless signso f active bleeding develop. PMH EtOH abuse, alcoholic cirrhosis,  prior esophageal varices s/p banding. MBS 08/28/20: mild oropharyngeal and cervical esophageal dysphagia. Minimal decreased coordination of swallow and swallow triggering at pyriform sinus allowing minimal laryngeal penetration of thin x2 that did not clear larynx. A dysphagia 2 diet with thin liquids recommended and Eric Dennis was then advanced to regular texture solids and thin liquids on 08/30/20. ?  ?   ?SLP Plan ? Continue with current plan of care ? ?  ?  ?Recommendations for follow up therapy are one component of a multi-disciplinary discharge planning process, led by the attending physician.  Recommendations may be updated based on patient status, additional functional criteria and insurance  authorization. ?  ? ?Recommendations  ?Diet recommendations: Regular;Thin liquid ?Liquids provided via: Cup;Straw ?Medication Administration: Whole meds with puree (or via cortrak) ?Supervision: Staff to assist with self feeding ?Compensations: Slow rate;Small sips/bites ?Postural Changes and/or Swallow Maneuvers: Seated upright 90 degrees  ?   ?    ?   ? ? ? ? Oral Care Recommendations: Oral care BID ?Follow Up Recommendations: No SLP follow up ?SLP Visit Diagnosis: Dysphagia, unspecified (R13.10) ?Plan: Continue with current plan of care ? ? ? ? ?  ?  ?Shirrell Solinger I. Hardin Negus, [place], CCC-SLP ?Acute Rehabilitation Services ?Office number [phone] ?Pager [phone] ? ? ?Horton Marshall ? ?06/13/2021, 1:06 PM ? ? ? ? ?

## 2021-06-13 NOTE — Consult Note (Signed)
?Palliative Medicine Inpatient Consult Note ? ?Consulting Provider: Idamae Schuller, MD ? ?Reason for consult:   ?[name] Consult  ?Reason for Consult? goals of care discussions  ? ?HPI:  ?Per intake H&P --> 56 year old male with past medical hx of alcohol use c/b alcoholic cirrhosis and prior esophageal varices s/p banding, presented to [facility] ED on 06/08/21 with AMS with family reporting half a week hx of worsening alteration in mental status with concomitant low PO intake. Patient hypotensive in the ED. Lab work showed patient having elevated LFTs, INR, and ammonia, along with AKI, and electrolyte abnormalities. Imaging negative for head injury or evidence of pneumonia. Patient admitted to ICU due to acute decompensated liver cirrhosis and difficulty protecting the airway requiring intubation.  ? ?Palliative care has been asked get involved to further address goals of care. ? ?Clinical Assessment/Goals of Care: ? ?*Please note that this is a verbal dictation therefore any spelling or grammatical errors are due to the "[place]" system interpretation. ? ?I have reviewed medical records including EPIC notes, labs and imaging, received report from bedside RN, assessed the patient.  ?  ?I met with Eric Dennis to further discuss diagnosis prognosis, GOC, EOL wishes, disposition and options. ?  ?I introduced Palliative Medicine as specialized medical care for people living with serious illness. It focuses on providing relief from the symptoms and stress of a serious illness. The goal is to improve quality of life for both the patient and the family. ? ?Medical History Review and Understanding: ? ?Eric Dennis shares that he understands his liver is in "bad shape" and it'll "take you off this earth". He shares that he has been drinking "mostly water and some beer" which got him into this state. He is aware of his liver cirrhosis. He reviewed that he "woke up yesterday" and  didn't recall events preceding this nor that he had been in the hospital for four days at that time.  ? ?Social History: ? ?Eric Dennis lives in [place], [place]. He has been married to his wife, Eric Dennis for the past five years. This is his second marriage. He has children from his first marriage.   - son(s). He formerly worked in [employer] though he is now on disability. He is a man of faith and practices within christianity.  ? ?Functional and Nutritional State: ? ?Eric Dennis shares he was fully independent of all bADL's prior to hospitalization. ? ?He has had no issues with appetite.  ? ?Advance Directives: ? ?A detailed discussion was had today regarding advanced directives. Patient has never completed these though he would be interested in doing so while here. ? ?Code Status: ? ?Concepts specific to code status, artifical feeding and hydration, continued IV antibiotics and rehospitalization was had.  Patient shares, "I want to live." Reviewed the importance of considering what living may look like after a cardiopulmonary event. Discussed the need for additional conversations with his family.  ? ?Provided "Hard Choices for Loving People" booklet and a MOST form. Discussed review and completion of MOST in the oncoming days.  ? ?Discussion: ? ?A formal review of patients poor health condition in the setting of his liver cirrhosis was held. We reviewed that this is not a disease at this severity which will be cured. Although he can be to some degree maintained there is a high probability of recurrent readmissions to the hospital.  ? ?We reviewed the need for additional conversations with family involved. Patient consented to calling  his wife to arrange a time.  ? ?Discussed the importance of continued conversation with family and their  medical providers regarding overall plan of care and treatment options, ensuring decisions are within the context of the patients values and  GOCs. ?__________________________________ ?Addendum: ? ?Patients spouse, Eric Dennis called and updated on the conversation above. She is in agreement with meeting tomorrow at [facility] for further conversations.  ? ?Decision Maker: ? ?SUMMARY OF RECOMMENDATIONS   ?Full Code --> For the time being continue this conversation tomorrow ? ?Plan to meet with patient and his spouse tomorrow at [place] ? ?Ongoing Palliative Support ? ?Code Status/Advance Care Planning: ?FULL CODE ?  ?Palliative Prophylaxis:  ?Aspiration, Bowel Regimen, Delirium Protocol, Frequent Pain Assessment, Oral Care, Palliative Wound Care, and Turn Reposition ? ?Additional Recommendations (Limitations, Scope, Preferences): ?Treat what is treatable ? ?Psycho-social/Spiritual:  ?Desire for further Chaplaincy support: No ?Additional Recommendations: Education on cirrhosis ?  ?Prognosis: Poor in the setting of progressive cirrhosis with MELD of 33 increased 3 month mortality risk ? ?Discharge Planning: Unclear ? ?Vitals:  ? 06/13/21 0825 06/13/21 1213  ?BP: 112/60 127/68  ?Pulse: 87   ?Resp: 17   ?Temp: 97.7 ?F (36.5 ?C) 97.7 ?F (36.5 ?C)  ?SpO2: 96%   ? ? ?Intake/Output Summary (Last 24 hours) at 06/13/2021 1442 ?Last data filed at 06/13/2021 0600 ?Gross per 24 hour  ?Intake 1997.23 ml  ?Output 850 ml  ?Net 1147.23 ml  ? ?Last Weight  Most recent update: 06/13/2021  7:01 AM  ? ? Weight  ?86.4 kg (190 lb 7.6 oz)  ?      ? ?  ? ?Gen:  Middle aged M in NAD ?HEENT: Sclera yellow, moist mucous membranes ?CV: Regular rate and rhythm ?PULM: On RA breathing is even and non-labored ?ABD: soft/nontender/ ?EXT: No edema ?Neuro: Alert and oriented x3 ? ?PPS: ? ? ?This conversation/these recommendations were discussed with patient primary care team, Dr. Sloan Dennis ? ?MDM High ?______________________________________________________ ?Eric Dennis ?Eric Dennis Team ?Team Cell Phone: [phone] ?Please utilize secure chat with additional questions, if there is no  response within 30 minutes please call the above phone number ? ?Palliative Medicine Team providers are available by phone from 7am to 7pm daily and can be reached through the team cell phone.  ?Should this patient require assistance outside of these hours, please call the patient's attending physician. ? ? ?

## 2021-06-13 NOTE — Progress Notes (Signed)
?PROGRESS NOTE ? ? ? ?Eric Dennis  FTD:[id number] DOB: 20-Apr-1965 DOA: 06/08/2021 ?PCP: Scheryl Marten, PA  ? ? ?Brief Narrative:  ?56 year old with history of alcohol abuse complicated with alcoholic cirrhosis and prior esophageal varices status post banding presented to emergency room on 4/2 with altered mental status, poor oral intake ongoing for more than a week.  In the emergency room he was found with greatly elevated LFTs, elevated INR and pneumonia along with AKI, he was hypotensive.  Admitted to ICU due to acute decompensated liver cirrhosis and difficulty protecting the airway requiring intubation. ? ?04/02 Presented to the ED with worsening AMS and difficulty protecting the airway. Patient intubated. Transferred to ICU given acute risk for decompensation. AKI improved with IVF. ?04/03 Nephrology consulted for worsening AKI 2.20-->3.14.  ?04/04 Pt had hgb drop less to than 7 requiring 1 unit pRBC. Weaned from ventilator until 8 pm. ?04/05 Patient weaned again and tolerated it well, he was extubated later. ?04/06 Patient remained stable off intubation, transferred to medical floor with NG tube and rectal tube on. ? ? ? ?Assessment & Plan: ?  ?Acute hepatic encephalopathy with underlying decompensated alcoholic cirrhosis, acute alcoholic hepatitis, metabolic alkalosis, coagulopathy from liver failure.  Pancytopenia and thrombocytopenia.  Hyperbilirubinemia and hyperammonemia. ? ?MELD score 33, poor prognosis, recent alcohol use. ?1 unit FFP given to reverse coagulopathy.  Currently no evidence of active bleeding. ?Remains on lactulose titrated for loose bowel movement, on rifaximin.  Remains on Rocephin 1 g IV, will continue 5 days of therapy for SBP prophylaxis. ?Patient is currently on Solu-Medrol for acute alcoholic hepatitis, somehow improvement of bilirubin, transaminases remain elevated.  Coagulopathy remains stable. ?Patient was seen by gastroenterology, will discuss about discharge steroid  regimen and follow-up. ?Poor prognosis.  Not a transplant candidate.  Palliative discussion today. ?Discontinue core track.  Allow regular diet.  Ambulate with PT OT for discharge planning. ? ?Acute kidney injury secondary to ischemic ATN, hypernatremia: ?Gradually improving. ? ?Severe protein calorie malnutrition, failure to thrive: Encourage oral intake.  Poor prognosis. ? ?Anemia of critical illness, acute on chronic anemia of GI blood loss, upper GI bleeding: ?1 unit PRBC 4/5, currently on Protonix.  No evidence of ongoing bleeding.  Recheck tomorrow. ? ? ?DVT prophylaxis: SCDs Start: 06/08/21 0814 ? ? ?Code Status: Full code ?Family Communication: Wife on the phone ?Disposition Plan: Status is: Inpatient ?Remains inpatient appropriate because: Critically ill ?  ? ? ?Consultants:  ?Gastroenterology, signed off ?Nephrology, signed off ?PCCM ? ?Procedures:  ?Mechanical intubation ? ?Antimicrobials:  ?Rocephin 4/2-- ?Rifaximin long-term ? ? ?Subjective: ?Patient seen and examined on the morning rounds.  I went to examine him again in the afternoon.  He is alert awake and oriented.  He was able to eat.  Denied any chest pain or palpitations.  Denies any nausea vomiting.  Eager to get out of the bed.  He still has some nausea.  Denies any abdomen pain or discomfort. ? ?Objective: ?Vitals:  ? 06/13/21 0400 06/13/21 0500 06/13/21 0825 06/13/21 1213  ?BP: 116/61  112/60 127/68  ?Pulse: 84  87   ?Resp:   17   ?Temp:   97.7 ?F (36.5 ?C) 97.7 ?F (36.5 ?C)  ?TempSrc:   Oral Axillary  ?SpO2:   96%   ?Weight:  86.4 kg    ?Height:      ? ? ?Intake/Output Summary (Last 24 hours) at 06/13/2021 1514 ?Last data filed at 06/13/2021 0600 ?Gross per 24 hour  ?Intake 1997.23 ml  ?  Output 850 ml  ?Net 1147.23 ml  ? ?Filed Weights  ? 06/10/21 0447 06/12/21 0429 06/13/21 0500  ?Weight: 85.7 kg 78.9 kg 86.4 kg  ? ? ?Examination: ? ?General exam: Frail, debilitated.  Icteric. ?Respiratory system: Clear to auscultation. Respiratory effort  normal.  No added sounds. ?Cardiovascular system: S1 & S2 heard, RRR.  No peripheral edema.   ?Gastrointestinal system: Soft.  Nontender.  Bowel sound present.  Mildly distended. ?Central nervous system: Alert and oriented. No focal neurological deficits. ?Without any tremors or rigidity. ?Extremities: Symmetric 5 x 5 power.  Generalized weak.  No focal deficits. ? ? ? ?Data Reviewed: I have personally reviewed following labs and imaging studies ? ?CBC: ?Recent Labs  ?Lab 06/08/21 ?0320 06/08/21 ?1556 06/09/21 ?0962 06/10/21 ?0448 06/10/21 ?1154 06/11/21 ?0235 06/11/21 ?8366 06/12/21 ?2947 06/13/21 ?6546  ?WBC 11.2*   < > 10.4 10.0  --  11.4*  --  13.3* 13.5*  ?NEUTROABS 8.6*  --   --  6.4  --  6.5  --  7.9* 8.8*  ?HGB 7.9*   < > 7.2* 6.9* 7.4* 7.6* 6.8* 8.4* 7.2*  ?HCT 22.8*   < > 19.3* 19.3* 20.7* 21.2* 20.0* 24.7* 20.7*  ?MCV 122.6*   < > 102.7* 106.0*  --  105.5*  --  109.8* 108.4*  ?PLT 118*   < > 65* 68*  --  71*  --  57* 54*  ? < > = values in this interval not displayed.  ? ?Basic Metabolic Panel: ?Recent Labs  ?Lab 06/08/21 ?0320 06/08/21 ?1025 06/09/21 ?5035 06/10/21 ?4656 06/11/21 ?0235 06/11/21 ?8127 06/12/21 ?5170 06/13/21 ?0174  ?NA 139   < > 133* 139 146* 152* 152* 140  ?K 3.5   < > 3.6 3.6 4.1 4.0 4.7 4.9  ?CL 90*   < > 93* 100 111  --  117* 105  ?CO2 30   < > '25 26 29  '$ --  31 29  ?GLUCOSE 155*   < > 183* 221* 292*  --  197* 165*  ?BUN 63*   < > 103* 113* 81*  --  63* 55*  ?CREATININE 1.55*   < > 3.14* 2.11* 1.41*  --  1.25* 1.19  ?CALCIUM 10.4*   < > 8.3* 8.1* 9.1  --  9.7 8.9  ?MG 1.4*  --  1.8 2.1  --   --   --  2.4  ?PHOS  --   --   --  4.2  --   --   --  3.6  ? < > = values in this interval not displayed.  ? ?GFR: ?Estimated Creatinine Clearance: 83.7 mL/min (by C-G formula based on SCr of 1.19 mg/dL). ?Liver Function Tests: ?Recent Labs  ?Lab 06/09/21 ?9449 06/10/21 ?6759 06/11/21 ?1638 06/12/21 ?4665 06/13/21 ?9935  ?AST 195* 217* 248* 281* 222*  ?ALT 57* 69* 78* 105* 104*  ?ALKPHOS 54 52 97  237* 238*  ?BILITOT 20.9* 22.6* 25.4* 26.3* 21.1*  ?PROT 5.3* 5.4* 5.3* 5.6* 5.0*  ?ALBUMIN 2.9* 3.0* 2.8* 2.8* 2.4*  ? ?Recent Labs  ?Lab 06/08/21 ?0320  ?LIPASE 50  ? ?Recent Labs  ?Lab 06/08/21 ?0320 06/13/21 ?0816  ?AMMONIA 112* 34  ? ?Coagulation Profile: ?Recent Labs  ?Lab 06/08/21 ?0320 06/09/21 ?7017 06/11/21 ?0235 06/13/21 ?7939  ?INR 2.4* 2.7* 2.5* 2.2*  ? ?Cardiac Enzymes: ?No results for input(s): CKTOTAL, CKMB, CKMBINDEX, TROPONINI in the last 168 hours. ?BNP (last 3 results) ?No results for input(s): PROBNP in the last 8760  hours. ?HbA1C: ?No results for input(s): HGBA1C in the last 72 hours. ?CBG: ?Recent Labs  ?Lab 06/12/21 ?2031 06/12/21 ?2327 06/13/21 ?0335 06/13/21 ?1610 06/13/21 ?1215  ?GLUCAP 260* 263* 172* 148* 164*  ? ?Lipid Profile: ?No results for input(s): CHOL, HDL, LDLCALC, TRIG, CHOLHDL, LDLDIRECT in the last 72 hours. ?Thyroid Function Tests: ?No results for input(s): TSH, T4TOTAL, FREET4, T3FREE, THYROIDAB in the last 72 hours. ?Anemia Panel: ?No results for input(s): VITAMINB12, FOLATE, FERRITIN, TIBC, IRON, RETICCTPCT in the last 72 hours. ?Sepsis Labs: ?No results for input(s): PROCALCITON, LATICACIDVEN in the last 168 hours. ? ?Recent Results (from the past 240 hour(s))  ?Culture, blood (routine x 2)     Status: None (Preliminary result)  ? Collection Time: 06/08/21  3:20 AM  ? Specimen: BLOOD LEFT ARM  ?Result Value Ref Range Status  ? Specimen Description BLOOD LEFT ARM  Final  ? Special Requests   Final  ?  BOTTLES DRAWN AEROBIC AND ANAEROBIC Blood Culture results may not be optimal due to an inadequate volume of blood received in culture bottles  ? Culture   Final  ?  NO GROWTH 4 DAYS ?Performed at [facility], [place] [address], [place], [place] [address] ?  ? Report Status PENDING  Incomplete  ?Culture, blood (routine x 2)     Status: None (Preliminary result)  ? Collection Time: 06/08/21  3:49 AM  ? Specimen: BLOOD  ?Result Value Ref Range Status  ? Specimen Description  BLOOD SITE NOT SPECIFIED  Final  ? Special Requests   Final  ?  BOTTLES DRAWN AEROBIC AND ANAEROBIC Blood Culture adequate volume  ? Culture   Final  ?  NO GROWTH 4 DAYS ?Performed at [facility], [address]

## 2021-06-14 ENCOUNTER — Encounter (HOSPITAL_COMMUNITY): Payer: Self-pay | Admitting: Pulmonary Disease

## 2021-06-14 ENCOUNTER — Other Ambulatory Visit: Payer: Self-pay

## 2021-06-14 DIAGNOSIS — K7460 Unspecified cirrhosis of liver: Secondary | ICD-10-CM | POA: Diagnosis not present

## 2021-06-14 DIAGNOSIS — Z7189 Other specified counseling: Secondary | ICD-10-CM | POA: Diagnosis not present

## 2021-06-14 DIAGNOSIS — Z515 Encounter for palliative care: Secondary | ICD-10-CM | POA: Diagnosis not present

## 2021-06-14 DIAGNOSIS — K7290 Hepatic failure, unspecified without coma: Secondary | ICD-10-CM | POA: Diagnosis not present

## 2021-06-14 LAB — CBC WITH DIFFERENTIAL/PLATELET
Abs Immature Granulocytes: 0.19 10*3/uL — ABNORMAL HIGH (ref 0.00–0.07)
Abs Immature Granulocytes: 0.27 10*3/uL — ABNORMAL HIGH (ref 0.00–0.07)
Basophils Absolute: 0 10*3/uL (ref 0.0–0.1)
Basophils Absolute: 0 10*3/uL (ref 0.0–0.1)
Basophils Relative: 0 %
Basophils Relative: 0 %
Eosinophils Absolute: 0 10*3/uL (ref 0.0–0.5)
Eosinophils Absolute: 0 10*3/uL (ref 0.0–0.5)
Eosinophils Relative: 0 %
Eosinophils Relative: 0 %
HCT: 21.2 % — ABNORMAL LOW (ref 39.0–52.0)
HCT: 22.7 % — ABNORMAL LOW (ref 39.0–52.0)
Hemoglobin: 7 g/dL — ABNORMAL LOW (ref 13.0–17.0)
Hemoglobin: 7.6 g/dL — ABNORMAL LOW (ref 13.0–17.0)
Immature Granulocytes: 2 %
Immature Granulocytes: 2 %
Lymphocytes Relative: 7 %
Lymphocytes Relative: 4 %
Lymphs Abs: 0.8 10*3/uL (ref 0.7–4.0)
Lymphs Abs: 0.5 10*3/uL — ABNORMAL LOW (ref 0.7–4.0)
MCH: 35.4 pg — ABNORMAL HIGH (ref 26.0–34.0)
MCH: 36.2 pg — ABNORMAL HIGH (ref 26.0–34.0)
MCHC: 33 g/dL (ref 30.0–36.0)
MCHC: 33.5 g/dL (ref 30.0–36.0)
MCV: 107.1 fL — ABNORMAL HIGH (ref 80.0–100.0)
MCV: 108.1 fL — ABNORMAL HIGH (ref 80.0–100.0)
Monocytes Absolute: 2.1 10*3/uL — ABNORMAL HIGH (ref 0.1–1.0)
Monocytes Absolute: 2 10*3/uL — ABNORMAL HIGH (ref 0.1–1.0)
Monocytes Relative: 18 %
Monocytes Relative: 15 %
Neutro Abs: 8.6 10*3/uL — ABNORMAL HIGH (ref 1.7–7.7)
Neutro Abs: 10.3 10*3/uL — ABNORMAL HIGH (ref 1.7–7.7)
Neutrophils Relative %: 73 %
Neutrophils Relative %: 79 %
Platelets: 47 10*3/uL — ABNORMAL LOW (ref 150–400)
Platelets: 51 10*3/uL — ABNORMAL LOW (ref 150–400)
RBC: 1.98 MIL/uL — ABNORMAL LOW (ref 4.22–5.81)
RBC: 2.1 MIL/uL — ABNORMAL LOW (ref 4.22–5.81)
RDW: 25.3 % — ABNORMAL HIGH (ref 11.5–15.5)
Smear Review: DECREASED
WBC: 11.7 10*3/uL — ABNORMAL HIGH (ref 4.0–10.5)
WBC: 13.1 10*3/uL — ABNORMAL HIGH (ref 4.0–10.5)
nRBC: 0.8 % — ABNORMAL HIGH (ref 0.0–0.2)
nRBC: 0.8 % — ABNORMAL HIGH (ref 0.0–0.2)

## 2021-06-14 LAB — COMPREHENSIVE METABOLIC PANEL
ALT: 111 U/L — ABNORMAL HIGH (ref 0–44)
AST: 200 U/L — ABNORMAL HIGH (ref 15–41)
Albumin: 2.3 g/dL — ABNORMAL LOW (ref 3.5–5.0)
Alkaline Phosphatase: 198 U/L — ABNORMAL HIGH (ref 38–126)
Anion gap: 3 — ABNORMAL LOW (ref 5–15)
BUN: 56 mg/dL — ABNORMAL HIGH (ref 6–20)
CO2: 29 mmol/L (ref 22–32)
Calcium: 8.9 mg/dL (ref 8.9–10.3)
Chloride: 104 mmol/L (ref 98–111)
Creatinine, Ser: 1.18 mg/dL (ref 0.61–1.24)
GFR, Estimated: >60 mL/min (ref >60)
Glucose, Bld: 147 mg/dL — ABNORMAL HIGH (ref 70–99)
Potassium: 5.4 mmol/L — ABNORMAL HIGH (ref 3.5–5.1)
Sodium: 136 mmol/L (ref 135–145)
Total Bilirubin: 20.5 mg/dL (ref 0.3–1.2)
Total Protein: 5.1 g/dL — ABNORMAL LOW (ref 6.5–8.1)

## 2021-06-14 LAB — GLUCOSE, CAPILLARY
Glucose-Capillary: 115 mg/dL — ABNORMAL HIGH (ref 70–99)
Glucose-Capillary: 124 mg/dL — ABNORMAL HIGH (ref 70–99)
Glucose-Capillary: 150 mg/dL — ABNORMAL HIGH (ref 70–99)
Glucose-Capillary: 179 mg/dL — ABNORMAL HIGH (ref 70–99)
Glucose-Capillary: 187 mg/dL — ABNORMAL HIGH (ref 70–99)
Glucose-Capillary: 205 mg/dL — ABNORMAL HIGH (ref 70–99)

## 2021-06-14 LAB — MAGNESIUM: Magnesium: 2.5 mg/dL — ABNORMAL HIGH (ref 1.7–2.4)

## 2021-06-14 LAB — PHOSPHORUS: Phosphorus: 4.4 mg/dL (ref 2.5–4.6)

## 2021-06-14 MED ORDER — INSULIN GLARGINE-YFGN 100 UNIT/ML ~~LOC~~ SOLN
20.0000 [IU] | Freq: Every day | SUBCUTANEOUS | Status: DC
Start: 2021-06-14 — End: 2021-06-17
  Administered 2021-06-14 – 2021-06-17 (×4): 20 [IU] via SUBCUTANEOUS
  Filled 2021-06-14 (×4): qty 0.2

## 2021-06-14 MED ORDER — PANTOPRAZOLE SODIUM 40 MG PO TBEC
40.0000 mg | DELAYED_RELEASE_TABLET | Freq: Every day | ORAL | Status: DC
  Administered 2021-06-14 – 2021-06-17 (×4): 40 mg via ORAL
  Filled 2021-06-14 (×4): qty 1

## 2021-06-14 MED ORDER — PREDNISONE 20 MG PO TABS
40.0000 mg | ORAL_TABLET | Freq: Every day | ORAL | Status: DC
  Administered 2021-06-14 – 2021-06-17 (×4): 40 mg via ORAL
  Filled 2021-06-14 (×4): qty 2

## 2021-06-14 NOTE — Progress Notes (Addendum)
? ?Palliative Medicine Inpatient Follow Up Note ? ?HPI: ? 56 year old male with past medical hx of alcohol use c/b alcoholic cirrhosis and prior esophageal varices s/p banding, presented to [facility] ED on 06/08/21 with AMS with family reporting half a week hx of worsening alteration in mental status with concomitant low PO intake. Patient hypotensive in the ED. Lab work showed patient having elevated LFTs, INR, and ammonia, along with AKI, and electrolyte abnormalities. Imaging negative for head injury or evidence of pneumonia. Patient admitted to ICU due to acute decompensated liver cirrhosis and difficulty protecting the airway requiring intubation.  ?  ?Palliative care has been asked get involved to further address goals of care. ? ?Today's Discussion (06/14/2021): ? ?*Please note that this is a verbal dictation therefore any spelling or grammatical errors are due to the "[place] [employer]" system interpretation. ? ?Chart reviewed inclusive of vital signs, progress notes, laboratory results, and diagnostic images.  ? ?I met with Eric Dennis and his wife, Eric Dennis this morning. We reviewed the significance of his liver disease. I shared that he has liver disease which carries with it a poor prognosis. We reviewed that he is seeing the effects of a serious disease. I shared that this is something that will likely lead to the end of his life possibly in a short period of time. ? ?Eric Dennis continues to perseverate on stopping drinking. I shared that damage has been done and although ceasing alcohol intake is a good next step it will not solve the issues at hand presently. We further reviewed that per the GI team he is not a transplant candidate. I shared that further communication with his own GI doctor moving forward would be of value. His wife shares that she have an appointment next week. ? ?We reviewed the importance of advance care planning. I shared this will help instruct and guide families at to what Eric Dennis' wishes are  into the future. ? ?We formally reviewed code status. I encouraged Eric Dennis to consider DNR/DNI status understanding evidenced based poor outcomes in similar hospitalized patient, as the cause of arrest is likely associated with advanced chronic/terminal illness rather than an easily reversible acute cardio-pulmonary event. I explained that DNR/DNI does not change the medical plan and it only comes into effect after a person has arrested (died).  It is a protective measure to keep [place] from harming the patient in their last moments of life. ? ?WE reviewed the differences between hospice and palliative care. Patient does not wish to go onto hospice. He is however agreeable to OP Palliative support to continue this important conversations.  ? ?Patients wife asks if she can convert to being patients primary caregiver as she is a [employer]. I shared that I do not know the details of how that works from a reimbursement perspective though I would ask our [facility] team to better help navigate this questions.  ? ?Questions and concerns addressed  ? ?Palliative Support Provided ? ?Objective Assessment: ?Vital Signs ?Vitals:  ? 06/14/21 0340 06/14/21 0749  ?BP: 108/66 111/66  ?Pulse: 84 79  ?Resp: 15 18  ?Temp: 98.6 ?F (37 ?C) 98.6 ?F (37 ?C)  ?SpO2: 97% 97%  ? ? ?Intake/Output Summary (Last 24 hours) at 06/14/2021 0909 ?Last data filed at 06/14/2021 0600 ?Gross per 24 hour  ?Intake 360 ml  ?Output 750 ml  ?Net -390 ml  ? ?Last Weight  Most recent update: 06/14/2021  6:35 AM  ? ? Weight  ?90.3 kg (199 lb 1.2 oz)  ?      ? ?  ? ?  Gen:  Middle aged M in NAD ?HEENT: Sclera yellow, moist mucous membranes ?CV: Regular rate and rhythm ?PULM: On RA breathing is even and non-labored ?ABD: soft/nontender ?EXT: No edema ?Neuro: Alert and oriented x3 ? ?SUMMARY OF RECOMMENDATIONS   ?Full Code --> Recommended patient consider DNAR/DNI in the setting of his chronic cirrhosis with no cure and presently no options for transplant consideration ? ?A MOST and  Advance Directives were provided to patient and spouse, I shared the value and importance of completing these. ? ?Reviewed significance of cirrhosis associated with mortality risk ? ?Reviewed the differences between Palliative care and Hospice ? ?TOC - OP Palliative support ? ?Appreciate TOC team as patients wife interested in becoming patients full time caregiver. ?  ?Ongoing Palliative Support ? ?MDM - High ? ?Total Time: 66 ?______________________________________________________________________________________ ?Tacey Ruiz ?[facility] Team ?Team Cell Phone: [phone] ?Please utilize secure chat with additional questions, if there is no response within 30 minutes please call the above phone number ? ?Palliative Medicine Team providers are available by phone from 7am to 7pm daily and can be reached through the team cell phone.  ?Should this patient require assistance outside of these hours, please call the patient's attending physician. ? ? ? ? ?

## 2021-06-14 NOTE — Evaluation (Signed)
Occupational Therapy Evaluation ?Patient Details ?Name: Eric Dennis ?MRN: [id number] ?DOB: August 23, 1965 ?Today's Date: 06/14/2021 ? ? ?History of Present Illness Pt is a 56 y.o. male admitted 4/2 with decompensated hepatic cirrhosis. Required intubation 4/2-4/5. PMH: alcoholic cirrhosis, prior esophageal varices s/p banding  ? ?Clinical Impression ?  ?Eric Dennis was evaluated s/p the above admission list, he is generally indep at baseline and lives with his wife and mother in [place]. Upon evaluation he required mod A for bed mobility and simple transfers. He also required up to mod A for ADLs due to generalized weakness, poor activity tolerance and impaired balance. Pt also presents with poor insight to deficits and safety. He will benefit from continued OT to address the limitations listed below. Recommend d/c to AIR to progress towards mod I prior to d/c home.  ?   ? ?Recommendations for follow up therapy are one component of a multi-disciplinary discharge planning process, led by the attending physician.  Recommendations may be updated based on patient status, additional functional criteria and insurance authorization.  ? ?Follow Up Recommendations ? Acute inpatient rehab (3hours/day)  ?  ?Assistance Recommended at Discharge Frequent or constant Supervision/Assistance  ?Patient can return home with the following A little help with walking and/or transfers;A little help with bathing/dressing/bathroom;Assistance with cooking/housework;Direct supervision/assist for medications management;Direct supervision/assist for financial management;Assist for transportation ? ?  ?Functional Status Assessment ? Patient has had a recent decline in their functional status and demonstrates the ability to make significant improvements in function in a reasonable and predictable amount of time.  ?Equipment Recommendations ? BSC/3in1;Other (comment) (RW)  ?  ?Recommendations for Other Services Rehab consult ? ? ?  ?Precautions / Restrictions  Precautions ?Precautions: Fall;Other (comment) ?Precaution Comments: multiple falls at home ?Restrictions ?Weight Bearing Restrictions: No  ? ?  ? ?Mobility Bed Mobility ?Overal bed mobility: Needs Assistance ?Bed Mobility: Supine to Sit, Sit to Supine ?  ?  ?Supine to sit: Mod assist ?Sit to supine: Mod assist ?  ?  ?  ? ?Transfers ?Overall transfer level: Needs assistance ?Equipment used: Rolling walker (2 wheels) ?Transfers: Sit to/from Stand ?Sit to Stand: Mod assist ?  ?  ?  ?  ?  ?  ?  ? ?  ?Balance Overall balance assessment: Needs assistance ?Sitting-balance support: No upper extremity supported, Feet supported ?Sitting balance-Leahy Scale: Good ?  ?  ?Standing balance support: Reliant on assistive device for balance, Bilateral upper extremity supported, During functional activity ?Standing balance-Leahy Scale: Poor ?  ?  ?  ?  ?  ?  ?  ?  ?  ?  ?  ?  ?   ? ?ADL either performed or assessed with clinical judgement  ? ?ADL Overall ADL's : Needs assistance/impaired ?Eating/Feeding: Set up;Sitting ?  ?Grooming: Set up;Sitting ?  ?Upper Body Bathing: Minimal assistance;Sitting ?  ?Lower Body Bathing: Moderate assistance;Sit to/from stand ?  ?Upper Body Dressing : Set up;Sitting ?  ?Lower Body Dressing: Moderate assistance;Sit to/from stand ?  ?Toilet Transfer: Minimal assistance;Stand-pivot;BSC/3in1 ?Toilet Transfer Details (indicate cue type and reason): pivotal steps ?Toileting- Clothing Manipulation and Hygiene: Min guard;Sitting/lateral lean ?  ?  ?  ?Functional mobility during ADLs: Minimal assistance ?General ADL Comments: limited by generalized weakness and poor activity tolerance. pt does not see/understand his limitations and is a high fall risk  ? ? ? ?Vision Baseline Vision/History: 0 No visual deficits ?Vision Assessment?: Vision impaired- to be further tested in functional context;No apparent visual deficits  ?   ? ?  Pertinent Vitals/Pain Pain Assessment ?Pain Assessment: No/denies pain  ? ? ? ?Hand  Dominance   ?  ?Extremity/Trunk Assessment Upper Extremity Assessment ?Upper Extremity Assessment: Generalized weakness ?  ?Lower Extremity Assessment ?Lower Extremity Assessment: Generalized weakness ?  ?Cervical / Trunk Assessment ?Cervical / Trunk Assessment: Normal ?  ?Communication Communication ?Communication: No difficulties ?  ?Cognition Arousal/Alertness: Awake/alert ?Behavior During Therapy: [facility] for tasks assessed/performed ?Overall Cognitive Status: No family/caregiver present to determine baseline cognitive functioning ?  ?  ?  ?  ?  ?  ?  ?  ?  ?  ?  ?  ?  ?  ?  ?  ?General Comments: poor insight and attention. requires cues. does well with one step commands. perseverating on going home. Despite heavy assist and poor activity tolerance pt states "i can go check the mail by myself, or take out the trash." ?  ?  ?General Comments  VSS on RA, pt with extremely limited insight ? ?  ? ?Home Living Family/patient expects to be discharged to:: Private residence ?Living Arrangements: Spouse/significant other ?Available Help at Discharge: Family;Available 24 hours/day ?Type of Home: House ?Home Access: Level entry ?  ?  ?Home Layout: One level ?  ?  ?Bathroom Shower/Tub: Tub/shower unit ?  ?Bathroom Toilet: Handicapped height ?  ?  ?Home Equipment: Conservation officer, nature (2 wheels);Shower seat ?  ?Additional Comments: states his wife works and his [place] is there 24/7 ?  ? ?  ?Prior Functioning/Environment Prior Level of Function : Independent/Modified Independent ?  ?  ?  ?  ?  ?  ?Mobility Comments: does not drive. Able to ambulate community distances but does tire quickly with grocery shopping. ?ADLs Comments: indep but reportrs falling ?  ? ?  ?  ?OT Problem List: Decreased strength;Decreased range of motion;Decreased activity tolerance;Impaired balance (sitting and/or standing);Decreased cognition;Decreased safety awareness;Decreased knowledge of use of DME or AE;Decreased knowledge of precautions ?  ?   ?OT  Treatment/Interventions: Self-care/ADL training;Therapeutic exercise;DME and/or AE instruction;Patient/family education;Balance training;Therapeutic activities  ?  ?OT Goals(Current goals can be found in the care plan section) Acute Rehab OT Goals ?Patient Stated Goal: home ?OT Goal Formulation: With patient ?Time For Goal Achievement: 06/28/21 ?Potential to Achieve Goals: Good ?ADL Goals ?Pt Will Perform Grooming: with supervision;standing ?Pt Will Perform Upper Body Dressing: with modified independence;sitting ?Pt Will Perform Lower Body Dressing: with modified independence;sit to/from stand ?Pt Will Transfer to Toilet: with modified independence;ambulating ?Pt/caregiver will Perform Home Exercise Program: Increased strength;Both right and left upper extremity;With written HEP provided ?Additional ADL Goal #1: Pt will demonstrate incrased activity tolerance to perform 3 grooming tasks in standing with supervision A  ?OT Frequency: Min 2X/week ?  ? ?Co-evaluation   ?  ?  ?  ?  ? ?  ?AM-PAC OT "6 Clicks" Daily Activity     ?Outcome Measure Help from another person eating meals?: A Little ?Help from another person taking care of personal grooming?: A Little ?Help from another person toileting, which includes using toliet, bedpan, or urinal?: A Little ?Help from another person bathing (including washing, rinsing, drying)?: A Lot ?Help from another person to put on and taking off regular upper body clothing?: A Little ?Help from another person to put on and taking off regular lower body clothing?: A Lot ?6 Click Score: 16 ?  ?End of Session Equipment Utilized During Treatment: Gait belt ?Nurse Communication: Mobility status ? ?Activity Tolerance: Patient limited by fatigue ?Patient left: in bed;with bed alarm set;with call  bell/phone within reach ? ?OT Visit Diagnosis: Unsteadiness on feet (R26.81);Other abnormalities of gait and mobility (R26.89);Repeated falls (R29.6);Muscle weakness (generalized) (M62.81);History of  falling (Z91.81)  ?              ?Time: 4650-3546 ?OT Time Calculation (min): 11 min ?Charges:  OT General Charges ?$OT Visit: 1 Visit ?OT Evaluation ?$OT Eval Moderate Complexity: 1 Mod ? ? ?Zemira Zehring A Droes

## 2021-06-14 NOTE — Progress Notes (Signed)
Inpatient Rehab Admissions Coordinator:  ? ?CIR consult received. At this time, Pt. Has not yet been seen by PT/OT, so I cannot make an assessment of candidacy. I will follow up with Pt. And update chart once PT and OT have completed their evaluations. ? ?Clemens Catholic, MS, CCC-SLP ?Rehab Admissions Coordinator  ?[phone] (celll) ?[phone] (office) ? ?

## 2021-06-14 NOTE — Progress Notes (Signed)
?PROGRESS NOTE ? ? ? ?Eric Dennis  OJJ:[id number] DOB: 1966-01-03 DOA: 06/08/2021 ?PCP: Scheryl Marten, PA  ? ? ?Brief Narrative:  ?56 year old with history of alcohol abuse complicated with alcoholic cirrhosis and prior esophageal varices status post banding presented to emergency room on 4/2 with altered mental status, poor oral intake ongoing for more than a week.  In the emergency room he was found with greatly elevated LFTs, elevated INR and pneumonia along with AKI, he was hypotensive.  Admitted to ICU due to acute decompensated liver cirrhosis and difficulty protecting the airway requiring intubation. ? ?04/02 Presented to the ED with worsening AMS and difficulty protecting the airway. Patient intubated. Transferred to ICU given acute risk for decompensation. AKI improved with IVF. ?04/03 Nephrology consulted for worsening AKI 2.20-->3.14.  ?04/04 Pt had hgb drop less to than 7 requiring 1 unit pRBC. Weaned from ventilator until 8 pm. ?04/05 Patient weaned again and tolerated it well, he was extubated later. ?04/06 Patient remained stable off intubation, transferred to medical floor with NG tube and rectal tube on. ? ? ? ?Assessment & Plan: ?  ?Acute hepatic encephalopathy with underlying decompensated alcoholic cirrhosis, acute alcoholic hepatitis, metabolic alkalosis, coagulopathy from liver failure.  Pancytopenia and thrombocytopenia.  Hyperbilirubinemia and hyperammonemia. ? ?MELD score 33, poor prognosis, recent alcohol use. ?1 unit FFP given to reverse coagulopathy.  Currently no evidence of active bleeding. ?Remains on lactulose titrated for loose bowel movement, on rifaximin.  Remains on Rocephin 1 g IV, will continue 5 days of therapy for SBP prophylaxis today and stop. ?Patient is currently on Solu-Medrol for acute alcoholic hepatitis, somehow improvement of bilirubin, transaminases remain elevated.  Coagulopathy remains stable. ?Changed to prednisone 40 mg daily for 4 weeks, continue similar  doses until seen by GI as outpatient. ?Poor prognosis.  Not a transplant candidate.  Palliative discussion ongoing. ?Short-term rehab and transition to home.  Outpatient palliative care follow-up recommended. ? ?Acute kidney injury secondary to ischemic ATN, hypernatremia: ?Gradually improving. ? ?Severe protein calorie malnutrition, failure to thrive: Encourage oral intake.  Poor prognosis. ? ?Anemia of critical illness, acute on chronic anemia of GI blood loss, upper GI bleeding: ?1 unit PRBC 4/5, currently on Protonix.  No evidence of ongoing bleeding.   ?Recheck hemoglobin today, transfuse PRBC if less than 7. ? ? ?DVT prophylaxis: SCDs Start: 06/08/21 0814 ? ? ?Code Status: Full code ?Family Communication: Wife at the bedside. ?Disposition Plan: Status is: Inpatient ?Remains inpatient appropriate because: Unsafe discharge planning. ? ?Discontinue cardiac telemetry.  Discontinue continuous pulse ox.  Mobilize with PT OT.  Refer to rehab. ?  ? ? ?Consultants:  ?Gastroenterology, signed off ?Nephrology, signed off ?PCCM ? ?Procedures:  ?Mechanical intubation ? ?Antimicrobials:  ?Rocephin 4/2--/8 ?Rifaximin long-term ? ? ?Subjective: ? ?Seen and examined.  Wife at the bedside.  Overnight no events.  Denies any nausea or vomiting.  Able to eat regular diet.  2 loose stools overnight after lactulose.  Mentation is clear. ?He was able to get out of the bed to the chair with the help of one therapist.  Patient and wife agreed for short-term rehab. ?Patient tells me that he is going to stop drinking alcohol.  Wife and patient both aware about poor prognosis.  They are agreeable for outpatient palliative care follow-up. ? ?Objective: ?Vitals:  ? 06/14/21 0600 06/14/21 0749 06/14/21 0909 06/14/21 1157  ?BP:  111/66 120/60 124/68  ?Pulse:  79  98  ?Resp:  18  16  ?Temp:  98.6 ?  F (37 ?C)  97.9 ?F (36.6 ?C)  ?TempSrc:  Oral  Oral  ?SpO2:  97%  95%  ?Weight: 90.3 kg     ?Height:      ? ? ?Intake/Output Summary (Last 24 hours)  at 06/14/2021 1335 ?Last data filed at 06/14/2021 0909 ?Gross per 24 hour  ?Intake 499.6 ml  ?Output 1150 ml  ?Net -650.4 ml  ? ?Filed Weights  ? 06/12/21 0429 06/13/21 0500 06/14/21 0600  ?Weight: 78.9 kg 86.4 kg 90.3 kg  ? ? ?Examination: ? ?General exam: Frail, debilitated.  Icteric. ?Respiratory system: Clear to auscultation. Respiratory effort normal.  No added sounds. ?On room air.  Sitting in chair. ?Cardiovascular system: S1 & S2 heard, RRR.  No peripheral edema.   ?Gastrointestinal system: Soft.  Nontender.  Bowel sound present.  Mildly distended. ?Central nervous system: Alert and oriented. No focal neurological deficits. ?Without any tremors or rigidity. ?Extremities: Symmetric 5 x 5 power.  Generalized weak.  No focal deficits. ? ? ? ?Data Reviewed: I have personally reviewed following labs and imaging studies ? ?CBC: ?Recent Labs  ?Lab 06/10/21 ?0448 06/10/21 ?1154 06/11/21 ?0235 06/11/21 ?4034 06/12/21 ?7425 06/13/21 ?9563 06/14/21 ?0129  ?WBC 10.0  --  11.4*  --  13.3* 13.5* 11.7*  ?NEUTROABS 6.4  --  6.5  --  7.9* 8.8* 8.6*  ?HGB 6.9*   < > 7.6* 6.8* 8.4* 7.2* 7.0*  ?HCT 19.3*   < > 21.2* 20.0* 24.7* 20.7* 21.2*  ?MCV 106.0*  --  105.5*  --  109.8* 108.4* 107.1*  ?PLT 68*  --  71*  --  57* 54* 47*  ? < > = values in this interval not displayed.  ? ?Basic Metabolic Panel: ?Recent Labs  ?Lab 06/08/21 ?0320 06/08/21 ?1025 06/09/21 ?8756 06/10/21 ?4332 06/11/21 ?0235 06/11/21 ?9518 06/12/21 ?8416 06/13/21 ?6063 06/14/21 ?0129  ?NA 139   < > 133* 139 146* 152* 152* 140 136  ?K 3.5   < > 3.6 3.6 4.1 4.0 4.7 4.9 5.4*  ?CL 90*   < > 93* 100 111  --  117* 105 104  ?CO2 30   < > '25 26 29  '$ --  '31 29 29  '$ ?GLUCOSE 155*   < > 183* 221* 292*  --  197* 165* 147*  ?BUN 63*   < > 103* 113* 81*  --  63* 55* 56*  ?CREATININE 1.55*   < > 3.14* 2.11* 1.41*  --  1.25* 1.19 1.18  ?CALCIUM 10.4*   < > 8.3* 8.1* 9.1  --  9.7 8.9 8.9  ?MG 1.4*  --  1.8 2.1  --   --   --  2.4 2.5*  ?PHOS  --   --   --  4.2  --   --   --  3.6 4.4  ?  < > = values in this interval not displayed.  ? ?GFR: ?Estimated Creatinine Clearance: 84.8 mL/min (by C-G formula based on SCr of 1.18 mg/dL). ?Liver Function Tests: ?Recent Labs  ?Lab 06/10/21 ?0160 06/11/21 ?1093 06/12/21 ?2355 06/13/21 ?7322 06/14/21 ?0129  ?AST 217* 248* 281* 222* 200*  ?ALT 69* 78* 105* 104* 111*  ?ALKPHOS 52 97 237* 238* 198*  ?BILITOT 22.6* 25.4* 26.3* 21.1* 20.5*  ?PROT 5.4* 5.3* 5.6* 5.0* 5.1*  ?ALBUMIN 3.0* 2.8* 2.8* 2.4* 2.3*  ? ?Recent Labs  ?Lab 06/08/21 ?0320  ?LIPASE 50  ? ?Recent Labs  ?Lab 06/08/21 ?0320 06/13/21 ?0816  ?AMMONIA 112* 34  ? ?Coagulation  Profile: ?Recent Labs  ?Lab 06/08/21 ?0320 06/09/21 ?5027 06/11/21 ?0235 06/13/21 ?7412  ?INR 2.4* 2.7* 2.5* 2.2*  ? ?Cardiac Enzymes: ?No results for input(s): CKTOTAL, CKMB, CKMBINDEX, TROPONINI in the last 168 hours. ?BNP (last 3 results) ?No results for input(s): PROBNP in the last 8760 hours. ?HbA1C: ?No results for input(s): HGBA1C in the last 72 hours. ?CBG: ?Recent Labs  ?Lab 06/13/21 ?2003 06/14/21 ?0010 06/14/21 ?8786 06/14/21 ?7672 06/14/21 ?1156  ?GLUCAP 175* 150* 115* 124* 179*  ? ?Lipid Profile: ?No results for input(s): CHOL, HDL, LDLCALC, TRIG, CHOLHDL, LDLDIRECT in the last 72 hours. ?Thyroid Function Tests: ?No results for input(s): TSH, T4TOTAL, FREET4, T3FREE, THYROIDAB in the last 72 hours. ?Anemia Panel: ?No results for input(s): VITAMINB12, FOLATE, FERRITIN, TIBC, IRON, RETICCTPCT in the last 72 hours. ?Sepsis Labs: ?No results for input(s): PROCALCITON, LATICACIDVEN in the last 168 hours. ? ?Recent Results (from the past 240 hour(s))  ?Culture, blood (routine x 2)     Status: None  ? Collection Time: 06/08/21  3:20 AM  ? Specimen: BLOOD LEFT ARM  ?Result Value Ref Range Status  ? Specimen Description BLOOD LEFT ARM  Final  ? Special Requests   Final  ?  BOTTLES DRAWN AEROBIC AND ANAEROBIC Blood Culture results may not be optimal due to an inadequate volume of blood received in culture bottles  ? Culture    Final  ?  NO GROWTH 5 DAYS ?Performed at [facility], [place] [address], [place], [place] [address] ?  ? Report Status 06/13/2021 FINAL  Final  ?Culture, blood (routine x 2)     Status: None  ? [employer]

## 2021-06-14 NOTE — Evaluation (Signed)
Physical Therapy Evaluation ?Patient Details ?Name: Eric Dennis ?MRN: [id number] ?DOB: 1965-12-21 ?Today's Date: 06/14/2021 ? ?History of Present Illness ? Pt is a 56 y.o. male admitted 4/2 with decompensated hepatic cirrhosis. Required intubation 4/2-4/5. PMH: alcoholic cirrhosis, prior esophageal varices s/p banding ?  ?Clinical Impression ? Pt admitted with above diagnosis. PTA pt lived at home with his wife, independent mobility. On eval, he required mod assist bed mobility, mod assist transfers, and min assist ambulation 5' with RW. Pt presents with deficits in strength, balance, and activity tolerance. He has good home support from his wife. He is motivated to participate in therapy, regain independence, and return home. Pt currently with functional limitations due to the deficits listed below (see PT Problem List). Pt will benefit from skilled PT to increase their independence and safety with mobility to allow discharge to the venue listed below.   ?   ?   ? ?Recommendations for follow up therapy are one component of a multi-disciplinary discharge planning process, led by the attending physician.  Recommendations may be updated based on patient status, additional functional criteria and insurance authorization. ? ?Follow Up Recommendations Acute inpatient rehab (3hours/day) ? ?  ?Assistance Recommended at Discharge Frequent or constant Supervision/Assistance  ?Patient can return home with the following ? A lot of help with bathing/dressing/bathroom;A lot of help with walking and/or transfers;Assistance with cooking/housework;Assist for transportation ? ?  ?Equipment Recommendations None recommended by PT  ?Recommendations for Other Services ? Rehab consult  ?  ?Functional Status Assessment Patient has had a recent decline in their functional status and demonstrates the ability to make significant improvements in function in a reasonable and predictable amount of time.  ? ?  ?Precautions / Restrictions  Precautions ?Precautions: Fall;Other (comment) ?Precaution Comments: multiple falls at home  ? ?  ? ?Mobility ? Bed Mobility ?Overal bed mobility: Needs Assistance ?Bed Mobility: Supine to Sit ?  ?  ?Supine to sit: HOB elevated, Mod assist ?  ?  ?General bed mobility comments: cues for sequencing, +rail, increased time, assist with BLE and trunk ?  ? ?Transfers ?Overall transfer level: Needs assistance ?Equipment used: Rolling walker (2 wheels) ?Transfers: Sit to/from Stand ?Sit to Stand: Mod assist ?  ?  ?  ?  ?  ?General transfer comment: cues for hand placement and sequencing, assist to power up and stabilize balance ?  ? ?Ambulation/Gait ?Ambulation/Gait assistance: Min assist ?Gait Distance (Feet): 5 Feet ?Assistive device: Rolling walker (2 wheels) ?Gait Pattern/deviations: Step-through pattern, Decreased stride length ?Gait velocity: decreased ?  ?  ?General Gait Details: unsteady gait with BLE instability/weakness, cues for sequencing, assist to maintain balance ? ?Stairs ?  ?  ?  ?  ?  ? ?Wheelchair Mobility ?  ? ?Modified Rankin (Stroke Patients Only) ?  ? ?  ? ?Balance Overall balance assessment: Needs assistance ?Sitting-balance support: No upper extremity supported, Feet supported ?Sitting balance-Leahy Scale: Good ?  ?  ?Standing balance support: Reliant on assistive device for balance, Bilateral upper extremity supported, During functional activity ?Standing balance-Leahy Scale: Poor ?  ?  ?  ?  ?  ?  ?  ?  ?  ?  ?  ?  ?   ? ? ? ?Pertinent Vitals/Pain Pain Assessment ?Pain Assessment: No/denies pain  ? ? ?Home Living Family/patient expects to be discharged to:: Private residence ?Living Arrangements: Spouse/significant other ?Available Help at Discharge: Family;Available 24 hours/day ?Type of Home: House ?Home Access: Level entry ?  ?  ?  ?  Home Layout: One level ?Home Equipment: Conservation officer, nature (2 wheels);Shower seat ?   ?  ?Prior Function Prior Level of Function : Independent/Modified Independent ?  ?   ?  ?  ?  ?  ?Mobility Comments: does not drive. Able to ambulate community distances but does tire quickly with grocery shopping. ?  ?  ? ? ?Hand Dominance  ?   ? ?  ?Extremity/Trunk Assessment  ? Upper Extremity Assessment ?Upper Extremity Assessment: Defer to OT evaluation ?  ? ?Lower Extremity Assessment ?Lower Extremity Assessment: Generalized weakness ?  ? ?Cervical / Trunk Assessment ?Cervical / Trunk Assessment: Normal  ?Communication  ? Communication: No difficulties  ?Cognition Arousal/Alertness: Awake/alert ?Behavior During Therapy: [facility] for tasks assessed/performed ?Overall Cognitive Status: Impaired/Different from baseline ?Area of Impairment: Awareness, Safety/judgement ?  ?  ?  ?  ?  ?  ?  ?  ?  ?  ?  ?  ?Safety/Judgement: Decreased awareness of safety, Decreased awareness of deficits ?Awareness: Emergent ?  ?  ?  ?  ? ?  ?General Comments General comments (skin integrity, edema, etc.): HR up to 118 with mobility. SpO2 stable on RA ? ?  ?Exercises General Exercises - Lower Extremity ?Ankle Circles/Pumps: AROM, Both, 10 reps, Seated ?Long Arc Quad: AROM, Right, Left, 5 reps, Seated ?Hip Flexion/Marching: AROM, Right, Left, 5 reps, Seated  ? ?Assessment/Plan  ?  ?PT Assessment Patient needs continued PT services  ?PT Problem List Decreased strength;Decreased mobility;Decreased safety awareness;Decreased knowledge of precautions;Decreased activity tolerance;Decreased cognition;Decreased balance;Decreased knowledge of use of DME ? ?   ?  ?PT Treatment Interventions DME instruction;Therapeutic activities;Gait training;Therapeutic exercise;Patient/family education;Balance training;Functional mobility training   ? ?PT Goals (Current goals can be found in the Care Plan section)  ?Acute Rehab PT Goals ?Patient Stated Goal: home ?PT Goal Formulation: With patient/family ?Time For Goal Achievement: 06/28/21 ?Potential to Achieve Goals: Good ? ?  ?Frequency Min 3X/week ?  ? ? ?Co-evaluation   ?  ?  ?  ?  ? ? ?   ?AM-PAC PT "6 Clicks" Mobility  ?Outcome Measure Help needed turning from your back to your side while in a flat bed without using bedrails?: A Little ?Help needed moving from lying on your back to sitting on the side of a flat bed without using bedrails?: A Lot ?Help needed moving to and from a bed to a chair (including a wheelchair)?: A Lot ?Help needed standing up from a chair using your arms (e.g., wheelchair or bedside chair)?: A Lot ?Help needed to walk in hospital room?: A Lot ?Help needed climbing 3-5 steps with a railing? : Total ?6 Click Score: 12 ? ?  ?End of Session Equipment Utilized During Treatment: Gait belt ?Activity Tolerance: Patient tolerated treatment well ?Patient left: in chair;with call bell/phone within reach;with family/visitor present ?Nurse Communication: Mobility status ?PT Visit Diagnosis: Unsteadiness on feet (R26.81);Muscle weakness (generalized) (M62.81);Difficulty in walking, not elsewhere classified (R26.2) ?  ? ?Time: 1219-7588 ?PT Time Calculation (min) (ACUTE ONLY): 27 min ? ? ?Charges:   PT Evaluation ?$PT Eval Moderate Complexity: 1 Mod ?PT Treatments ?$Gait Training: 8-22 mins ?  ?   ? ? ?Lorrin Goodell, PT  ?Office # [phone] ?Pager [phone] ? ? ?Lorriane Shire ?06/14/2021, 1:41 PM ? ?

## 2021-06-14 NOTE — Progress Notes (Addendum)
[facility] Progress Note ? ?Eric Dennis 56 y.o. 04/26/1965 ? ? ?Subjective: ?Sitting in bedside chair. Denies abdominal pain, N/V. Brown loose stool this morning. Wife in room. ? ?Objective: ?Vital signs: ?Vitals:  ? 06/14/21 0909 06/14/21 1157  ?BP: 120/60 124/68  ?Pulse:  98  ?Resp:  16  ?Temp:  97.9 ?F (36.6 ?C)  ?SpO2:  95%  ? ? ?Physical Exam: ?Gen: lethargic, chronically ill-appearing, no acute distress  ?HEENT: +icteric sclera; poor dentition ?CV: RRR ?Chest: CTA B ?Abd: mild distention, nontender, +BS ?Ext: no edema ? ?Lab Results: ?Recent Labs  ?  06/13/21 ?0816 06/14/21 ?0129  ?NA 140 136  ?K 4.9 5.4*  ?CL 105 104  ?CO2 29 29  ?GLUCOSE 165* 147*  ?BUN 55* 56*  ?CREATININE 1.19 1.18  ?CALCIUM 8.9 8.9  ?MG 2.4 2.5*  ?PHOS 3.6 4.4  ? ?Recent Labs  ?  06/13/21 ?0816 06/14/21 ?0129  ?AST 222* 200*  ?ALT 104* 111*  ?ALKPHOS 238* 198*  ?BILITOT 21.1* 20.5*  ?PROT 5.0* 5.1*  ?ALBUMIN 2.4* 2.3*  ? ?Recent Labs  ?  06/13/21 ?0816 06/14/21 ?0129  ?WBC 13.5* 11.7*  ?NEUTROABS 8.8* 8.6*  ?HGB 7.2* 7.0*  ?HCT 20.7* 21.2*  ?MCV 108.4* 107.1*  ?PLT 54* 47*  ? ? ? ? ?Assessment/Plan: ?Decompensated alcoholic cirrhosis and alcoholic hepatitis on steroids. Needs to complete a 28 day course of steroids. Hgb drop to 7 without any overt bleeding. Recheck CBC and transfuse if it keeps falling. Strongly advised to stop drinking alcohol and he says he plans to. Also needs to be on a low sodium diet. Conservative management. Rehab evaluation planned by primary team. F/U with Dr. Therisa Doyne 3-4 weeks after discharge from hosp or rehab. Will sign off. Call if questions. ? ? ?Lear Ng ?06/14/2021, 1:19 PM ? ?Questions please call [phone] Patient ID: Eric Dennis, male   DOB: Jan 02, 1966, 56 y.o.   MRN: [id number] ? ?

## 2021-06-15 DIAGNOSIS — Z515 Encounter for palliative care: Secondary | ICD-10-CM | POA: Diagnosis not present

## 2021-06-15 DIAGNOSIS — K7290 Hepatic failure, unspecified without coma: Secondary | ICD-10-CM | POA: Diagnosis not present

## 2021-06-15 DIAGNOSIS — K7460 Unspecified cirrhosis of liver: Secondary | ICD-10-CM | POA: Diagnosis not present

## 2021-06-15 LAB — GLUCOSE, CAPILLARY
Glucose-Capillary: 105 mg/dL — ABNORMAL HIGH (ref 70–99)
Glucose-Capillary: 106 mg/dL — ABNORMAL HIGH (ref 70–99)
Glucose-Capillary: 108 mg/dL — ABNORMAL HIGH (ref 70–99)
Glucose-Capillary: 134 mg/dL — ABNORMAL HIGH (ref 70–99)
Glucose-Capillary: 145 mg/dL — ABNORMAL HIGH (ref 70–99)
Glucose-Capillary: 146 mg/dL — ABNORMAL HIGH (ref 70–99)
Glucose-Capillary: 178 mg/dL — ABNORMAL HIGH (ref 70–99)

## 2021-06-15 LAB — COMPREHENSIVE METABOLIC PANEL
ALT: 122 U/L — ABNORMAL HIGH (ref 0–44)
AST: 181 U/L — ABNORMAL HIGH (ref 15–41)
Albumin: 2.3 g/dL — ABNORMAL LOW (ref 3.5–5.0)
Alkaline Phosphatase: 161 U/L — ABNORMAL HIGH (ref 38–126)
Anion gap: 6 (ref 5–15)
BUN: 51 mg/dL — ABNORMAL HIGH (ref 6–20)
CO2: 27 mmol/L (ref 22–32)
Calcium: 8.7 mg/dL — ABNORMAL LOW (ref 8.9–10.3)
Chloride: 101 mmol/L (ref 98–111)
Creatinine, Ser: 1.2 mg/dL (ref 0.61–1.24)
GFR, Estimated: >60 mL/min (ref >60)
Glucose, Bld: 118 mg/dL — ABNORMAL HIGH (ref 70–99)
Potassium: 4.7 mmol/L (ref 3.5–5.1)
Sodium: 134 mmol/L — ABNORMAL LOW (ref 135–145)
Total Bilirubin: 22.3 mg/dL (ref 0.3–1.2)
Total Protein: 5.3 g/dL — ABNORMAL LOW (ref 6.5–8.1)

## 2021-06-15 NOTE — Progress Notes (Signed)
The patient is injury-free, afebrile, alert, and oriented X 3. Vital signs were within the baseline during this shift. Pt denies chest pain, SOB, nausea, vomiting, dizziness, signs or symptoms of bleeding or infection, or acute changes during this shift. We will continue to monitor and work toward achieving the care plan goal ?

## 2021-06-15 NOTE — Progress Notes (Signed)
?PROGRESS NOTE ? ? ? ?Eric Dennis  EXB:[id number] DOB: [birth year] DOA: 06/08/2021 ?PCP: Scheryl Marten, PA  ? ? ?Brief Narrative:  ?56 year old with history of alcohol abuse complicated with alcoholic cirrhosis and prior esophageal varices status post banding presented to emergency room on 4/2 with altered mental status, poor oral intake ongoing for more than a week.  In the emergency room he was found with greatly elevated LFTs, elevated INR and pneumonia along with AKI, he was hypotensive.  Admitted to ICU due to acute decompensated liver cirrhosis and difficulty protecting the airway requiring intubation. ? ?04/02 Presented to the ED with worsening AMS and difficulty protecting the airway. Patient intubated. Transferred to ICU given acute risk for decompensation. AKI improved with IVF. ?04/03 Nephrology consulted for worsening AKI 2.20-->3.14.  ?04/04 Pt had hgb drop less to than 7 requiring 1 unit pRBC. Weaned from ventilator until 8 pm. ?04/05 Patient weaned again and tolerated it well, he was extubated later. ?04/06 Patient remained stable off intubation, transferred to medical floor with NG tube and rectal tube on. ?4/9 no overnight issues . Wife at bedside, updated about CIR ? ? ? ?Assessment & Plan: ?  ?Acute hepatic encephalopathy with underlying decompensated alcoholic cirrhosis, acute alcoholic hepatitis, metabolic alkalosis, coagulopathy from liver failure.  Pancytopenia and thrombocytopenia.  Hyperbilirubinemia and hyperammonemia. ? ?MELD score 33, poor prognosis, recent alcohol use. ?1 unit FFP given to reverse coagulopathy.  Currently no evidence of active bleeding. ?Remains on lactulose titrated for loose bowel movement, on rifaximin.  Remains on Rocephin 1 g IV, will continue 5 days of therapy for SBP prophylaxis today and stop. ?Patient is currently on Solu-Medrol for acute alcoholic hepatitis, somehow improvement of bilirubin, transaminases remain elevated.  Coagulopathy remains  stable. ?Changed to prednisone 40 mg daily for 4 weeks, continue similar doses until seen by GI as outpatient. ?Poor prognosis.  Not a transplant candidate.  Palliative discussion ongoing. ?4/9 cir pending. ?Outpt palliative care f/u recommended. ? ? ?Acute kidney injury secondary to ischemic ATN, hypernatremia: ?Improved.  ? ? ?Severe protein calorie malnutrition, failure to thrive:  ?Encourage oral intake ?Poor px ? ?Anemia of critical illness, acute on chronic anemia of GI blood loss, upper GI bleeding: ?1 unit PRBC 4/5, currently on Protonix.  No evidence of ongoing bleeding.   ? transfuse PRBC if less than 7. ?4/9 ck h/h in am.  ? ? ?DVT prophylaxis: SCDs Start: 06/08/21 0814 ? ? ?Code Status: Full code ?Family Communication: Wife at the bedside. ?Disposition Plan: Status is: Inpatient ?Remains inpatient appropriate because: Unsafe discharge planning. ? ?Cir pending ? ? ?Consultants:  ?Gastroenterology, signed off ?Nephrology, signed off ?PCCM ? ?Procedures:  ?Mechanical intubation ? ?Antimicrobials:  ?Rocephin 4/2--/8 ?Rifaximin long-term ? ? ?Subjective: ?Pt without sob, abd pain,cp, or nausea ? ?Objective: ?Vitals:  ? 06/15/21 0015 06/15/21 0448 06/15/21 0754 06/15/21 1134  ?BP: (!) 120/54  (!) 114/56 (!) 110/56  ?Pulse: 75  87 95  ?Resp: '18 18 16 16  '$ ?Temp: 98.3 ?F (36.8 ?C) 98.2 ?F (36.8 ?C) 98.8 ?F (37.1 ?C) 97.8 ?F (36.6 ?C)  ?TempSrc: Axillary Axillary Oral Oral  ?SpO2: 95%  91% 94%  ?Weight:      ?Height:      ? ? ?Intake/Output Summary (Last 24 hours) at 06/15/2021 1432 ?Last data filed at 06/15/2021 1027 ?Gross per 24 hour  ?Intake 480 ml  ?Output 1400 ml  ?Net -920 ml  ? ?Filed Weights  ? 06/12/21 0429 06/13/21 0500 06/14/21 0600  ?Weight:  78.9 kg 86.4 kg 90.3 kg  ? ? ?Examination: ?Calm, NAD, sitting up in bed ?Decrease bs ?Reg s1/s2 no gallop ?Soft benign +bs ?No edema ?Awake and alert ?Mood and affect appropriate in current setting  ? ? ? ?Data Reviewed: I have personally reviewed following labs and  imaging studies ? ?CBC: ?Recent Labs  ?Lab 06/11/21 ?0235 06/11/21 ?7672 06/12/21 ?0947 06/13/21 ?0962 06/14/21 ?0129 06/14/21 ?1425  ?WBC 11.4*  --  13.3* 13.5* 11.7* 13.1*  ?NEUTROABS 6.5  --  7.9* 8.8* 8.6* 10.3*  ?HGB 7.6* 6.8* 8.4* 7.2* 7.0* 7.6*  ?HCT 21.2* 20.0* 24.7* 20.7* 21.2* 22.7*  ?MCV 105.5*  --  109.8* 108.4* 107.1* 108.1*  ?PLT 71*  --  57* 54* 47* 51*  ? ?Basic Metabolic Panel: ?Recent Labs  ?Lab 06/09/21 ?8366 06/10/21 ?2947 06/11/21 ?0235 06/11/21 ?6546 06/12/21 ?5035 06/13/21 ?4656 06/14/21 ?0129 06/15/21 ?0216  ?NA 133* 139 146* 152* 152* 140 136 134*  ?K 3.6 3.6 4.1 4.0 4.7 4.9 5.4* 4.7  ?CL 93* 100 111  --  117* 105 104 101  ?CO2 '25 26 29  '$ --  '31 29 29 27  '$ ?GLUCOSE 183* 221* 292*  --  197* 165* 147* 118*  ?BUN 103* 113* 81*  --  63* 55* 56* 51*  ?CREATININE 3.14* 2.11* 1.41*  --  1.25* 1.19 1.18 1.20  ?CALCIUM 8.3* 8.1* 9.1  --  9.7 8.9 8.9 8.7*  ?MG 1.8 2.1  --   --   --  2.4 2.5*  --   ?PHOS  --  4.2  --   --   --  3.6 4.4  --   ? ?GFR: ?Estimated Creatinine Clearance: 83.4 mL/min (by C-G formula based on SCr of 1.2 mg/dL). ?Liver Function Tests: ?Recent Labs  ?Lab 06/11/21 ?0235 06/12/21 ?0622 06/13/21 ?8127 06/14/21 ?0129 06/15/21 ?0216  ?AST 248* 281* 222* 200* 181*  ?ALT 78* 105* 104* 111* 122*  ?ALKPHOS 97 237* 238* 198* 161*  ?BILITOT 25.4* 26.3* 21.1* 20.5* 22.3*  ?PROT 5.3* 5.6* 5.0* 5.1* 5.3*  ?ALBUMIN 2.8* 2.8* 2.4* 2.3* 2.3*  ? ?No results for input(s): LIPASE, AMYLASE in the last 168 hours. ? ?Recent Labs  ?Lab 06/13/21 ?0816  ?AMMONIA 34  ? ?Coagulation Profile: ?Recent Labs  ?Lab 06/09/21 ?5170 06/11/21 ?0235 06/13/21 ?0174  ?INR 2.7* 2.5* 2.2*  ? ?Cardiac Enzymes: ?No results for input(s): CKTOTAL, CKMB, CKMBINDEX, TROPONINI in the last 168 hours. ?BNP (last 3 results) ?No results for input(s): PROBNP in the last 8760 hours. ?HbA1C: ?No results for input(s): HGBA1C in the last 72 hours. ?CBG: ?Recent Labs  ?Lab 06/14/21 ?2005 06/15/21 ?0015 06/15/21 ?9449 06/15/21 ?6759  06/15/21 ?1133  ?GLUCAP 205* 105* 106* 134* 108*  ? ?Lipid Profile: ?No results for input(s): CHOL, HDL, LDLCALC, TRIG, CHOLHDL, LDLDIRECT in the last 72 hours. ?Thyroid Function Tests: ?No results for input(s): TSH, T4TOTAL, FREET4, T3FREE, THYROIDAB in the last 72 hours. ?Anemia Panel: ?No results for input(s): VITAMINB12, FOLATE, FERRITIN, TIBC, IRON, RETICCTPCT in the last 72 hours. ?Sepsis Labs: ?No results for input(s): PROCALCITON, LATICACIDVEN in the last 168 hours. ? ?Recent Results (from the past 240 hour(s))  ?Culture, blood (routine x 2)     Status: None  ? Collection Time: 06/08/21  3:20 AM  ? Specimen: BLOOD LEFT ARM  ?Result Value Ref Range Status  ? Specimen Description BLOOD LEFT ARM  Final  ? Special Requests   Final  ?  BOTTLES DRAWN AEROBIC AND ANAEROBIC Blood Culture results may not  be optimal due to an inadequate volume of blood received in culture bottles  ? Culture   Final  ?  NO GROWTH 5 DAYS ?Performed at [facility], [place] [address] [place], [place] [address] ?  ? Report Status 06/13/2021 FINAL  Final  ?Culture, blood (routine x 2)     Status: None  ? Collection Time: 06/08/21  3:49 AM  ? Specimen: BLOOD  ?Result Value Ref Range Status  ? Specimen Description BLOOD SITE NOT SPECIFIED  Final  ? Special Requests   Final  ?  BOTTLES DRAWN AEROBIC AND ANAEROBIC Blood Culture adequate volume  ? Culture   Final  ?  NO GROWTH 5 DAYS ?Performed at [facility], [place] [address], [place], [place] [address] ?  ? Report Status 06/13/2021 FINAL  Final  ?MRSA Next Gen by PCR, Nasal     Status: None  ? Collection Time: 06/08/21 12:41 PM  ? Specimen: Nasal Mucosa; Nasal Swab  ?Result Value Ref Range Status  ? MRSA by PCR Next Gen NOT DETECTED NOT DETECTED Final  ?  Comment: (NOTE) ?The GeneXpert MRSA Assay (FDA approved for NASAL specimens only), ?is one component of a comprehensive MRSA colonization surveillance ?program. It is not intended to diagnose MRSA infection nor to guide ?or monitor  treatment for MRSA infections. ?Test performance is not FDA approved in patients less than 2 years ?old. ?Performed at [facility], [place] [address] [place], [place] ?[address] ?  ?  ? ? ? ? ? ?Radiology Studies:

## 2021-06-15 NOTE — Progress Notes (Addendum)
? ?  Palliative Medicine Inpatient Follow Up Note ? ?HPI: ? 56 year old male with past medical hx of alcohol use c/b alcoholic cirrhosis and prior esophageal varices s/p banding, presented to [facility] ED on 06/08/21 with AMS with family reporting half a week hx of worsening alteration in mental status with concomitant low PO intake. Patient hypotensive in the ED. Lab work showed patient having elevated LFTs, INR, and ammonia, along with AKI, and electrolyte abnormalities. Imaging negative for head injury or evidence of pneumonia. Patient admitted to ICU due to acute decompensated liver cirrhosis and difficulty protecting the airway requiring intubation.  ?  ?Palliative care has been asked get involved to further address goals of care. ? ?Today's Discussion (06/15/2021): ? ?*Please note that this is a verbal dictation therefore any spelling or grammatical errors are due to the "[name]" system interpretation. ? ?Chart reviewed inclusive of vital signs, progress notes, laboratory results, and diagnostic images.  ? ?I met with Eric Dennis and his wife, Barrett Shell this morning. Eric Dennis is a bit more sleepy this morning. He is arousable and appropriately answers questions.  We reviewed that Eric Dennis is very weak and will need ongoing physical therapy in CIR. His wife shares that he "wants to go home today." I shared that if Tadarrius can't walk he cannot go home safely. She is in agreement with this. I reviewed the importance of activity with Marcello Moores.  ? ?Questions and concerns addressed  ? ?Palliative Support Provided ? ?Objective Assessment: ?Vital Signs ?Vitals:  ? 06/15/21 0448 06/15/21 0754  ?BP:  (!) 114/56  ?Pulse:  87  ?Resp: 18 16  ?Temp: 98.2 ?F (36.8 ?C) 98.8 ?F (37.1 ?C)  ?SpO2:  91%  ? ? ?Intake/Output Summary (Last 24 hours) at 06/15/2021 1006 ?Last data filed at 06/15/2021 0400 ?Gross per 24 hour  ?Intake 480 ml  ?Output 1000 ml  ?Net -520 ml  ? ? ?Last Weight  Most recent update: 06/14/2021  6:35 AM  ? ? Weight  ?90.3 kg  (199 lb 1.2 oz)  ?      ? ?  ? ?Gen:  Middle aged M in NAD ?HEENT: Sclera yellow, moist mucous membranes ?CV: Regular rate and rhythm ?PULM: On RA breathing is even and non-labored ?ABD: soft/nontender ?EXT: No edema ?Neuro: Alert and oriented x3 ? ?SUMMARY OF RECOMMENDATIONS   ?Full Code --> Recommended patient consider DNAR/DNI in the setting of his chronic cirrhosis with no cure and presently no options for transplant consideration ? ?A MOST and Advance Directives were provided to patient and spouse, I shared the value and importance of completing these. ? ?TOC - OP Palliative support on discharge ? ?Appreciate TOC team as patients wife interested in becoming patients full time caregiver ? ?Plan for CIR for muscular strengthening ? ?Patient's wife plans to speak to GI MD in terms of further prognostication of Cirrhosis severity ?  ?Ongoing  incremental Palliative Support ? ?MDM - Moderate  ?______________________________________________________________________________________ ?Tacey Ruiz ?[facility] Team ?Team Cell Phone: [phone] ?Please utilize secure chat with additional questions, if there is no response within 30 minutes please call the above phone number ? ?Palliative Medicine Team providers are available by phone from 7am to 7pm daily and can be reached through the team cell phone.  ?Should this patient require assistance outside of these hours, please call the patient's attending physician. ? ? ? ? ?

## 2021-06-15 NOTE — Plan of Care (Signed)

## 2021-06-15 NOTE — Progress Notes (Signed)
Inpatient Rehab Admissions Coordinator:  ? ? I spoke with Pt.'s wife over the phone to discuss potential CIR admit. She is interested but acknowledged Pt. Remains adamant about going home and not going anywhere for rehab. I will have to open a case with Pt.'s insurance and pursue auth. I will follow up for potential admission pending insurance auth and pt. Consenting to come  ? ?Clemens Catholic, MS, CCC-SLP ?Rehab Admissions Coordinator  ?[phone] (celll) ?[phone] (office) ? ? ?

## 2021-06-16 LAB — COMPREHENSIVE METABOLIC PANEL
ALT: 117 U/L — ABNORMAL HIGH (ref 0–44)
AST: 161 U/L — ABNORMAL HIGH (ref 15–41)
Albumin: 2.1 g/dL — ABNORMAL LOW (ref 3.5–5.0)
Alkaline Phosphatase: 129 U/L — ABNORMAL HIGH (ref 38–126)
Anion gap: 7 (ref 5–15)
BUN: 46 mg/dL — ABNORMAL HIGH (ref 6–20)
CO2: 27 mmol/L (ref 22–32)
Calcium: 8.7 mg/dL — ABNORMAL LOW (ref 8.9–10.3)
Chloride: 99 mmol/L (ref 98–111)
Creatinine, Ser: 1.2 mg/dL (ref 0.61–1.24)
GFR, Estimated: >60 mL/min (ref >60)
Glucose, Bld: 107 mg/dL — ABNORMAL HIGH (ref 70–99)
Potassium: 4.6 mmol/L (ref 3.5–5.1)
Sodium: 133 mmol/L — ABNORMAL LOW (ref 135–145)
Total Bilirubin: 21.8 mg/dL (ref 0.3–1.2)
Total Protein: 5 g/dL — ABNORMAL LOW (ref 6.5–8.1)

## 2021-06-16 LAB — GLUCOSE, CAPILLARY
Glucose-Capillary: 83 mg/dL (ref 70–99)
Glucose-Capillary: 99 mg/dL (ref 70–99)
Glucose-Capillary: 121 mg/dL — ABNORMAL HIGH (ref 70–99)
Glucose-Capillary: 270 mg/dL — ABNORMAL HIGH (ref 70–99)
Glucose-Capillary: 243 mg/dL — ABNORMAL HIGH (ref 70–99)
Glucose-Capillary: 149 mg/dL — ABNORMAL HIGH (ref 70–99)

## 2021-06-16 MED ORDER — INSULIN GLARGINE-YFGN 100 UNIT/ML ~~LOC~~ SOLN: 20.0000 [IU] | Freq: Every day | SUBCUTANEOUS | 11 refills | Status: DC

## 2021-06-16 MED ORDER — PREDNISONE 20 MG PO TABS: 40.0000 mg | ORAL_TABLET | Freq: Every day | ORAL | Status: DC

## 2021-06-16 MED ORDER — RIFAXIMIN 550 MG PO TABS: 550.0000 mg | ORAL_TABLET | Freq: Two times a day (BID) | ORAL | Status: DC

## 2021-06-16 MED ORDER — DOCUSATE SODIUM 100 MG PO CAPS: 100.0000 mg | ORAL_CAPSULE | Freq: Two times a day (BID) | ORAL | 0 refills | Status: DC | PRN

## 2021-06-16 NOTE — Plan of Care (Signed)

## 2021-06-16 NOTE — Progress Notes (Addendum)
Occupational Therapy Treatment ?Patient Details ?Name: Eric Dennis ?MRN: [id number] ?DOB: 03/07/1966 ?Today's Date: 06/16/2021 ? ? ?History of present illness Pt is a 56 y.o. male admitted 4/2 with decompensated hepatic cirrhosis. Required intubation 4/2-4/5. PMH: alcoholic cirrhosis, prior esophageal varices s/p banding ?  ?OT comments ? Pt continues with limited endurance as well as decreased strength for sit to stand and for completion of toileting tasks.  He currently needs mod assist for these tasks overall with standing endurance of one minute or less.  Will continue with acute care OT but feel pt will need to get stronger through AIR services to go home as his spouse works days.    ? ?Recommendations for follow up therapy are one component of a multi-disciplinary discharge planning process, led by the attending physician.  Recommendations may be updated based on patient status, additional functional criteria and insurance authorization. ?   ?Follow Up Recommendations ? Acute inpatient rehab (3hours/day)  ?  ?Assistance Recommended at Discharge Frequent or constant Supervision/Assistance  ?Patient can return home with the following ? A little help with walking and/or transfers;A little help with bathing/dressing/bathroom;Assistance with cooking/housework;Direct supervision/assist for medications management;Direct supervision/assist for financial management;Assist for transportation ?  ?Equipment Recommendations ? BSC/3in1  ?  ?Recommendations for Other Services Rehab consult ? ?  ?Precautions / Restrictions Precautions ?Precautions: Fall;Other (comment) ?Precaution Comments: multiple falls at home ?Restrictions ?Weight Bearing Restrictions: No  ? ? ?  ? ?Mobility Bed Mobility ?  ?  ?  ?  ?  ?  ?  ?  ?  ? ?Transfers ?Overall transfer level: Needs assistance ?Equipment used: Rolling walker (2 wheels) ?Transfers: Sit to/from Stand ?Sit to Stand: Mod assist (from lowered EOB) ?  ?  ?  ?  ?  ?  ?  ?  ?Balance  Overall balance assessment: Needs assistance ?Sitting-balance support: No upper extremity supported, Feet supported ?Sitting balance-Leahy Scale: Good ?  ?  ?Standing balance support: Reliant on assistive device for balance, Bilateral upper extremity supported, During functional activity ?Standing balance-Leahy Scale: Poor ?Standing balance comment: Pt unable to stand for more than one minute with peri cleaning. ?  ?  ?  ?  ?  ?  ?  ?  ?  ?  ?  ?   ? ?ADL either performed or assessed with clinical judgement  ? ?ADL Overall ADL's : Needs assistance/impaired ?  ?  ?  ?  ?  ?  ?  ?  ?  ?  ?  ?  ?Toilet Transfer: Moderate assistance;BSC/3in1;Rolling walker (2 wheels) ?Toilet Transfer Details (indicate cue type and reason): simulated stand pivot ?Toileting- Clothing Manipulation and Hygiene: Moderate assistance;Sit to/from stand ?Toileting - Clothing Manipulation Details (indicate cue type and reason): pt needed 3 attempts to stand to complete cleaning ?  ?  ?Functional mobility during ADLs: Moderate assistance;Rolling walker (2 wheels) ?General ADL Comments: Pt with low endurance, only able to stand for up to one minute at a time when attempting to complete functional mobility and standing to wash front and back peri area.  Mod assist for sit to stand from the EOB with min from the bedside recliner with use of the arm rests. ?  ? ? ?   ?   ?   ? ?Cognition Arousal/Alertness: Awake/alert ?Behavior During Therapy: [facility] for tasks assessed/performed ?Overall Cognitive Status: No family/caregiver present to determine baseline cognitive functioning ?Area of Impairment: Safety/judgement, Awareness ?  ?  ?  ?  ?  ?  ?  ?  ?  ?  ?  ?  ?  Safety/Judgement: Decreased awareness of safety, Decreased awareness of deficits ?Awareness: Anticipatory ?  ?General Comments: Pt able to state need for the RW to stand and try to ambulate to the sink when asked. ?  ?  ?   ?   ?   ?   ? ? ?Pertinent Vitals/ Pain       Pain Assessment ?Pain  Assessment: No/denies pain ? ?   ?   ? ?Frequency ? Min 2X/week  ? ? ? ? ?  ?Progress Toward Goals ? ?OT Goals(current goals can now be found in the care plan section) ? Progress towards OT goals: Progressing toward goals ? ?Acute Rehab OT Goals ?Patient Stated Goal: Pt did not state but agreeable to participation in therapy. ?Time For Goal Achievement: 06/28/21 ?Potential to Achieve Goals: Good  ?Plan Discharge plan remains appropriate   ? ?   ?AM-PAC OT "6 Clicks" Daily Activity     ?Outcome Measure ? ? Help from another person eating meals?: None ?Help from another person taking care of personal grooming?: A Little ?Help from another person toileting, which includes using toliet, bedpan, or urinal?: A Lot ?Help from another person bathing (including washing, rinsing, drying)?: A Lot ?Help from another person to put on and taking off regular upper body clothing?: None ?Help from another person to put on and taking off regular lower body clothing?: A Lot ?6 Click Score: 17 ? ?  ?End of Session Equipment Utilized During Treatment: Rolling walker (2 wheels) ? ?OT Visit Diagnosis: Unsteadiness on feet (R26.81);Other abnormalities of gait and mobility (R26.89);Repeated falls (R29.6);Muscle weakness (generalized) (M62.81);History of falling (Z91.81);Other symptoms and signs involving cognitive function ?  ?Activity Tolerance Patient limited by fatigue ?  ?Patient Left with call bell/phone within reach;in chair;with chair alarm set ?  ?Nurse Communication Mobility status ?  ? ?   ? ?Time: 1200-1240 ?OT Time Calculation (min): 40 min ? ?Charges: OT General Charges ?$OT Visit: 1 Visit ?OT Treatments ?$Self Care/Home Management : 38-52 mins ? ? ? ?Teriana Danker OTR/L ?06/16/2021, 1:17 PM ?

## 2021-06-16 NOTE — PMR Pre-admission (Signed)
PMR Admission Coordinator Pre-Admission Assessment ? ?Patient: Eric Dennis is an 56 y.o., male ?MRN: [id number] ?DOB: 05/17/1965 ?Height: _0  (193 cm) ?Weight: 91.4 kg ? ?Insurance Information ?HMO:     PPO:      PCP:      IPA:      80/20:      OTHER:  ?PRIMARY: [employer] Medicaid      Policy#: [id number]      Subscriber: Pt ?CM Name: Annamary Carolin       Phone#: [phone]     Fax#: [phone] I received a call from [facility] at [facility] on 4/10 granting approval for 7 days from date of admission with updates due on the 7th day.  ?Pre-Cert#: [id number]      Employer:  ?Eff Date: 12/07/2020 - 09/05/2021 ?Deductible: $0 (does not have deductible) ?OOP Max: $0 ?CIR: 100% coverage ?SNF: 100% coverage, first 90 days covered by [facility] Medicaid then members move to [facility] ?Outpatient: 100% coverage 27 visits per calendar year across all therapy disciplines combined ?Home Health:  100% coverage Home Health Aides services must be limited to 100 total visits per year per member ?DME: 100% coverage, limited by medical necessity ?Providers: In network   ? ?SECONDARY:       Policy#:      Phone#:  ? ?Financial Counselor:       Phone#:  ? ?The ?Data Collection Information Summary? for patients in Inpatient Rehabilitation Facilities with attached ?Privacy Act [place] Records? was provided and verbally reviewed with: Patient ? ?Emergency Contact Information ?Contact Information   ? ? Name Relation Home Work Mobile  ? Pote,Saundria Spouse [phone]  [phone]  ? Melfi,Barbara Mother [phone]    ? ?  ? ? ?Current Medical History  ?Patient Admitting Diagnosis: Hepatic encephalopathy ?History of Present Illness: Pt is a 56 yo PMH EtOH abuse, alcoholic cirrhosis,  prior esophageal varices s/p banding, who presented to [facility] ED 4/2 with AMS. Admitted for Hepatic Encephalopathy. Patient was found to have acute kidney injury and significantly elevated LFTs.  Was started on IV steroids later transitioned to p.o.  Also received IV  Rocephin for empiric treatment for SBP eventually stopped. Slowly renal function improved and his LFTs stabilized.  He was seen by gastroenterology who cleared the patient for discharge. PT. And family are aware prognosis is relatively poor and he is not a transplant candidate. PT/OT consulted and recommended CIR to assist return to PLOF. ? ? ?  ? ?Patient's medical record from [facility]  has been reviewed by the rehabilitation admission coordinator and physician. ? ?Past Medical History  ?Past Medical History:  ?Diagnosis Date  ? Elevated liver function tests   ? High cholesterol   ? Weight loss   ? ? ?Has the patient had major surgery during 100 days prior to admission? No ? ?Family History   ?family history includes Diabetes in his mother; Heart disease in his maternal grandmother. ? ?Current Medications ? ?Current Facility-Administered Medications:  ?  chlordiazePOXIDE (LIBRIUM) capsule 5 mg, 5 mg, Oral, TID PRN, Freddi Starr, MD ?  chlorhexidine gluconate (MEDLINE KIT) (PERIDEX) 0.12 % solution 15 mL, 15 mL, Mouth Rinse, BID, Margaretha Seeds, MD, 15 mL at 06/16/21 1006 ?  docusate sodium (COLACE) capsule 100 mg, 100 mg, Oral, BID PRN, Bowser, Laurel Dimmer, NP ?  folic acid (FOLVITE) tablet 1 mg, 1 mg, Oral, Daily, Ghimire, Kuber, MD, 1 mg at 06/16/21 1005 ?  insulin aspart (novoLOG) injection 0-15 Units,  0-15 Units, Subcutaneous, Q4H, Jacky Kindle, MD, 1 Units at 06/16/21 1415 ?  insulin glargine-yfgn (SEMGLEE) injection 20 Units, 20 Units, Subcutaneous, Daily, Barb Merino, MD, 20 Units at 06/16/21 1005 ?  ipratropium-albuterol (DUONEB) 0.5-2.5 (3) MG/3ML nebulizer solution 3 mL, 3 mL, Nebulization, Q6H PRN, Jacky Kindle, MD ?  lactulose (CHRONULAC) 10 GM/15ML solution 20 g, 20 g, Oral, BID, Ghimire, Kuber, MD, 20 g at 06/16/21 1005 ?  multivitamin with minerals tablet 1 tablet, 1 tablet, Oral, Daily, Barb Merino, MD, 1 tablet at 06/16/21 1005 ?  pantoprazole (PROTONIX) EC tablet  40 mg, 40 mg, Oral, Daily, Ghimire, Kuber, MD, 40 mg at 06/16/21 1005 ?  polyethylene glycol (MIRALAX / GLYCOLAX) packet 17 g, 17 g, Oral, Daily PRN, Bowser, Laurel Dimmer, NP ?  predniSONE (DELTASONE) tablet 40 mg, 40 mg, Oral, Q breakfast, Ghimire, Kuber, MD, 40 mg at 06/16/21 1005 ?  rifaximin (XIFAXAN) tablet 550 mg, 550 mg, Oral, BID, Barb Merino, MD, 550 mg at 06/16/21 1005 ?  thiamine tablet 100 mg, 100 mg, Oral, Daily, Ghimire, Kuber, MD, 100 mg at 06/16/21 1005 ? ?Patients Current Diet:  ?Diet Order   ? ?       ?  Diet regular Room service appropriate? Yes with Assist; Fluid consistency: Thin  Diet effective now       ?  ? ?  ?  ? ?  ? ? ?Precautions / Restrictions ?Precautions ?Precautions: Fall, Other (comment) ?Precaution Comments: multiple falls at home ?Restrictions ?Weight Bearing Restrictions: No  ? ?Has the patient had 2 or more falls or a fall with injury in the past year? No ? ?Prior Activity Level ?Community (5-7x/wk): Pt. was active in the community PTA ? ?Prior Functional Level ?Self Care: Did the patient need help bathing, dressing, using the toilet or eating? Independent ? ?Indoor Mobility: Did the patient need assistance with walking from room to room (with or without device)? Independent ? ?Stairs: Did the patient need assistance with internal or external stairs (with or without device)? Independent ? ?Functional Cognition: Did the patient need help planning regular tasks such as shopping or remembering to take medications? Independent ? ?Patient Information ?Are you of Hispanic, Latino/a,or Spanish origin?: A. No, not of Hispanic, Latino/a, or Spanish origin ?What is your race?: B. Black or African American ?Do you need or want an interpreter to communicate with a doctor or health care staff?: 0. No ? ?Patient's Response To:  ?Health Literacy and Transportation ?Is the patient able to respond to health literacy and transportation needs?: Yes ?Health Literacy - How often do you need to have  someone help you when you read instructions, pamphlets, or other written material from your doctor or pharmacy?: Never ?In the past 12 months, has lack of transportation kept you from medical appointments or from getting medications?: Yes ?In the past 12 months, has lack of transportation kept you from meetings, work, or from getting things needed for daily living?: Yes ? ?Home Assistive Devices / Equipment ?Home Assistive Devices/Equipment: None ?Home Equipment: Conservation officer, nature (2 wheels), Shower seat ? ?Prior Device Use: Indicate devices/aids used by the patient prior to current illness, exacerbation or injury? None of the above ? ?Current Functional Level ?Cognition ? Overall Cognitive Status: No family/caregiver present to determine baseline cognitive functioning ?Orientation Level: Oriented X4 ?Safety/Judgement: Decreased awareness of safety, Decreased awareness of deficits ?General Comments: Pt able to state need for the RW to stand and try to ambulate to the sink when asked. ?   ?  Extremity Assessment ?(includes Sensation/Coordination) ? Upper Extremity Assessment: Generalized weakness  ?Lower Extremity Assessment: Generalized weakness  ?  ?ADLs ? Overall ADL's : Needs assistance/impaired ?Eating/Feeding: Set up, Sitting ?Grooming: Set up, Sitting ?Upper Body Bathing: Minimal assistance, Sitting ?Lower Body Bathing: Moderate assistance, Sit to/from stand ?Upper Body Dressing : Set up, Sitting ?Lower Body Dressing: Moderate assistance, Sit to/from stand ?Toilet Transfer: Moderate assistance, BSC/3in1, Rolling walker (2 wheels) ?Toilet Transfer Details (indicate cue type and reason): simulated stand pivot ?Toileting- Clothing Manipulation and Hygiene: Moderate assistance, Sit to/from stand ?Toileting - Clothing Manipulation Details (indicate cue type and reason): pt needed 3 attempts to stand to complete cleaning ?Functional mobility during ADLs: Moderate assistance, Rolling walker (2 wheels) ?General ADL  Comments: Pt with low endurance, only able to stand for up to one minute at a time when attempting to complete functional mobility and standing to wash front and back peri area.  Mod assist for sit to stand fr

## 2021-06-16 NOTE — Progress Notes (Signed)
PPhysical Therapy Treatment ?Patient Details ?Name: Eric Dennis ?MRN: [id number] ?DOB: 1966/02/16 ?Today's Date: 06/16/2021 ? ? ?History of Present Illness Pt is a 56 y.o. male admitted 4/2 with decompensated hepatic cirrhosis. Required intubation 4/2-4/5. PMH: alcoholic cirrhosis, prior esophageal varices s/p banding ? ?  ?PT Comments  ? ? Patient more alert and less confused this date. Able to follow simple commands, but needs reminders for safe use of RW during transfers and gait. Remains globally weak and can benefit from continued therapies, especially education in use of DME.  ?   ?Recommendations for follow up therapy are one component of a multi-disciplinary discharge planning process, led by the attending physician.  Recommendations may be updated based on patient status, additional functional criteria and insurance authorization. ? ?Follow Up Recommendations ? Acute inpatient rehab (3hours/day) ?  ?  ?Assistance Recommended at Discharge Frequent or constant Supervision/Assistance  ?Patient can return home with the following Assistance with cooking/housework;Assist for transportation;A little help with walking and/or transfers;Help with stairs or ramp for entrance ?  ?Equipment Recommendations ? None recommended by PT  ?  ?Recommendations for Other Services   ? ? ?  ?Precautions / Restrictions Precautions ?Precautions: Fall;Other (comment) ?Precaution Comments: multiple falls at home ?Restrictions ?Weight Bearing Restrictions: No  ?  ? ?Mobility ? Bed Mobility ?Overal bed mobility: Needs Assistance ?Bed Mobility: Supine to Sit, Sit to Supine ?  ?  ?Supine to sit: Supervision ?  ?  ?General bed mobility comments: no cues needed ?  ? ?Transfers ?Overall transfer level: Needs assistance ?Equipment used: Rolling walker (2 wheels) ?Transfers: Sit to/from Stand ?Sit to Stand: Min guard ?  ?  ?  ?  ?  ?General transfer comment: cues for hand placement and sequencing, repeated x 6 reps ?   ? ?Ambulation/Gait ?Ambulation/Gait assistance: Min assist ?Gait Distance (Feet): 50 Feet ?Assistive device: Rolling walker (2 wheels) ?Gait Pattern/deviations: Step-through pattern, Decreased stride length ?Gait velocity: decreased ?  ?  ?General Gait Details: slightly unsteady gait; cues for sequencing, and proper use of RW ? ? ?Stairs ?  ?  ?  ?  ?  ? ? ?Wheelchair Mobility ?  ? ?Modified Rankin (Stroke Patients Only) ?  ? ? ?  ?Balance Overall balance assessment: Needs assistance ?Sitting-balance support: No upper extremity supported, Feet supported ?Sitting balance-Leahy Scale: Good ?  ?  ?Standing balance support: Reliant on assistive device for balance, Bilateral upper extremity supported, During functional activity ?Standing balance-Leahy Scale: Poor ?  ?  ?  ?  ?  ?  ?  ?  ?  ?  ?  ?  ?  ? ?  ?Cognition Arousal/Alertness: Awake/alert ?Behavior During Therapy: [facility] for tasks assessed/performed ?Overall Cognitive Status: No family/caregiver present to determine baseline cognitive functioning ?  ?  ?  ?  ?  ?  ?  ?  ?  ?  ?  ?  ?  ?  ?Awareness: Anticipatory ?  ?General Comments: Patient with improved awareness and not speaking of going home yet ?  ?  ? ?  ?Exercises Other Exercises ?Other Exercises: sit to stand 5 reps consecutively with cues for sequencing with RW ? ?  ?General Comments   ?  ?  ? ?Pertinent Vitals/Pain Pain Assessment ?Pain Assessment: No/denies pain  ? ? ?Home Living   ?  ?  ?  ?  ?  ?  ?  ?  ?  ?   ?  ?Prior Function    ?  ?  ?   ? ?  PT Goals (current goals can now be found in the care plan section) Acute Rehab PT Goals ?Patient Stated Goal: home ?PT Goal Formulation: With patient/family ?Time For Goal Achievement: 06/28/21 ?Potential to Achieve Goals: Good ?Progress towards PT goals: Progressing toward goals ? ?  ?Frequency ? ? ? Min 3X/week ? ? ? ?  ?PT Plan Current plan remains appropriate  ? ? ?Co-evaluation   ?  ?  ?  ?  ? ?  ?AM-PAC PT "6 Clicks" Mobility   ?Outcome Measure ? Help  needed turning from your back to your side while in a flat bed without using bedrails?: A Little ?Help needed moving from lying on your back to sitting on the side of a flat bed without using bedrails?: A Little ?Help needed moving to and from a bed to a chair (including a wheelchair)?: A Little ?Help needed standing up from a chair using your arms (e.g., wheelchair or bedside chair)?: A Little ?Help needed to walk in hospital room?: A Little ?Help needed climbing 3-5 steps with a railing? : Total ?6 Click Score: 16 ? ?  ?End of Session Equipment Utilized During Treatment: Gait belt ?Activity Tolerance: Patient tolerated treatment well ?Patient left: in chair;with call bell/phone within reach;with chair alarm set ?Nurse Communication: Mobility status ?PT Visit Diagnosis: Unsteadiness on feet (R26.81);Muscle weakness (generalized) (M62.81);Difficulty in walking, not elsewhere classified (R26.2) ?  ? ? ?Time: 4098-1191 ?PT Time Calculation (min) (ACUTE ONLY): 21 min ? ?Charges:  $Gait Training: 8-22 mins          ?          ? ? ?Arby Barrette, PT ?Acute Rehabilitation Services  ?Pager [phone] ?Office [phone] ? ? ? ?[name] ?06/16/2021, 12:14 PM ? ?

## 2021-06-16 NOTE — Discharge Summary (Addendum)
Physician Discharge Summary  ?Eric Dennis TIR:[id number] DOB: [birth year] DOA: 06/08/2021 ? ?PCP: Scheryl Marten, PA ? ?Admit date: 06/08/2021 ?Discharge date: 06/17/2021 ? ?Admitted From: Home ?Disposition:  CIR ? ?Recommendations for Outpatient Follow-up:  ?Follow up with PCP in 1-2 weeks ?Please obtain BMP/CBC in one week your next doctors visit.  ?Prednisone PO daily, Last day 07/12/21 to complete one month course ?Follow up with Dr Therisa Doyne from GI in next 2-4 weeks.  ?Continue PO lactulose, titrate to 3-4 Bowel movements per day.  ?Continue to adjust insulin as needed.  ?Continue outpatient follow-up with palliative care services. ? ? ?Discharge Condition: Stable ?CODE STATUS: Full ?Diet recommendation: Low Na and diabetic.  ? ?Brief/Interim Summary: ?56 year old with history of decompensated alcoholic cirrhosis with prior esophageal varices requiring banding came to the ED with complaints of change in mental status and oral and poor oral intake.  Patient was found to have acute kidney injury and significantly elevated LFTs.  Was started on IV steroids later transitioned to p.o.  Also received IV Rocephin for empiric treatment for SBP eventually stopped. ?Slowly renal function improved and his LFTs stabilized.  He was seen by gastroenterology who cleared the patient for discharge.  Overall patient and family aware that he has a poor prognosis and not a transplant candidate.  He was seen by palliative care services as well.  PT/OT recommended CIR therefore arrangements made. ? ? ?Assessment and Plan: ? ?Acute decompensated alcoholic liver cirrhosis ?Transaminitis ?- Patient is not a candidate for transplant overall has poor prognosis.  Seen by palliative care services and recommend outpatient follow-up with them.  Initially also received IV Rocephin empirically for SBP.  Seen by gastroenterology, recs: One month prednisone and follow up ouptn. ? ?  ?  ?Acute kidney injury secondary to ischemic ATN,  hypernatremia: ?Improved.  ?  ? ?Severe protein calorie malnutrition, failure to thrive:  ?Encourage oral intake ?Poor px ?  ?Anemia of critical illness, acute on chronic anemia of GI blood loss, upper GI bleeding: ?1 unit PRBC 4/5, currently on Protonix.  Hb stable.  ?  ? ? ?Pressure Injury 06/08/21 Elbow Left;Posterior DTI around elbow (Active)  ?06/08/21 1130  ?Location: Elbow  ?Location Orientation: Left;Posterior  ?Staging:   ?Wound Description (Comments): DTI around elbow  ?Present on Admission: Yes  ? ? ? ? ?Discharge Diagnoses:  ?Principal Problem: ?  Decompensated hepatic cirrhosis ([place]) ?Active Problems: ?  Malnutrition of moderate degree ? ? ? ? ? ?Consultations: ?GI ?Palliative care ? ?Subjective: ?No complaints, feeling ok.  ?Wife is at the bedside.  ? ?Discharge Exam: ?Vitals:  ? 06/17/21 0314 06/17/21 0855  ?BP: (!) 113/53 (!) 112/52  ?Pulse: 85 88  ?Resp: 20 18  ?Temp: 98 ?F (36.7 ?C) 98.1 ?F (36.7 ?C)  ?SpO2: 98%   ? ?Vitals:  ? 06/16/21 2302 06/17/21 0126 06/17/21 0314 06/17/21 0855  ?BP: (!) 124/51  (!) 113/53 (!) 112/52  ?Pulse: 90  85 88  ?Resp: '17  20 18  '$ ?Temp: 98.1 ?F (36.7 ?C)  98 ?F (36.7 ?C) 98.1 ?F (36.7 ?C)  ?TempSrc: Oral  Oral Oral  ?SpO2:   98%   ?Weight:  96 kg    ?Height:      ? ? ?General: Pt is alert, awake, not in acute distress ?Cardiovascular: RRR, S1/S2 +, no rubs, no gallops ?Respiratory: CTA bilaterally, no wheezing, no rhonchi ?Abdominal: Soft, NT, ND, bowel sounds + ?Extremities: no edema, no cyanosis ?+scleral icterus ? ?Discharge Instructions ? ? ?  Allergies as of 06/17/2021   ?No Known Allergies ?  ? ?  ?Medication List  ?  ? ?TAKE these medications   ? ?calcium carbonate 500 MG chewable tablet ?Commonly known as: TUMS - dosed in mg elemental calcium ?Chew 2 tablets by mouth 2 (two) times daily as needed for indigestion or heartburn. ?  ?docusate sodium 100 MG capsule ?Commonly known as: COLACE ?Take 1 capsule (100 mg total) by mouth 2 (two) times daily as needed for mild  constipation. ?  ?folic acid 1 MG tablet ?Commonly known as: FOLVITE ?Take 1 tablet (1 mg total) by mouth daily. ?What changed: when to take this ?  ?furosemide 40 MG tablet ?Commonly known as: LASIX ?Take 40 mg by mouth every evening. ?  ?ibuprofen 200 MG tablet ?Commonly known as: ADVIL ?Take 400 mg by mouth every 6 (six) hours as needed for headache or moderate pain. ?  ?insulin glargine-yfgn 100 UNIT/ML injection ?Commonly known as: SEMGLEE ?Inject 0.2 mLs (20 Units total) into the skin daily. ?  ?lactulose 10 GM/15ML solution ?Commonly known as: [place] ?Take 15 mLs (10 g total) by mouth 2 (two) times daily. ?  ?ondansetron 8 MG tablet ?Commonly known as: Zofran ?Take 1 tablet (8 mg total) by mouth every 8 (eight) hours as needed for nausea or vomiting. ?What changed:  ?when to take this ?additional instructions ?  ?pantoprazole 40 MG tablet ?Commonly known as: Protonix ?Take 1 tablet (40 mg total) by mouth daily. ?What changed: when to take this ?  ?Potassium 99 MG Tabs ?Take 99 mg by mouth every evening. ?  ?predniSONE 20 MG tablet ?Commonly known as: DELTASONE ?Take 2 tablets (40 mg total) by mouth daily with breakfast for 25 days. ?  ?rifaximin 550 MG Tabs tablet ?Commonly known as: XIFAXAN ?Take 1 tablet (550 mg total) by mouth 2 (two) times daily. ?  ?spironolactone 100 MG tablet ?Commonly known as: ALDACTONE ?Take 100 mg by mouth every evening. ?  ?thiamine 100 MG tablet ?Take 1 tablet (100 mg total) by mouth daily. ?What changed: when to take this ?  ?vitamin B-12 500 MCG tablet ?Commonly known as: CYANOCOBALAMIN ?Take 500 mcg by mouth every evening. ?  ?Vitamin D3 125 MCG (5000 UT) Tabs ?Take 5,000 Units by mouth every evening. ?  ? ?  ? ? Follow-up Information   ? ? [place], [place], [place] Follow up in 1 week(s).   ?Specialty: Internal Medicine ?Contact information: ?[address] ?[address] ?[place] [address] ?[phone] ? ? ?  ?  ? ?  ?  ? ?  ? ?No Known Allergies ? ?You were cared for by  a hospitalist during your hospital stay. If you have any questions about your discharge medications or the care you received while you were in the hospital after you are discharged, you can call the unit and asked to speak with the hospitalist on call if the hospitalist that took care of you is not available. Once you are discharged, your primary care physician will handle any further medical issues. Please note that no refills for any discharge medications will be authorized once you are discharged, as it is imperative that you return to your primary care physician (or establish a relationship with a primary care physician if you do not have one) for your aftercare needs so that they can reassess your need for medications and monitor your lab values. ? ? ?Procedures/Studies: ?DG Abd 1 View ? ?Result Date: 06/09/2021 ?CLINICAL DATA:  Orogastric tube placement. EXAM: ABDOMEN - 1 VIEW COMPARISON:  June 08, 2021. FINDINGS: Distal tip of nasogastric tube is seen in expected position of proximal stomach. IMPRESSION: Distal tip of nasogastric tube seen in expected position of proximal stomach. Electronically Signed   By: Marijo Conception M.D.   On: 06/09/2021 16:24  ? ?DG Abdomen 1 View ? ?Result Date: 06/08/2021 ?CLINICAL DATA:  Central line placement. EXAM: ABDOMEN - 1 VIEW COMPARISON:  None. FINDINGS: The bowel gas pattern is normal. No renal calculi. The distal tip of a right central femoral central line terminates at the level of L4. IMPRESSION: The distal tip of a right femoral central line terminates at the level of L4. Electronically Signed   By: Brett Fairy M.D.   On: 06/08/2021 04:37  ? ?CT Head Wo Contrast ? ?Result Date: 06/08/2021 ?CLINICAL DATA:  Mental status change, unknown cause. EXAM: CT HEAD WITHOUT CONTRAST TECHNIQUE: Contiguous axial images were obtained from the base of the skull through the vertex without intravenous contrast. RADIATION DOSE REDUCTION: This exam was performed according to the  departmental dose-optimization program which includes automated exposure control, adjustment of the mA and/or kV according to patient size and/or use of iterative reconstruction technique. COMPARISON:  None. FINDINGS: Brain: N

## 2021-06-16 NOTE — Progress Notes (Signed)
TOC continuing to follow for discharge needs.  ? ?Cedric Fishman ?LCSW, MSW, MHA ? ?

## 2021-06-16 NOTE — Progress Notes (Signed)
Inpatient Rehab Admissions Coordinator:   ? ?I do not have a bed for this Pt. On CIR today. I continue to await insurance auth. I submitted his case earlier this AM.  I will follow for potential admit pending insurance auth. ? ?Clemens Catholic, MS, CCC-SLP ?Rehab Admissions Coordinator  ?[phone] (celll) ?[phone] (office) ? ?

## 2021-06-16 NOTE — Progress Notes (Signed)
Speech Language Pathology Treatment: Dysphagia  ?Patient Details ?Name: Eric Dennis ?MRN: [id number] ?DOB: 04/01/1965 ?Today's Date: 06/16/2021 ?Time: 1240-1250 ?SLP Time Calculation (min) (ACUTE ONLY): 10 min ? ?Assessment / Plan / Recommendation ?Clinical Impression ? Pt was seen for dysphagia treatment and he was cooperative throughout the session. Pt denied any difficulty with the advanced diet and stated that he has been tolerating meds whole well. These reports were corroborated by his RN, Jesse, who added that pt attempted to take all of his meds whole with liquids and required boluses of liquid to swallow of them, but no signs of aspiration were noted. Pt tolerated regular texture solids, mixed consistency boluses (i.e., chopped pears in liquid) and thin liquids via straw using individual and consecutive swallows without symptoms of oropharyngeal dysphagia. It is recommended that the current (regular & thin) diet be continued. Further skilled SLP services are not clinically indicated at this time for swallowing.  ?  ?HPI HPI: Pt is a 56 y/o male who presented to the ED 4/2 with AMS and family reports of half a week hx of worsening AMS with concomitant low PO intake. Pt intubated due to difficulty with airway protection and incisor noted adjacent to the ETT and vocal folds during larynx visualization' tooth removed. ETT 4/2-4/5. Pt found to have AKI and acute hepatic encephalopathy. GI consulted due to dark stools in rectal tube' plan to defer EGD unless signso f active bleeding develop. PMH EtOH abuse, alcoholic cirrhosis,  prior esophageal varices s/p banding. MBS 08/28/20: mild oropharyngeal and cervical esophageal dysphagia. Minimal decreased coordination of swallow and swallow triggering at pyriform sinus allowing minimal laryngeal penetration of thin x2 that did not clear larynx. A dysphagia 2 diet with thin liquids recommended and pt was then advanced to regular texture solids and thin liquids on  08/30/20. ?  ?   ?SLP Plan ? All goals met;Discharge SLP treatment due to (comment) ? ?  ?  ?Recommendations for follow up therapy are one component of a multi-disciplinary discharge planning process, led by the attending physician.  Recommendations may be updated based on patient status, additional functional criteria and insurance authorization. ?  ? ?Recommendations  ?Diet recommendations: Regular;Thin liquid ?Liquids provided via: Cup;Straw ?Medication Administration: Whole meds with puree ?Supervision: Staff to assist with self feeding ?Compensations: Slow rate;Small sips/bites ?Postural Changes and/or Swallow Maneuvers: Seated upright 90 degrees  ?   ?    ?   ? ? ? ? Oral Care Recommendations: Oral care BID ?Follow Up Recommendations: No SLP follow up ?SLP Visit Diagnosis: Dysphagia, unspecified (R13.10) ?Plan: All goals met;Discharge SLP treatment due to (comment) ? ? ? ? ?  ?  ? ? ?Shanika I Phillips ? ?06/16/2021, 12:47 PM ? ? ? ? ?

## 2021-06-17 ENCOUNTER — Other Ambulatory Visit (HOSPITAL_COMMUNITY): Payer: Self-pay

## 2021-06-17 ENCOUNTER — Inpatient Hospital Stay (HOSPITAL_COMMUNITY)
Admission: RE | Admit: 2021-06-17 | Discharge: 2021-06-23 | DRG: 945 | Disposition: A | Discharge status: Home / Self Care | Payer: Medicaid Other | Source: Intra-hospital | Attending: Physical Medicine and Rehabilitation | Admitting: Physical Medicine and Rehabilitation

## 2021-06-17 ENCOUNTER — Encounter (HOSPITAL_COMMUNITY): Payer: Self-pay | Admitting: Physical Medicine and Rehabilitation

## 2021-06-17 ENCOUNTER — Other Ambulatory Visit: Payer: Self-pay

## 2021-06-17 DIAGNOSIS — Z8249 Family history of ischemic heart disease and other diseases of the circulatory system: Secondary | ICD-10-CM

## 2021-06-17 DIAGNOSIS — D638 Anemia in other chronic diseases classified elsewhere: Secondary | ICD-10-CM | POA: Diagnosis present

## 2021-06-17 DIAGNOSIS — E7800 Pure hypercholesterolemia, unspecified: Secondary | ICD-10-CM | POA: Diagnosis present

## 2021-06-17 DIAGNOSIS — Z79899 Other long term (current) drug therapy: Secondary | ICD-10-CM | POA: Diagnosis not present

## 2021-06-17 DIAGNOSIS — E871 Hypo-osmolality and hyponatremia: Secondary | ICD-10-CM | POA: Diagnosis present

## 2021-06-17 DIAGNOSIS — K7030 Alcoholic cirrhosis of liver without ascites: Secondary | ICD-10-CM | POA: Diagnosis present

## 2021-06-17 DIAGNOSIS — D649 Anemia, unspecified: Secondary | ICD-10-CM | POA: Diagnosis present

## 2021-06-17 DIAGNOSIS — K7460 Unspecified cirrhosis of liver: Secondary | ICD-10-CM | POA: Diagnosis present

## 2021-06-17 DIAGNOSIS — F17210 Nicotine dependence, cigarettes, uncomplicated: Secondary | ICD-10-CM | POA: Diagnosis present

## 2021-06-17 DIAGNOSIS — K767 Hepatorenal syndrome: Secondary | ICD-10-CM | POA: Diagnosis present

## 2021-06-17 DIAGNOSIS — K7290 Hepatic failure, unspecified without coma: Secondary | ICD-10-CM | POA: Diagnosis present

## 2021-06-17 DIAGNOSIS — E612 Magnesium deficiency: Secondary | ICD-10-CM | POA: Diagnosis present

## 2021-06-17 DIAGNOSIS — K7682 Hepatic encephalopathy: Secondary | ICD-10-CM | POA: Diagnosis present

## 2021-06-17 DIAGNOSIS — G4700 Insomnia, unspecified: Secondary | ICD-10-CM | POA: Diagnosis present

## 2021-06-17 DIAGNOSIS — I8510 Secondary esophageal varices without bleeding: Secondary | ICD-10-CM | POA: Diagnosis present

## 2021-06-17 DIAGNOSIS — E778 Other disorders of glycoprotein metabolism: Secondary | ICD-10-CM | POA: Diagnosis present

## 2021-06-17 DIAGNOSIS — D696 Thrombocytopenia, unspecified: Secondary | ICD-10-CM | POA: Diagnosis present

## 2021-06-17 DIAGNOSIS — K7010 Alcoholic hepatitis without ascites: Secondary | ICD-10-CM | POA: Diagnosis present

## 2021-06-17 DIAGNOSIS — K8020 Calculus of gallbladder without cholecystitis without obstruction: Secondary | ICD-10-CM | POA: Diagnosis present

## 2021-06-17 DIAGNOSIS — D72829 Elevated white blood cell count, unspecified: Secondary | ICD-10-CM | POA: Diagnosis present

## 2021-06-17 DIAGNOSIS — K7040 Alcoholic hepatic failure without coma: Secondary | ICD-10-CM | POA: Diagnosis present

## 2021-06-17 DIAGNOSIS — N179 Acute kidney failure, unspecified: Secondary | ICD-10-CM | POA: Diagnosis present

## 2021-06-17 DIAGNOSIS — L89029 Pressure ulcer of left elbow, unspecified stage: Secondary | ICD-10-CM | POA: Diagnosis present

## 2021-06-17 DIAGNOSIS — F1010 Alcohol abuse, uncomplicated: Secondary | ICD-10-CM | POA: Diagnosis present

## 2021-06-17 DIAGNOSIS — Z833 Family history of diabetes mellitus: Secondary | ICD-10-CM | POA: Diagnosis not present

## 2021-06-17 DIAGNOSIS — R5381 Other malaise: Secondary | ICD-10-CM | POA: Diagnosis present

## 2021-06-17 DIAGNOSIS — R739 Hyperglycemia, unspecified: Secondary | ICD-10-CM | POA: Diagnosis present

## 2021-06-17 DIAGNOSIS — T380X5A Adverse effect of glucocorticoids and synthetic analogues, initial encounter: Secondary | ICD-10-CM | POA: Diagnosis present

## 2021-06-17 DIAGNOSIS — T380X5D Adverse effect of glucocorticoids and synthetic analogues, subsequent encounter: Secondary | ICD-10-CM

## 2021-06-17 LAB — GLUCOSE, CAPILLARY
Glucose-Capillary: 111 mg/dL — ABNORMAL HIGH (ref 70–99)
Glucose-Capillary: 100 mg/dL — ABNORMAL HIGH (ref 70–99)
Glucose-Capillary: 116 mg/dL — ABNORMAL HIGH (ref 70–99)
Glucose-Capillary: 112 mg/dL — ABNORMAL HIGH (ref 70–99)
Glucose-Capillary: 192 mg/dL — ABNORMAL HIGH (ref 70–99)

## 2021-06-17 MED ORDER — POLYETHYLENE GLYCOL 3350 17 G PO PACK
17.0000 g | PACK | Freq: Every day | ORAL | Status: DC | PRN
Start: 2021-06-17 — End: 2021-06-23

## 2021-06-17 MED ORDER — INSULIN ASPART 100 UNIT/ML IJ SOLN
0.0000 [IU] | Freq: Three times a day (TID) | INTRAMUSCULAR | Status: DC
  Administered 2021-06-18: 1 [IU] via SUBCUTANEOUS
  Administered 2021-06-18: 2 [IU] via SUBCUTANEOUS
  Administered 2021-06-19 – 2021-06-20 (×2): 1 [IU] via SUBCUTANEOUS

## 2021-06-17 MED ORDER — LACTULOSE 10 GM/15ML PO SOLN
20.0000 g | Freq: Two times a day (BID) | ORAL | Status: DC
  Administered 2021-06-17 – 2021-06-21 (×5): 20 g via ORAL
  Filled 2021-06-17 (×11): qty 30

## 2021-06-17 MED ORDER — PROCHLORPERAZINE EDISYLATE 10 MG/2ML IJ SOLN: 5.0000 mg | Freq: Four times a day (QID) | INTRAMUSCULAR | Status: DC | PRN

## 2021-06-17 MED ORDER — RIFAXIMIN 550 MG PO TABS
550.0000 mg | ORAL_TABLET | Freq: Two times a day (BID) | ORAL | Status: DC
  Administered 2021-06-17 – 2021-06-23 (×12): 550 mg via ORAL
  Filled 2021-06-17 (×13): qty 1

## 2021-06-17 MED ORDER — DIPHENHYDRAMINE HCL 12.5 MG/5ML PO ELIX: 12.5000 mg | ORAL_SOLUTION | Freq: Four times a day (QID) | ORAL | Status: DC | PRN

## 2021-06-17 MED ORDER — PROCHLORPERAZINE 25 MG RE SUPP
12.5000 mg | Freq: Four times a day (QID) | RECTAL | Status: DC | PRN
Start: 2021-06-17 — End: 2021-06-23

## 2021-06-17 MED ORDER — FLEET ENEMA 7-19 GM/118ML RE ENEM: 1.0000 | ENEMA | Freq: Once | RECTAL | Status: DC | PRN

## 2021-06-17 MED ORDER — ADULT MULTIVITAMIN W/MINERALS CH
1.0000 | ORAL_TABLET | Freq: Every day | ORAL | Status: DC
  Administered 2021-06-18 – 2021-06-23 (×6): 1 via ORAL
  Filled 2021-06-17 (×6): qty 1

## 2021-06-17 MED ORDER — BISACODYL 5 MG PO TBEC: 5.0000 mg | DELAYED_RELEASE_TABLET | Freq: Every day | ORAL | Status: DC | PRN

## 2021-06-17 MED ORDER — PANTOPRAZOLE SODIUM 40 MG PO TBEC
40.0000 mg | DELAYED_RELEASE_TABLET | Freq: Every day | ORAL | Status: DC
Start: 2021-06-18 — End: 2021-06-23
  Administered 2021-06-18 – 2021-06-23 (×6): 40 mg via ORAL
  Filled 2021-06-17 (×6): qty 1

## 2021-06-17 MED ORDER — METHOCARBAMOL 500 MG PO TABS
500.0000 mg | ORAL_TABLET | Freq: Four times a day (QID) | ORAL | Status: DC | PRN
Start: 2021-06-17 — End: 2021-06-23

## 2021-06-17 MED ORDER — FOLIC ACID 1 MG PO TABS
1.0000 mg | ORAL_TABLET | Freq: Every day | ORAL | Status: DC
  Administered 2021-06-18 – 2021-06-23 (×6): 1 mg via ORAL
  Filled 2021-06-17 (×6): qty 1

## 2021-06-17 MED ORDER — PROCHLORPERAZINE MALEATE 5 MG PO TABS: 5.0000 mg | ORAL_TABLET | Freq: Four times a day (QID) | ORAL | Status: DC | PRN

## 2021-06-17 MED ORDER — ACETAMINOPHEN 325 MG PO TABS
325.0000 mg | ORAL_TABLET | ORAL | Status: DC | PRN
  Administered 2021-06-19 – 2021-06-22 (×2): 650 mg via ORAL
  Filled 2021-06-17 (×2): qty 2

## 2021-06-17 MED ORDER — INSULIN GLARGINE-YFGN 100 UNIT/ML ~~LOC~~ SOLN
15.0000 [IU] | Freq: Every day | SUBCUTANEOUS | Status: DC
  Administered 2021-06-18 – 2021-06-23 (×6): 15 [IU] via SUBCUTANEOUS
  Filled 2021-06-17 (×6): qty 0.15

## 2021-06-17 MED ORDER — TRAZODONE HCL 50 MG PO TABS
25.0000 mg | ORAL_TABLET | Freq: Every evening | ORAL | Status: DC | PRN
  Administered 2021-06-22: 50 mg via ORAL
  Administered 2021-06-22: 25 mg via ORAL
  Filled 2021-06-17 (×2): qty 1

## 2021-06-17 MED ORDER — PREDNISONE 20 MG PO TABS
40.0000 mg | ORAL_TABLET | Freq: Every day | ORAL | Status: DC
  Administered 2021-06-18 – 2021-06-23 (×6): 40 mg via ORAL
  Filled 2021-06-17 (×6): qty 2

## 2021-06-17 MED ORDER — THIAMINE HCL 100 MG PO TABS
100.0000 mg | ORAL_TABLET | Freq: Every day | ORAL | Status: DC
  Administered 2021-06-18 – 2021-06-23 (×6): 100 mg via ORAL
  Filled 2021-06-17 (×6): qty 1

## 2021-06-17 MED ORDER — ALUM & MAG HYDROXIDE-SIMETH 200-200-20 MG/5ML PO SUSP: 30.0000 mL | ORAL | Status: DC | PRN

## 2021-06-17 MED ORDER — INSULIN ASPART 100 UNIT/ML IJ SOLN
3.0000 [IU] | Freq: Three times a day (TID) | INTRAMUSCULAR | Status: DC
  Administered 2021-06-18 – 2021-06-22 (×8): 3 [IU] via SUBCUTANEOUS

## 2021-06-17 MED ORDER — GUAIFENESIN-DM 100-10 MG/5ML PO SYRP: 5.0000 mL | ORAL_SOLUTION | Freq: Four times a day (QID) | ORAL | Status: DC | PRN

## 2021-06-17 NOTE — Progress Notes (Signed)
PMR Admission Coordinator Pre-Admission Assessment ?  ?Patient: Eric Dennis is an 56 y.o., male ?MRN: [id number] ?DOB: 01/09/1966 ?Height: '6\' 4"'  (193 cm) ?Weight: 91.4 kg ?  ?Insurance Information ?HMO:     PPO:      PCP:      IPA:      80/20:      OTHER:  ?PRIMARY: U[employer] Medicaid      Policy#: [id number]      Subscriber: Pt ?CM Name: Annamary Carolin       Phone#: [phone]     Fax#: [phone] I received a call from [facility] on 4/10 granting approval for 7 days from date of admission with updates due on the 7th day.  ?Pre-Cert#: [id number]      Employer:  ?Eff Date: 12/07/2020 - 09/05/2021 ?Deductible: $0 (does not have deductible) ?OOP Max: $0 ?CIR: 100% coverage ?SNF: 100% coverage, first 90 days covered by [facility] Medicaid then members move to [facility] ?Outpatient: 100% coverage 27 visits per calendar year across all therapy disciplines combined ?Home Health:  100% coverage Home Health Aides services must be limited to 100 total visits per year per member ?DME: 100% coverage, limited by medical necessity ?Providers: In network   ?  ?SECONDARY:       Policy#:      Phone#:  ?  ?Financial Counselor:       Phone#:  ?  ?The ?Data Collection Information Summary? for patients in Inpatient Rehabilitation Facilities with attached ?Privacy Act [place] Records? was provided and verbally reviewed with: Patient ?  ?Emergency Contact Information ?Contact Information   ?  ?  Name Relation Home Work Mobile  ?  Treadway,Saundria Spouse [phone]   [phone]  ?  Maina,Barbara Mother [phone]      ?  ?   ?  ?  ?Current Medical History  ?Patient Admitting Diagnosis: Hepatic encephalopathy ?History of Present Illness: Pt is a 56 yo PMH EtOH abuse, alcoholic cirrhosis,  prior esophageal varices s/p banding, who presented to [facility] ED 4/2 with AMS. Admitted for Hepatic Encephalopathy. Patient was found to have acute kidney injury and significantly elevated LFTs.  Was started on IV steroids later transitioned to p.o.   Also received IV Rocephin for empiric treatment for SBP eventually stopped. Slowly renal function improved and his LFTs stabilized.  He was seen by gastroenterology who cleared the patient for discharge. PT. And family are aware prognosis is relatively poor and he is not a transplant candidate. PT/OT consulted and recommended CIR to assist return to PLOF. ?  ?  ?  ?Patient's medical record from [facility]  has been reviewed by the rehabilitation admission coordinator and physician. ?  ?Past Medical History  ?    ?Past Medical History:  ?Diagnosis Date  ? Elevated liver function tests    ? High cholesterol    ? Weight loss    ?  ?  ?Has the patient had major surgery during 100 days prior to admission? No ?  ?Family History   ?family history includes Diabetes in his mother; Heart disease in his maternal grandmother. ?  ?Current Medications ?  ?Current Facility-Administered Medications:  ?  chlordiazePOXIDE (LIBRIUM) capsule 5 mg, 5 mg, Oral, TID PRN, Freddi Starr, MD ?  chlorhexidine gluconate (MEDLINE KIT) (PERIDEX) 0.12 % solution 15 mL, 15 mL, Mouth Rinse, BID, Margaretha Seeds, MD, 15 mL at 06/16/21 1006 ?  docusate sodium (COLACE) capsule 100 mg, 100 mg, Oral, BID PRN, Bowser,  Laurel Dimmer, NP ?  folic acid (FOLVITE) tablet 1 mg, 1 mg, Oral, Daily, Barb Merino, MD, 1 mg at 06/16/21 1005 ?  insulin aspart (novoLOG) injection 0-15 Units, 0-15 Units, Subcutaneous, Q4H, Jacky Kindle, MD, 1 Units at 06/16/21 1415 ?  insulin glargine-yfgn (SEMGLEE) injection 20 Units, 20 Units, Subcutaneous, Daily, Barb Merino, MD, 20 Units at 06/16/21 1005 ?  ipratropium-albuterol (DUONEB) 0.5-2.5 (3) MG/3ML nebulizer solution 3 mL, 3 mL, Nebulization, Q6H PRN, Jacky Kindle, MD ?  lactulose (CHRONULAC) 10 GM/15ML solution 20 g, 20 g, Oral, BID, Ghimire, Kuber, MD, 20 g at 06/16/21 1005 ?  multivitamin with minerals tablet 1 tablet, 1 tablet, Oral, Daily, Barb Merino, MD, 1 tablet at 06/16/21 1005 ?   pantoprazole (PROTONIX) EC tablet 40 mg, 40 mg, Oral, Daily, Ghimire, Kuber, MD, 40 mg at 06/16/21 1005 ?  polyethylene glycol (MIRALAX / GLYCOLAX) packet 17 g, 17 g, Oral, Daily PRN, Bowser, Laurel Dimmer, NP ?  predniSONE (DELTASONE) tablet 40 mg, 40 mg, Oral, Q breakfast, Ghimire, Kuber, MD, 40 mg at 06/16/21 1005 ?  rifaximin (XIFAXAN) tablet 550 mg, 550 mg, Oral, BID, Barb Merino, MD, 550 mg at 06/16/21 1005 ?  thiamine tablet 100 mg, 100 mg, Oral, Daily, Ghimire, Kuber, MD, 100 mg at 06/16/21 1005 ?  ?Patients Current Diet:  ?Diet Order   ?  ?         ?    Diet regular Room service appropriate? Yes with Assist; Fluid consistency: Thin  Diet effective now       ?  ?  ?   ?  ?  ?   ?  ?  ?Precautions / Restrictions ?Precautions ?Precautions: Fall, Other (comment) ?Precaution Comments: multiple falls at home ?Restrictions ?Weight Bearing Restrictions: No  ?  ?Has the patient had 2 or more falls or a fall with injury in the past year? No ?  ?Prior Activity Level ?Community (5-7x/wk): Pt. was active in the community PTA ?  ?Prior Functional Level ?Self Care: Did the patient need help bathing, dressing, using the toilet or eating? Independent ?  ?Indoor Mobility: Did the patient need assistance with walking from room to room (with or without device)? Independent ?  ?Stairs: Did the patient need assistance with internal or external stairs (with or without device)? Independent ?  ?Functional Cognition: Did the patient need help planning regular tasks such as shopping or remembering to take medications? Independent ?  ?Patient Information ?Are you of Hispanic, Latino/a,or Spanish origin?: A. No, not of Hispanic, Latino/a, or Spanish origin ?What is your race?: B. Black or African American ?Do you need or want an interpreter to communicate with a doctor or health care staff?: 0. No ?  ?Patient's Response To:  ?Health Literacy and Transportation ?Is the patient able to respond to health literacy and transportation needs?:  Yes ?Health Literacy - How often do you need to have someone help you when you read instructions, pamphlets, or other written material from your doctor or pharmacy?: Never ?In the past 12 months, has lack of transportation kept you from medical appointments or from getting medications?: Yes ?In the past 12 months, has lack of transportation kept you from meetings, work, or from getting things needed for daily living?: Yes ?  ?Home Assistive Devices / Equipment ?Home Assistive Devices/Equipment: None ?Home Equipment: Conservation officer, nature (2 wheels), Shower seat ?  ?Prior Device Use: Indicate devices/aids used by the patient prior to current illness, exacerbation or injury? None of the above ?  ?  Current Functional Level ?Cognition ?  Overall Cognitive Status: No family/caregiver present to determine baseline cognitive functioning ?Orientation Level: Oriented X4 ?Safety/Judgement: Decreased awareness of safety, Decreased awareness of deficits ?General Comments: Pt able to state need for the RW to stand and try to ambulate to the sink when asked. ?   ?Extremity Assessment ?(includes Sensation/Coordination) ?  Upper Extremity Assessment: Generalized weakness  ?Lower Extremity Assessment: Generalized weakness  ?   ?ADLs ?  Overall ADL's : Needs assistance/impaired ?Eating/Feeding: Set up, Sitting ?Grooming: Set up, Sitting ?Upper Body Bathing: Minimal assistance, Sitting ?Lower Body Bathing: Moderate assistance, Sit to/from stand ?Upper Body Dressing : Set up, Sitting ?Lower Body Dressing: Moderate assistance, Sit to/from stand ?Toilet Transfer: Moderate assistance, BSC/3in1, Rolling walker (2 wheels) ?Toilet Transfer Details (indicate cue type and reason): simulated stand pivot ?Toileting- Clothing Manipulation and Hygiene: Moderate assistance, Sit to/from stand ?Toileting - Clothing Manipulation Details (indicate cue type and reason): pt needed 3 attempts to stand to complete cleaning ?Functional mobility during ADLs:  Moderate assistance, Rolling walker (2 wheels) ?General ADL Comments: Pt with low endurance, only able to stand for up to one minute at a time when attempting to complete functional mobility and standing to wash

## 2021-06-17 NOTE — Plan of Care (Signed)

## 2021-06-17 NOTE — Progress Notes (Signed)
Stable, no acute events overnight.  ?No complaints this morning, wife at bedside.  ?Dc summary completed yesterday on 06/16/21 ? ?Gerlean Ren MD ?

## 2021-06-17 NOTE — H&P (Signed)
? ? ?Physical Medicine and Rehabilitation Admission H&P ? ?CC: Functional deficits secondary to hepatic encephalopathy related to alcoholic cirrhosis ? ?HPI: Eric Dennis is a 56 year old male who was admitted on 06/08/2021 secondary to decompensated alcoholic cirrhosis, hematemesis and altered mental status.  He required admission to intensive care/critical care medicine, intubation and mechanical ventilation.  Imaging was consistent with cirrhosis and portal hypertension and cholelithiasis.  No ascites present.  Gastroenterology consultation obtained on 4/30 by Dr. Michail Sermon. Supportive care with Protonix and octreotide and monitoring H&H.  Completed antibiotic treatment for SBP.  Nephrology consultation on 4/3 due to hypovolemic shock, elevated creatinine to 3.14 and borderline oliguria.  Appropriate treatment for hepatic renal syndrome including steroids and albumin as well as Levophed ongoing.  Plan for methylprednisolone IV for 4 weeks of therapy.  Transfused 1 unit of packed red blood cells on 4/4 for hemoglobin of 6.9.  Spontaneous breathing trials initiated 4/5.  Diabetes coordinator consulted on 4/5 and recommended NovoLog 2 units every 4 hours while on steroids and tube feeds.  Core track placed to left nare 4/5.  Patient showed signs of recovery from acute kidney injury with improving creatinine and urine output.  Extubated 4/5.  Mental status clear.  Palliative care consultation on 4/7.  Transition to hospitalist service 4/7.  Follow-up palliative care consultation on 4/8.  Patient remains full code.  A MOST and advanced directive provided to patient and spouse.  History urology signs off with recommendation to follow-up with Dr. Deno Etienne in 3 to 4 weeks after discharge from rehab.  Diet advanced to low-sodium diabetic diet.  Now on oral prednisone 40 mg daily. Not considered candidate for liver transplantation. The patient requires inpatient medicine and rehabilitation evaluations and services for ongoing  dysfunction secondary to functional deficits related to alcoholic cirrhosis. Currently has bilateral lower extremity edema.  ? ?Review of Systems  ?Constitutional: Negative.   ?HENT: Negative.    ?Eyes: Negative.   ?Respiratory: Negative.    ?Cardiovascular:  Positive for leg swelling.  ?Gastrointestinal: Negative.   ?Genitourinary: Negative.   ?Musculoskeletal: Negative.   ?Skin: Negative.   ?Neurological:  Positive for weakness.  ?Endo/Heme/Allergies: Negative.   ?Psychiatric/Behavioral: Negative.    ?Past Medical History:  ?Diagnosis Date  ? Elevated liver function tests   ? High cholesterol   ? Weight loss   ? ?Past Surgical History:  ?Procedure Laterality Date  ? COLONOSCOPY N/A 03/14/2014  ? Procedure: COLONOSCOPY;  Surgeon: Rogene Houston, MD;  Location: [facility];  Service: Endoscopy;  Laterality: N/A;  730 - moved to 1/6 @ 12:00 - Ann notified pt  ? COLONOSCOPY WITH PROPOFOL N/A 11/04/2020  ? Procedure: COLONOSCOPY WITH PROPOFOL;  Surgeon: Ronnette Juniper, MD;  Location: [facility] ENDOSCOPY;  Service: Gastroenterology;  Laterality: N/A;  ? ESOPHAGEAL BANDING  11/04/2020  ? Procedure: ESOPHAGEAL BANDING;  Surgeon: Ronnette Juniper, MD;  Location: [facility];  Service: Gastroenterology;;  ? ESOPHAGOGASTRODUODENOSCOPY N/A 05/13/2021  ? Procedure: ESOPHAGOGASTRODUODENOSCOPY (EGD);  Surgeon: Ronnette Juniper, MD;  Location: [facility];  Service: Gastroenterology;  Laterality: N/A;  ? ESOPHAGOGASTRODUODENOSCOPY (EGD) WITH PROPOFOL N/A 11/04/2020  ? Procedure: ESOPHAGOGASTRODUODENOSCOPY (EGD) WITH PROPOFOL;  Surgeon: Ronnette Juniper, MD;  Location: [facility] ENDOSCOPY;  Service: Gastroenterology;  Laterality: N/A;  ? IR PARACENTESIS  07/19/2020  ? NO PAST SURGERIES    ? POLYPECTOMY  11/04/2020  ? Procedure: POLYPECTOMY;  Surgeon: Ronnette Juniper, MD;  Location: [facility];  Service: Gastroenterology;;  ? ?Family History  ?Problem Relation Age of Onset  ? Diabetes Mother   ?  Heart disease Maternal Grandmother   ? ?Social History:  reports that he  has been smoking cigarettes. He has a 5.00 pack-year smoking history. He has never used smokeless tobacco. He reports current alcohol use of about 14.0 standard drinks per week. He reports that he does not use drugs. ?Allergies: No Known Allergies ?Medications Prior to Admission  ?Medication Sig Dispense Refill  ? calcium carbonate (TUMS - DOSED IN MG ELEMENTAL CALCIUM) 500 MG chewable tablet Chew 2 tablets by mouth 2 (two) times daily as needed for indigestion or heartburn.    ? Cholecalciferol (VITAMIN D3) 125 MCG (5000 UT) TABS Take 5,000 Units by mouth every evening.    ? docusate sodium (COLACE) 100 MG capsule Take 1 capsule (100 mg total) by mouth 2 (two) times daily as needed for mild constipation. 10 capsule 0  ? folic acid (FOLVITE) 1 MG tablet Take 1 tablet (1 mg total) by mouth daily. (Patient taking differently: Take 1 mg by mouth every evening.) 30 tablet 1  ? furosemide (LASIX) 40 MG tablet Take 40 mg by mouth every evening.    ? ibuprofen (ADVIL) 200 MG tablet Take 400 mg by mouth every 6 (six) hours as needed for headache or moderate pain.    ? insulin glargine-yfgn (SEMGLEE) 100 UNIT/ML injection Inject 0.2 mLs (20 Units total) into the skin daily. 10 mL 11  ? lactulose (CHRONULAC) 10 GM/15ML solution Take 15 mLs (10 g total) by mouth 2 (two) times daily. (Patient not taking: Reported on 06/08/2021) 946 mL 2  ? ondansetron (ZOFRAN) 8 MG tablet Take 1 tablet (8 mg total) by mouth every 8 (eight) hours as needed for nausea or vomiting. (Patient taking differently: Take 8 mg by mouth See admin instructions. Take 1 tablet (8 mg) by mouth scheduled every night & may take 2 additional dose if needed for nausea/vomiting.) 20 tablet 0  ? pantoprazole (PROTONIX) 40 MG tablet Take 1 tablet (40 mg total) by mouth daily. (Patient taking differently: Take 40 mg by mouth every evening.) 30 tablet 1  ? Potassium 99 MG TABS Take 99 mg by mouth every evening.    ? predniSONE (DELTASONE) 20 MG tablet Take 2 tablets (40  mg total) by mouth daily with breakfast for 25 days.    ? rifaximin (XIFAXAN) 550 MG TABS tablet Take 1 tablet (550 mg total) by mouth 2 (two) times daily. 42 tablet   ? spironolactone (ALDACTONE) 100 MG tablet Take 100 mg by mouth every evening.    ? thiamine 100 MG tablet Take 1 tablet (100 mg total) by mouth daily. (Patient taking differently: Take 100 mg by mouth every evening.)    ? vitamin B-12 (CYANOCOBALAMIN) 500 MCG tablet Take 500 mcg by mouth every evening.    ? ?Home: ?Home Living ?Family/patient expects to be discharged to:: Private residence ?Living Arrangements: Spouse/significant other ?Available Help at Discharge: Family, Available 24 hours/day ?Type of Home: House ?Home Access: Level entry ?Home Layout: One level ?Bathroom Shower/Tub: Tub/shower unit ?Bathroom Toilet: Handicapped height ?Bathroom Accessibility: Yes ?Home Equipment: Conservation officer, nature (2 wheels), Shower seat ?Additional Comments: states his wife works and his [place] is there 24/7 ? Lives With: Spouse ?  ?Functional History: ?Prior Function ?Prior Level of Function : Independent/Modified Independent ?Mobility Comments: does not drive. Able to ambulate community distances but does tire quickly with grocery shopping. ?ADLs Comments: indep but reportrs falling ?  ?Functional Status:  ?Mobility: ?Bed Mobility ?Overal bed mobility: Needs Assistance ?Bed Mobility: Supine to Sit, Sit  to Supine ?Supine to sit: Supervision ?Sit to supine: Mod assist ?General bed mobility comments: no cues needed ?Transfers ?Overall transfer level: Needs assistance ?Equipment used: Rolling walker (2 wheels) ?Transfers: Sit to/from Stand ?Sit to Stand: Mod assist (from lowered EOB) ?General transfer comment: cues for hand placement and sequencing, repeated x 6 reps ?Ambulation/Gait ?Ambulation/Gait assistance: Min assist ?Gait Distance (Feet): 50 Feet ?Assistive device: Rolling walker (2 wheels) ?Gait Pattern/deviations: Step-through pattern, Decreased stride  length ?General Gait Details: slightly unsteady gait; cues for sequencing, and proper use of RW ?Gait velocity: decreased ?  ?ADL: ?ADL ?Overall ADL's : Needs assistance/impaired ?Eating/Feeding: Set up, Sitt

## 2021-06-17 NOTE — Progress Notes (Incomplete)
Inpatient Rehabilitation Admission Medication Review by a Pharmacist ? ?A complete drug regimen review was completed for this patient to identify any potential clinically significant medication issues. ? ?High Risk Drug Classes Is patient taking? Indication by Medication  ?Antipsychotic No   ?Anticoagulant No   ?Antibiotic Yes Rifaxmin- hepatic encephalopathy  ?Opioid No   ?Antiplatelet No   ?Hypoglycemics/insulin Yes iSS, semglee- T2DM  ?Vasoactive Medication No   ?Chemotherapy No   ?Other Yes Folic acid, Thiamine- deficiency 2/2 SUD- alcohol ?Prednisone- acute alcoholic hepatitis ?Lactulose- hyperammonemia 2/2 decompensated alcoholic cirrhosis  ? ? ? ?Type of Medication Issue Identified Description of Issue Recommendation(s)  ?Drug Interaction(s) (clinically significant) ?    ?Duplicate Therapy ?    ?Allergy ?    ?No Medication Administration End Date ? Prednisone Per discharge summary, continue through 07/12/2021 to complete a 1 month course  ?Incorrect Dose ?    ?Additional Drug Therapy Needed ?    ?Significant med changes from prior encounter (inform family/care partners about these prior to discharge).    ?Other ? PTA meds: ?Lasix ?Vitamin D3 ?Protonix ?Potassium OTC ?Aldactone ?Vitamin B-12 Restart PTA meds when and if necessary on during CIR admission or at the time of discharge with the following exception(s):  ?Potassium OTC- patient is on a potassium-sparing diuretic PTA (spironolactone) which could lead to hyperkalemia  ? ? ?Clinically significant medication issues were identified that warrant physician communication and completion of prescribed/recommended actions by midnight of the next day:  No ? ? ?Time spent performing this drug regimen review (minutes):  30 ? ? ?Daril Warga BS, PharmD, BCPS ?Clinical Pharmacist ?06/19/2021 11:11 AM ? ?Contact: [phone] after 3 PM ? ?"Be curious, not judgmental..." -Jamal Maes ?

## 2021-06-17 NOTE — TOC Benefit Eligibility Note (Signed)
Patient Advocate Encounter ?  ?Received notification that prior authorization for Xifaxan '550MG'$  tablets is required. ?  ?PA submitted on 06/17/2021 ?Key [id number] ?Status is pending ?   ? ? ? ?Lyndel Safe, CPhT ?Pharmacy Patient Advocate Specialist ?[place] Patient Advocate Team ?Direct Number: [phone]  Fax: [phone]  ?

## 2021-06-17 NOTE — TOC Benefit Eligibility Note (Signed)
Patient Advocate Encounter ? ?Prior Authorization for Xifaxan '550MG'$  tablets has been approved.   ? ?PA# [id number] ?Effective dates: 06/17/2021 through 06/18/2022 ? ?Patients co-pay is $4.00 (will only pay for a 28 day supply).  ? ? ? ?Lyndel Safe, CPhT ?Pharmacy Patient Advocate Specialist ?[place] Patient Advocate Team ?Direct Number: [phone]  Fax: [phone]  ?

## 2021-06-17 NOTE — TOC Benefit Eligibility Note (Signed)
Patient Advocate Encounter ? ?Insurance verification completed.   ? ?The patient is currently admitted and upon discharge could be taking Xifaxan 550 mg. ? ?Requires Prior Authorization ? ?The patient is insured through [employer] [place]  ? ? ? ?Lyndel Safe, CPhT ?Pharmacy Patient Advocate Specialist ?[place] Patient Advocate Team ?Direct Number: [phone]  Fax: [phone] ? ? ? ? ? ?  ?

## 2021-06-17 NOTE — H&P (Incomplete)
? ? ?Physical Medicine and Rehabilitation Admission H&P ? ?  ?Chief Complaint  ?Patient presents with  ? Altered Mental Status  ?CC: Functional deficits secondary to hepatic encephalopathy related to alcoholic cirrhosis ? ?HPI: Eric Dennis is a 56 year old male who was admitted on 06/08/2021 secondary to decompensated alcoholic cirrhosis, hematemesis and altered mental status.  He required admission to intensive care/critical care medicine, intubation and mechanical ventilation.  Imaging was consistent with cirrhosis and portal hypertension and cholelithiasis.  No ascites present.  Gastroenterology consultation obtained on 4/30 by Dr. Michail Sermon. Supportive care with Protonix and octreotide and monitoring H&H.  Completed antibiotic treatment for SBP.  Nephrology consultation on 4/3 due to hypovolemic shock, elevated creatinine to 3.14 and borderline oliguria.  Appropriate treatment for hepatic renal syndrome including steroids and albumin as well as Levophed ongoing.  Plan for methylprednisolone IV for 4 weeks of therapy.  Transfused 1 unit of packed red blood cells on 4/4 for hemoglobin of 6.9.  Spontaneous breathing trials initiated 4/5.  Diabetes coordinator consulted on 4/5 and recommended NovoLog 2 units every 4 hours while on steroids and tube feeds.  Core track placed to left nare 4/5.  Patient showed signs of recovery from acute kidney injury with improving creatinine and urine output.  Extubated 4/5.  Mental status clear.  Palliative care consultation on 4/7.  Transition to hospitalist service 4/7.  Follow-up palliative care consultation on 4/8.  Patient remains full code.  A MOST and advanced directive provided to patient and spouse.  History urology signs off with recommendation to follow-up with Dr. Deno Etienne in 3 to 4 weeks after discharge from rehab.  Diet advanced to low-sodium diabetic diet.  Now on oral prednisone 40 mg daily. Not considered candidate for liver transplantation. The patient requires  inpatient medicine and rehabilitation evaluations and services for ongoing dysfunction secondary to functional deficits related to alcoholic cirrhosis. ? ?Review of Systems  ?Constitutional:  Negative for chills and fever.  ?Respiratory:  Negative for cough and sputum production.   ?Cardiovascular:  Positive for leg swelling. Negative for chest pain.  ?Gastrointestinal:  Negative for nausea and vomiting.  ?Genitourinary:  Negative for dysuria and urgency.  ?Past Medical History:  ?Diagnosis Date  ? Elevated liver function tests   ? High cholesterol   ? Weight loss   ? ?Past Surgical History:  ?Procedure Laterality Date  ? COLONOSCOPY N/A 03/14/2014  ? Procedure: COLONOSCOPY;  Surgeon: Rogene Houston, MD;  Location: [facility];  Service: Endoscopy;  Laterality: N/A;  730 - moved to 1/6 @ 12:00 - Ann notified pt  ? COLONOSCOPY WITH PROPOFOL N/A 11/04/2020  ? Procedure: COLONOSCOPY WITH PROPOFOL;  Surgeon: Ronnette Juniper, MD;  Location: [facility];  Service: Gastroenterology;  Laterality: N/A;  ? ESOPHAGEAL BANDING  11/04/2020  ? Procedure: ESOPHAGEAL BANDING;  Surgeon: Ronnette Juniper, MD;  Location: [facility];  Service: Gastroenterology;;  ? ESOPHAGOGASTRODUODENOSCOPY N/A 05/13/2021  ? Procedure: ESOPHAGOGASTRODUODENOSCOPY (EGD);  Surgeon: Ronnette Juniper, MD;  Location: [facility];  Service: Gastroenterology;  Laterality: N/A;  ? ESOPHAGOGASTRODUODENOSCOPY (EGD) WITH PROPOFOL N/A 11/04/2020  ? Procedure: ESOPHAGOGASTRODUODENOSCOPY (EGD) WITH PROPOFOL;  Surgeon: Ronnette Juniper, MD;  Location: [facility];  Service: Gastroenterology;  Laterality: N/A;  ? IR PARACENTESIS  07/19/2020  ? NO PAST SURGERIES    ? POLYPECTOMY  11/04/2020  ? Procedure: POLYPECTOMY;  Surgeon: Ronnette Juniper, MD;  Location: [facility];  Service: Gastroenterology;;  ? ?Family History  ?Problem Relation Age of Onset  ? Diabetes Mother   ? Heart  disease Maternal Grandmother   ? ?Social History:  reports that he has been smoking cigarettes. He has a 5.00  pack-year smoking history. He has never used smokeless tobacco. He reports current alcohol use of about 14.0 standard drinks per week. He reports that he does not use drugs. ?Allergies: No Known Allergies ?Medications Prior to Admission  ?Medication Sig Dispense Refill  ? calcium carbonate (TUMS - DOSED IN MG ELEMENTAL CALCIUM) 500 MG chewable tablet Chew 2 tablets by mouth 2 (two) times daily as needed for indigestion or heartburn.    ? Cholecalciferol (VITAMIN D3) 125 MCG (5000 UT) TABS Take 5,000 Units by mouth every evening.    ? folic acid (FOLVITE) 1 MG tablet Take 1 tablet (1 mg total) by mouth daily. (Patient taking differently: Take 1 mg by mouth every evening.) 30 tablet 1  ? furosemide (LASIX) 40 MG tablet Take 40 mg by mouth every evening.    ? ibuprofen (ADVIL) 200 MG tablet Take 400 mg by mouth every 6 (six) hours as needed for headache or moderate pain.    ? ondansetron (ZOFRAN) 8 MG tablet Take 1 tablet (8 mg total) by mouth every 8 (eight) hours as needed for nausea or vomiting. (Patient taking differently: Take 8 mg by mouth See admin instructions. Take 1 tablet (8 mg) by mouth scheduled every night & may take 2 additional dose if needed for nausea/vomiting.) 20 tablet 0  ? pantoprazole (PROTONIX) 40 MG tablet Take 1 tablet (40 mg total) by mouth daily. (Patient taking differently: Take 40 mg by mouth every evening.) 30 tablet 1  ? Potassium 99 MG TABS Take 99 mg by mouth every evening.    ? spironolactone (ALDACTONE) 100 MG tablet Take 100 mg by mouth every evening.    ? thiamine 100 MG tablet Take 1 tablet (100 mg total) by mouth daily. (Patient taking differently: Take 100 mg by mouth every evening.)    ? vitamin B-12 (CYANOCOBALAMIN) 500 MCG tablet Take 500 mcg by mouth every evening.    ? lactulose (CHRONULAC) 10 GM/15ML solution Take 15 mLs (10 g total) by mouth 2 (two) times daily. (Patient not taking: Reported on 06/08/2021) 946 mL 2  ? ? ? ? ?Home: ?Home Living ?Family/patient expects to be  discharged to:: Private residence ?Living Arrangements: Spouse/significant other ?Available Help at Discharge: Family, Available 24 hours/day ?Type of Home: House ?Home Access: Level entry ?Home Layout: One level ?Bathroom Shower/Tub: Tub/shower unit ?Bathroom Toilet: Handicapped height ?Bathroom Accessibility: Yes ?Home Equipment: Conservation officer, nature (2 wheels), Shower seat ?Additional Comments: states his wife works and his [place] is there 24/7 ? Lives With: Spouse ?  ?Functional History: ?Prior Function ?Prior Level of Function : Independent/Modified Independent ?Mobility Comments: does not drive. Able to ambulate community distances but does tire quickly with grocery shopping. ?ADLs Comments: indep but reportrs falling ? ?Functional Status:  ?Mobility: ?Bed Mobility ?Overal bed mobility: Needs Assistance ?Bed Mobility: Supine to Sit, Sit to Supine ?Supine to sit: Supervision ?Sit to supine: Mod assist ?General bed mobility comments: no cues needed ?Transfers ?Overall transfer level: Needs assistance ?Equipment used: Rolling walker (2 wheels) ?Transfers: Sit to/from Stand ?Sit to Stand: Mod assist (from lowered EOB) ?General transfer comment: cues for hand placement and sequencing, repeated x 6 reps ?Ambulation/Gait ?Ambulation/Gait assistance: Min assist ?Gait Distance (Feet): 50 Feet ?Assistive device: Rolling walker (2 wheels) ?Gait Pattern/deviations: Step-through pattern, Decreased stride length ?General Gait Details: slightly unsteady gait; cues for sequencing, and proper use of RW ?Gait velocity:  decreased ?  ? ?ADL: ?ADL ?Overall ADL's : Needs assistance/impaired ?Eating/Feeding: Set up, Sitting ?Grooming: Set up, Sitting ?Upper Body Bathing: Minimal assistance, Sitting ?Lower Body Bathing: Moderate assistance, Sit to/from stand ?Upper Body Dressing : Set up, Sitting ?Lower Body Dressing: Moderate assistance, Sit to/from stand ?Toilet Transfer: Moderate assistance, BSC/3in1, Rolling walker (2 wheels) ?Toilet  Transfer Details (indicate cue type and reason): simulated stand pivot ?Toileting- Clothing Manipulation and Hygiene: Moderate assistance, Sit to/from stand ?Toileting - Clothing Manipulation Details (indicate cue

## 2021-06-17 NOTE — Progress Notes (Addendum)
Inpatient Rehab Admissions Coordinator:  ? ?I have a bed for this Pt. On CIR and can admit today. RN may call report to [phone]. ? ?Clemens Catholic, MS, CCC-SLP ?Rehab Admissions Coordinator  ?[phone] (celll) ?[phone] (office)  ?

## 2021-06-17 NOTE — Progress Notes (Signed)
Inpatient Diabetes Program Recommendations ? ?AACE/ADA: New Consensus Statement on Inpatient Glycemic Control (2015) ? ?Target Ranges:  Prepandial:   less than 140 mg/dL ?     Peak postprandial:   less than 180 mg/dL (1-2 hours) ?     Critically ill patients:  140 - 180 mg/dL  ? ?Lab Results  ?Component Value Date  ? GLUCAP 100 (H) 06/17/2021  ? HGBA1C 4.6 (L) 06/09/2021  ? ? ?Review of Glycemic Control ? Latest Reference Range & Units 06/16/21 17:32 06/16/21 19:31 06/16/21 23:27 06/17/21 03:32 06/17/21 08:59  ?Glucose-Capillary 70 - 99 mg/dL 270 (H) 243 (H) 149 (H) 111 (H) 100 (H)  ? ?Diabetes history: None ?Outpatient Diabetes medications: None ?Current orders for Inpatient glycemic control:  ?Novolog moderate q 4 hours ?Semglee 20 units daily ?Prednisone 40 mg with breakfast ? ?Inpatient Diabetes Program Recommendations:   ? ?Note plans for transfer to CIR.   ?Consider Lantus 15 units daily, Novolog 3 units tid with meals (Meal coverage-hold if patient eats less than 50% or NPO), and Novolog sensitive tid with meals (0-9).  Looks like he will need this while on Prednisone (which is scheduled until May 6?).  ? ?Thanks,  ?Adah Perl, RN, BC-ADM ?Inpatient Diabetes Coordinator ?Pager [phone]  (8a-5p) ? ?

## 2021-06-17 NOTE — H&P (Signed)
? ? ?Physical Medicine and Rehabilitation Admission H&P ? ?CC: Functional deficits secondary to hepatic encephalopathy related to alcoholic cirrhosis ? ?HPI: Eric Dennis is a 56 year old male who was admitted on 06/08/2021 secondary to decompensated alcoholic cirrhosis, hematemesis and altered mental status.  He required admission to intensive care/critical care medicine, intubation and mechanical ventilation.  Imaging was consistent with cirrhosis and portal hypertension and cholelithiasis.  No ascites present.  Gastroenterology consultation obtained on 4/30 by Dr. Michail Sermon. Supportive care with Protonix and octreotide and monitoring H&H.  Completed antibiotic treatment for SBP.  Nephrology consultation on 4/3 due to hypovolemic shock, elevated creatinine to 3.14 and borderline oliguria.  Appropriate treatment for hepatic renal syndrome including steroids and albumin as well as Levophed ongoing.  Plan for methylprednisolone IV for 4 weeks of therapy.  Transfused 1 unit of packed red blood cells on 4/4 for hemoglobin of 6.9.  Spontaneous breathing trials initiated 4/5.  Diabetes coordinator consulted on 4/5 and recommended NovoLog 2 units every 4 hours while on steroids and tube feeds.  Core track placed to left nare 4/5.  Patient showed signs of recovery from acute kidney injury with improving creatinine and urine output.  Extubated 4/5.  Mental status clear.  Palliative care consultation on 4/7.  Transition to hospitalist service 4/7.  Follow-up palliative care consultation on 4/8.  Patient remains full code.  A MOST and advanced directive provided to patient and spouse.  History urology signs off with recommendation to follow-up with Dr. Deno Etienne in 3 to 4 weeks after discharge from rehab.  Diet advanced to low-sodium diabetic diet.  Now on oral prednisone 40 mg daily. Not considered candidate for liver transplantation. The patient requires inpatient medicine and rehabilitation evaluations and services for ongoing  dysfunction secondary to functional deficits related to alcoholic cirrhosis. ? ?Review of Systems  ?Constitutional: Negative.   ?HENT: Negative.    ?Eyes: Negative.   ?Respiratory: Negative.    ?Cardiovascular:  Positive for leg swelling.  ?Gastrointestinal: Negative.   ?Genitourinary: Negative.   ?Musculoskeletal: Negative.   ?Skin: Negative.   ?Neurological:  Positive for weakness.  ?Endo/Heme/Allergies: Negative.   ?Psychiatric/Behavioral: Negative.    ?Past Medical History:  ?Diagnosis Date  ? Elevated liver function tests   ? High cholesterol   ? Weight loss   ? ?Past Surgical History:  ?Procedure Laterality Date  ? COLONOSCOPY N/A 03/14/2014  ? Procedure: COLONOSCOPY;  Surgeon: Rogene Houston, MD;  Location: [facility];  Service: Endoscopy;  Laterality: N/A;  730 - moved to 1/6 @ 12:00 - Ann notified pt  ? COLONOSCOPY WITH PROPOFOL N/A 11/04/2020  ? Procedure: COLONOSCOPY WITH PROPOFOL;  Surgeon: Ronnette Juniper, MD;  Location: [facility];  Service: Gastroenterology;  Laterality: N/A;  ? ESOPHAGEAL BANDING  11/04/2020  ? Procedure: ESOPHAGEAL BANDING;  Surgeon: Ronnette Juniper, MD;  Location: [facility];  Service: Gastroenterology;;  ? ESOPHAGOGASTRODUODENOSCOPY N/A 05/13/2021  ? Procedure: ESOPHAGOGASTRODUODENOSCOPY (EGD);  Surgeon: Ronnette Juniper, MD;  Location: [facility];  Service: Gastroenterology;  Laterality: N/A;  ? ESOPHAGOGASTRODUODENOSCOPY (EGD) WITH PROPOFOL N/A 11/04/2020  ? Procedure: ESOPHAGOGASTRODUODENOSCOPY (EGD) WITH PROPOFOL;  Surgeon: Ronnette Juniper, MD;  Location: [facility];  Service: Gastroenterology;  Laterality: N/A;  ? IR PARACENTESIS  07/19/2020  ? NO PAST SURGERIES    ? POLYPECTOMY  11/04/2020  ? Procedure: POLYPECTOMY;  Surgeon: Ronnette Juniper, MD;  Location: [facility];  Service: Gastroenterology;;  ? ?Family History  ?Problem Relation Age of Onset  ? Diabetes Mother   ? Heart disease Maternal Grandmother   ? ?  Social History:  reports that he has been smoking cigarettes. He has a 5.00  pack-year smoking history. He has never used smokeless tobacco. He reports current alcohol use of about 14.0 standard drinks per week. He reports that he does not use drugs. ?Allergies: No Known Allergies ?Medications Prior to Admission  ?Medication Sig Dispense Refill  ? calcium carbonate (TUMS - DOSED IN MG ELEMENTAL CALCIUM) 500 MG chewable tablet Chew 2 tablets by mouth 2 (two) times daily as needed for indigestion or heartburn.    ? Cholecalciferol (VITAMIN D3) 125 MCG (5000 UT) TABS Take 5,000 Units by mouth every evening.    ? folic acid (FOLVITE) 1 MG tablet Take 1 tablet (1 mg total) by mouth daily. (Patient taking differently: Take 1 mg by mouth every evening.) 30 tablet 1  ? furosemide (LASIX) 40 MG tablet Take 40 mg by mouth every evening.    ? ibuprofen (ADVIL) 200 MG tablet Take 400 mg by mouth every 6 (six) hours as needed for headache or moderate pain.    ? ondansetron (ZOFRAN) 8 MG tablet Take 1 tablet (8 mg total) by mouth every 8 (eight) hours as needed for nausea or vomiting. (Patient taking differently: Take 8 mg by mouth See admin instructions. Take 1 tablet (8 mg) by mouth scheduled every night & may take 2 additional dose if needed for nausea/vomiting.) 20 tablet 0  ? pantoprazole (PROTONIX) 40 MG tablet Take 1 tablet (40 mg total) by mouth daily. (Patient taking differently: Take 40 mg by mouth every evening.) 30 tablet 1  ? Potassium 99 MG TABS Take 99 mg by mouth every evening.    ? spironolactone (ALDACTONE) 100 MG tablet Take 100 mg by mouth every evening.    ? thiamine 100 MG tablet Take 1 tablet (100 mg total) by mouth daily. (Patient taking differently: Take 100 mg by mouth every evening.)    ? vitamin B-12 (CYANOCOBALAMIN) 500 MCG tablet Take 500 mcg by mouth every evening.    ? lactulose (CHRONULAC) 10 GM/15ML solution Take 15 mLs (10 g total) by mouth 2 (two) times daily. (Patient not taking: Reported on 06/08/2021) 946 mL 2  ? ?Home: ?Home Living ?Family/patient expects to be  discharged to:: Private residence ?Living Arrangements: Spouse/significant other ?Available Help at Discharge: Family, Available 24 hours/day ?Type of Home: House ?Home Access: Level entry ?Home Layout: One level ?Bathroom Shower/Tub: Tub/shower unit ?Bathroom Toilet: Handicapped height ?Bathroom Accessibility: Yes ?Home Equipment: Conservation officer, nature (2 wheels), Shower seat ?Additional Comments: states his wife works and his [place] is there 24/7 ? Lives With: Spouse ?  ?Functional History: ?Prior Function ?Prior Level of Function : Independent/Modified Independent ?Mobility Comments: does not drive. Able to ambulate community distances but does tire quickly with grocery shopping. ?ADLs Comments: indep but reportrs falling ?  ?Functional Status:  ?Mobility: ?Bed Mobility ?Overal bed mobility: Needs Assistance ?Bed Mobility: Supine to Sit, Sit to Supine ?Supine to sit: Supervision ?Sit to supine: Mod assist ?General bed mobility comments: no cues needed ?Transfers ?Overall transfer level: Needs assistance ?Equipment used: Rolling walker (2 wheels) ?Transfers: Sit to/from Stand ?Sit to Stand: Mod assist (from lowered EOB) ?General transfer comment: cues for hand placement and sequencing, repeated x 6 reps ?Ambulation/Gait ?Ambulation/Gait assistance: Min assist ?Gait Distance (Feet): 50 Feet ?Assistive device: Rolling walker (2 wheels) ?Gait Pattern/deviations: Step-through pattern, Decreased stride length ?General Gait Details: slightly unsteady gait; cues for sequencing, and proper use of RW ?Gait velocity: decreased ?  ?ADL: ?ADL ?Overall ADL's :  Needs assistance/impaired ?Eating/Feeding: Set up, Sitting ?Grooming: Set up, Sitting ?Upper Body Bathing: Minimal assistance, Sitting ?Lower Body Bathing: Moderate assistance, Sit to/from stand ?Upper Body Dressing : Set up, Sitting ?Lower Body Dressing: Moderate assistance, Sit to/from stand ?Toilet Transfer: Moderate assistance, BSC/3in1, Rolling walker (2 wheels) ?Toilet  Transfer Details (indicate cue type and reason): simulated stand pivot ?Toileting- Clothing Manipulation and Hygiene: Moderate assistance, Sit to/from stand ?Toileting - Clothing Manipulation Details (indicate cue typ

## 2021-06-18 LAB — CBC WITH DIFFERENTIAL/PLATELET
Abs Immature Granulocytes: 0.51 10*3/uL — ABNORMAL HIGH (ref 0.00–0.07)
Basophils Absolute: 0 10*3/uL (ref 0.0–0.1)
Basophils Relative: 0 %
Eosinophils Absolute: 0.2 10*3/uL (ref 0.0–0.5)
Eosinophils Relative: 1 %
HCT: 21.9 % — ABNORMAL LOW (ref 39.0–52.0)
Hemoglobin: 7.8 g/dL — ABNORMAL LOW (ref 13.0–17.0)
Immature Granulocytes: 2 %
Lymphocytes Relative: 8 %
Lymphs Abs: 1.6 10*3/uL (ref 0.7–4.0)
MCH: 38 pg — ABNORMAL HIGH (ref 26.0–34.0)
MCHC: 35.6 g/dL (ref 30.0–36.0)
MCV: 106.8 fL — ABNORMAL HIGH (ref 80.0–100.0)
Monocytes Absolute: 2.1 10*3/uL — ABNORMAL HIGH (ref 0.1–1.0)
Monocytes Relative: 10 %
Neutro Abs: 16.6 10*3/uL — ABNORMAL HIGH (ref 1.7–7.7)
Neutrophils Relative %: 79 %
Platelets: 76 10*3/uL — ABNORMAL LOW (ref 150–400)
RBC: 2.05 MIL/uL — ABNORMAL LOW (ref 4.22–5.81)
Smear Review: DECREASED
WBC: 21 10*3/uL — ABNORMAL HIGH (ref 4.0–10.5)
nRBC: 0.1 % (ref 0.0–0.2)

## 2021-06-18 LAB — GLUCOSE, CAPILLARY
Glucose-Capillary: 122 mg/dL — ABNORMAL HIGH (ref 70–99)
Glucose-Capillary: 107 mg/dL — ABNORMAL HIGH (ref 70–99)
Glucose-Capillary: 158 mg/dL — ABNORMAL HIGH (ref 70–99)
Glucose-Capillary: 180 mg/dL — ABNORMAL HIGH (ref 70–99)

## 2021-06-18 LAB — COMPREHENSIVE METABOLIC PANEL
ALT: 128 U/L — ABNORMAL HIGH (ref 0–44)
AST: 145 U/L — ABNORMAL HIGH (ref 15–41)
Albumin: 2.1 g/dL — ABNORMAL LOW (ref 3.5–5.0)
Alkaline Phosphatase: 119 U/L (ref 38–126)
Anion gap: 7 (ref 5–15)
BUN: 40 mg/dL — ABNORMAL HIGH (ref 6–20)
CO2: 27 mmol/L (ref 22–32)
Calcium: 8.6 mg/dL — ABNORMAL LOW (ref 8.9–10.3)
Chloride: 96 mmol/L — ABNORMAL LOW (ref 98–111)
Creatinine, Ser: 1.17 mg/dL (ref 0.61–1.24)
GFR, Estimated: >60 mL/min (ref >60)
Glucose, Bld: 116 mg/dL — ABNORMAL HIGH (ref 70–99)
Potassium: 4 mmol/L (ref 3.5–5.1)
Sodium: 130 mmol/L — ABNORMAL LOW (ref 135–145)
Total Bilirubin: 20.4 mg/dL (ref 0.3–1.2)
Total Protein: 5.2 g/dL — ABNORMAL LOW (ref 6.5–8.1)

## 2021-06-18 LAB — MAGNESIUM: Magnesium: 2.1 mg/dL (ref 1.7–2.4)

## 2021-06-18 LAB — URINALYSIS, ROUTINE W REFLEX MICROSCOPIC
Glucose, UA: NEGATIVE mg/dL
Hgb urine dipstick: NEGATIVE
Ketones, ur: NEGATIVE mg/dL
Leukocytes,Ua: NEGATIVE
Nitrite: NEGATIVE
Protein, ur: NEGATIVE mg/dL
Specific Gravity, Urine: 1.014 (ref 1.005–1.030)
pH: 6 (ref 5.0–8.0)

## 2021-06-18 LAB — PROCALCITONIN: Procalcitonin: 0.33 ng/mL

## 2021-06-18 LAB — VITAMIN D 25 HYDROXY (VIT D DEFICIENCY, FRACTURES): Vit D, 25-Hydroxy: 71.18 ng/mL (ref 30–100)

## 2021-06-18 MED ORDER — GERHARDT'S BUTT CREAM
TOPICAL_CREAM | Freq: Four times a day (QID) | CUTANEOUS | Status: DC
  Filled 2021-06-18: qty 1

## 2021-06-18 NOTE — Plan of Care (Signed)
?  Problem: RH Balance ?Goal: LTG Patient will maintain dynamic standing balance (PT) ?Description: LTG:  Patient will maintain dynamic standing balance with assistance during mobility activities (PT) ?Flowsheets (Taken 06/18/2021 1529) ?LTG: Pt will maintain dynamic standing balance during mobility activities with:: Contact Guard/Touching assist ?  ?Problem: Sit to Stand ?Goal: LTG:  Patient will perform sit to stand with assistance level (PT) ?Description: LTG:  Patient will perform sit to stand with assistance level (PT) ?Flowsheets (Taken 06/18/2021 1529) ?LTG: PT will perform sit to stand in preparation for functional mobility with assistance level: Contact Guard/Touching assist ?  ?Problem: RH Bed Mobility ?Goal: LTG Patient will perform bed mobility with assist (PT) ?Description: LTG: Patient will perform bed mobility with assistance, with/without cues (PT). ?Flowsheets (Taken 06/18/2021 1529) ?LTG: Pt will perform bed mobility with assistance level of: Supervision/Verbal cueing ?  ?Problem: RH Bed to Chair Transfers ?Goal: LTG Patient will perform bed/chair transfers w/assist (PT) ?Description: LTG: Patient will perform bed to chair transfers with assistance (PT). ?Flowsheets (Taken 06/18/2021 1529) ?LTG: Pt will perform Bed to Chair Transfers with assistance level: Contact Guard/Touching assist ?  ?Problem: RH Car Transfers ?Goal: LTG Patient will perform car transfers with assist (PT) ?Description: LTG: Patient will perform car transfers with assistance (PT). ?Flowsheets (Taken 06/18/2021 1529) ?LTG: Pt will perform car transfers with assist:: Contact Guard/Touching assist ?  ?Problem: RH Ambulation ?Goal: LTG Patient will ambulate in controlled environment (PT) ?Description: LTG: Patient will ambulate in a controlled environment, # of feet with assistance (PT). ?Flowsheets (Taken 06/18/2021 1529) ?LTG: Pt will ambulate in controlled environ  assist needed:: Contact Guard/Touching assist ?LTG: Ambulation distance  in controlled environment: 1f ?Goal: LTG Patient will ambulate in home environment (PT) ?Description: LTG: Patient will ambulate in home environment, # of feet with assistance (PT). ?Flowsheets (Taken 06/18/2021 1529) ?LTG: Pt will ambulate in home environ  assist needed:: Contact Guard/Touching assist ?LTG: Ambulation distance in home environment: 262f?  ?

## 2021-06-18 NOTE — Progress Notes (Signed)
Inpatient Rehabilitation Care Coordinator ?Assessment and Plan ?Patient Details  ?Name: Eric Dennis ?MRN: [id number] ?Date of Birth: Nov 05, 1965 ? ?Today's Date: 06/18/2021 ? ?Hospital Problems: Principal Problem: ?  Cirrhosis of liver ([place]) ?Active Problems: ?  Decompensated hepatic cirrhosis ([place]) ? ?Past Medical History:  ?Past Medical History:  ?Diagnosis Date  ? Elevated liver function tests   ? High cholesterol   ? Weight loss   ? ?Past Surgical History:  ?Past Surgical History:  ?Procedure Laterality Date  ? COLONOSCOPY N/A 03/14/2014  ? Procedure: COLONOSCOPY;  Surgeon: Rogene Houston, MD;  [facility];  Service: Endoscopy;  Laterality: N/A;  730 - moved to 1/6 @ 12:00 - Ann notified pt  ? COLONOSCOPY WITH PROPOFOL N/A 11/04/2020  ? Procedure: COLONOSCOPY WITH PROPOFOL;  Surgeon: Ronnette Juniper, MD;  Location: [facility];  Service: Gastroenterology;  Laterality: N/A;  ? ESOPHAGEAL BANDING  11/04/2020  ? Procedure: ESOPHAGEAL BANDING;  Surgeon: Ronnette Juniper, MD;  Location: [facility];  Service: Gastroenterology;;  ? ESOPHAGOGASTRODUODENOSCOPY N/A 05/13/2021  ? Procedure: ESOPHAGOGASTRODUODENOSCOPY (EGD);  Surgeon: Ronnette Juniper, MD;  Location: [facility];  Service: Gastroenterology;  Laterality: N/A;  ? ESOPHAGOGASTRODUODENOSCOPY (EGD) WITH PROPOFOL N/A 11/04/2020  ? Procedure: ESOPHAGOGASTRODUODENOSCOPY (EGD) WITH PROPOFOL;  Surgeon: Ronnette Juniper, MD;  [facility];  Service: Gastroenterology;  Laterality: N/A;  ? IR PARACENTESIS  07/19/2020  ? NO PAST SURGERIES    ? POLYPECTOMY  11/04/2020  ? Procedure: POLYPECTOMY;  Surgeon: Ronnette Juniper, MD;  Location: [facility];  Service: Gastroenterology;;  ? ?Social History:  reports that he has been smoking cigarettes. He has a 5.00 pack-year smoking history. He has never used smokeless tobacco. He reports current alcohol use of about 14.0 standard drinks per week. He reports that he does not use drugs. ? ?Family / Support Systems ?Marital Status:  Married ?Patient Roles: Spouse, Parent ?Spouse/Significant Other: Saundria [phone] ?Children: Two grown son's ?Other Supports: barbara-Mom [phone] ?Anticipated Caregiver: Wife ?Ability/Limitations of Caregiver: Wife does work but is trying to become pt's caregiver and be paid ?Caregiver Availability: 24/7 ?Family Dynamics: Close with family and son's. Son's check on parents and make sure have what they need. Both pt's mom and wife's mom are involved ? ?Social History ?Preferred language: English ?Religion: Baptist ?Cultural Background: No issues ?Education: HS ?Health Literacy - How often do you need to have someone help you when you read instructions, pamphlets, or other written material from your doctor or pharmacy?: Never ?Writes: Yes ?Employment Status: Disabled ?Legal History/Current Legal Issues: No issues ?Guardian/Conservator: none-according to MD pt is capable of making his own decisions while here  ? ?Abuse/Neglect ?Abuse/Neglect Assessment Can Be Completed: Yes ?Physical Abuse: Denies ?Verbal Abuse: Denies ?Sexual Abuse: Denies ?Exploitation of patient/patient's resources: Denies ?Self-Neglect: Denies ? ?Patient response to: ?Social Isolation - How often do you feel lonely or isolated from those around you?: Sometimes ? ?Emotional Status ?Pt's affect, behavior and adjustment status: Pt is wanting to go home and not be here. He feels his wife can manage him but will participate in therapies but hopes it will be short. he was independent prior to admission and hopes to get back to this level with a rolling walker ?Recent Psychosocial Issues: other health issues ?Psychiatric History: No history would benefit from seeing neuro-psych due to substance abuse issues and poor prognosis ?Substance Abuse History: ETOH and tobacco aware of the risks and feels will do what he wants to do ? ?Patient / Family Perceptions, Expectations & Goals ?Pt/Family understanding of  illness & functional limitations: Pt has a  basic understanding and wife is here daily and does talk with the MD and feels has a good understanding of his health issues and plan moving forward. She is hopeful she can become his caregiver and will not need to work ?Premorbid pt/family roles/activities: Husband, father, retiree, son, etc ?Anticipated changes in roles/activities/participation: resume ?Pt/family expectations/goals: Pt states: " I want to go home."  Wife states: " I need him to get stronger while here." ? ?Community Resources ?Community Agencies: None ?Premorbid Home Care/DME Agencies: Other (Comment) (rw and tub seat) ?Transportation available at discharge: wife ?Is the patient able to respond to transportation needs?: Yes ?In the past 12 months, has lack of transportation kept you from medical appointments or from getting medications?: Yes ?In the past 12 months, has lack of transportation kept you from meetings, work, or from getting things needed for daily living?: Yes ?Resource referrals recommended: Neuropsychology ? ?Discharge Planning ?Living Arrangements: Spouse/significant other ?Support Systems: Spouse/significant other, Children, Parent, Friends/neighbors ?Type of Residence: Private residence ?Insurance Resources: [employer] (specify county) ?Financial Resources: SSI, Family Support ?Financial Screen Referred: No ?Living Expenses: Own ?Money Management: Spouse ?Does the patient have any problems obtaining your medications?: No ?Home Management: wife ?Patient/Family Preliminary Plans: Return home with wife who is working on being able to be his caregiver and get paid by medicaid. She will need to go through [facility] and speak with his Medicaid worker regarding this. Currently on leave from work. Aware team will recommend 24/7 supervision for home ?Care Coordinator Barriers to Discharge: Insurance for SNF coverage, Medication compliance, Behavior ?Care Coordinator Anticipated Follow Up Needs: HH/OP ? ?Clinical Impression ?Pleasant  gentleman who wants to go home ASAP. Wife is present in his room and supportive and needs him to be stronger before going home. Palliative Care was involved and will follow at discharge. Will await evaluations and work on discharge needs. Will have neuro-psych see while here ? ?Elease Hashimoto ?06/18/2021, 8:57 AM ? ?  ?

## 2021-06-18 NOTE — Progress Notes (Signed)
Patient ID: Eric Dennis, male   DOB: 1965-08-18, 56 y.o.   MRN: [id number] ? ?Pt arrived to 4W12 per bed. Pt educated on policies and oriented to rehab. Pt in agreement. Skin assessment complete, vitals obtained. No complications noted. ?Sheela Stack, LPN  ?

## 2021-06-18 NOTE — Evaluation (Signed)
Occupational Therapy Assessment and Plan ? ?Patient Details  ?Name: Eric Dennis ?MRN: [id number] ?Date of Birth: 06-28-1965 ? ?OT Diagnosis: abnormal posture, cognitive deficits, and muscle weakness (generalized), impaired coordination, unsteady on feet ?Rehab Potential: Rehab Potential (ACUTE ONLY): Fair ?ELOS: 5-7 days  ? ?Today's Date: 06/18/2021 ?OT Individual Time: 7846-9629 ?OT Individual Time Calculation (min): 55 min    ? ?Hospital Problem: Principal Problem: ?  Cirrhosis of liver ([place]) ?Active Problems: ?  Decompensated hepatic cirrhosis ([place]) ? ? ?Past Medical History:  ?Past Medical History:  ?Diagnosis Date  ? Elevated liver function tests   ? High cholesterol   ? Weight loss   ? ?Past Surgical History:  ?Past Surgical History:  ?Procedure Laterality Date  ? COLONOSCOPY N/A 03/14/2014  ? Procedure: COLONOSCOPY;  Surgeon: Rogene Houston, MD;  [facility];  Service: Endoscopy;  Laterality: N/A;  730 - moved to 1/6 @ 12:00 - Ann notified pt  ? COLONOSCOPY WITH PROPOFOL N/A 11/04/2020  ? Procedure: COLONOSCOPY WITH PROPOFOL;  Surgeon: Ronnette Juniper, MD;  [facility];  Service: Gastroenterology;  Laterality: N/A;  ? ESOPHAGEAL BANDING  11/04/2020  ? Procedure: ESOPHAGEAL BANDING;  Surgeon: Ronnette Juniper, MD;  Location: [facility];  Service: Gastroenterology;;  ? ESOPHAGOGASTRODUODENOSCOPY N/A 05/13/2021  ? Procedure: ESOPHAGOGASTRODUODENOSCOPY (EGD);  Surgeon: Ronnette Juniper, MD;  Location: [facility];  Service: Gastroenterology;  Laterality: N/A;  ? ESOPHAGOGASTRODUODENOSCOPY (EGD) WITH PROPOFOL N/A 11/04/2020  ? Procedure: ESOPHAGOGASTRODUODENOSCOPY (EGD) WITH PROPOFOL;  Surgeon: Ronnette Juniper, MD;  [facility];  Service: Gastroenterology;  Laterality: N/A;  ? IR PARACENTESIS  07/19/2020  ? NO PAST SURGERIES    ? POLYPECTOMY  11/04/2020  ? Procedure: POLYPECTOMY;  Surgeon: Ronnette Juniper, MD;  Location: [facility];  Service: Gastroenterology;;  ? ? ?Assessment & Plan ?Clinical  Impression: Eric Dennis is a 56 year old male who was admitted on 06/08/2021 secondary to decompensated alcoholic cirrhosis, hematemesis and altered mental status.  He required admission to intensive care/critical care medicine, intubation and mechanical ventilation.  Imaging was consistent with cirrhosis and portal hypertension and cholelithiasis.  No ascites present.  Gastroenterology consultation obtained on 4/30 by Dr. Michail Sermon. Supportive care with Protonix and octreotide and monitoring H&H.  Completed antibiotic treatment for SBP.  Nephrology consultation on 4/3 due to hypovolemic shock, elevated creatinine to 3.14 and borderline oliguria.  Appropriate treatment for hepatic renal syndrome including steroids and albumin as well as Levophed ongoing.  Plan for methylprednisolone IV for 4 weeks of therapy.  Transfused 1 unit of packed red blood cells on 4/4 for hemoglobin of 6.9.  Spontaneous breathing trials initiated 4/5.  Diabetes coordinator consulted on 4/5 and recommended NovoLog 2 units every 4 hours while on steroids and tube feeds.  Core track placed to left nare 4/5.  Patient showed signs of recovery from acute kidney injury with improving creatinine and urine output.  Extubated 4/5.  Mental status clear.  Palliative care consultation on 4/7.  Transition to hospitalist service 4/7.  Follow-up palliative care consultation on 4/8.  Patient remains full code.  A MOST and advanced directive provided to patient and spouse.  History urology signs off with recommendation to follow-up with Dr. Deno Etienne in 3 to 4 weeks after discharge from rehab.  Diet advanced to low-sodium diabetic diet.  Now on oral prednisone 40 mg daily. Not considered candidate for liver transplantation. The patient requires inpatient medicine and rehabilitation evaluations and services for ongoing dysfunction secondary to functional deficits related to alcoholic cirrhosis. Currently has bilateral  lower extremity edema.  Patient transferred to  [facility]IR on 06/17/2021 .   ? ?Patient currently requires min with basic self-care skills secondary to muscle weakness, decreased cardiorespiratoy endurance, decreased coordination, decreased initiation, decreased attention, decreased awareness, decreased problem solving, decreased safety awareness, decreased memory, and delayed processing, and decreased sitting balance, decreased standing balance, decreased postural control, and decreased balance strategies.  Prior to hospitalization, patient could complete ADLs with min assist.   ? ?Patient will benefit from skilled intervention to increase independence with basic self-care skills prior to discharge home with care partner.  Anticipate patient will require 24 hour supervision and follow up home health. ? ?OT - End of Session ?Activity Tolerance: Tolerates 10 - 20 min activity with multiple rests ?Endurance Deficit: Yes ?Endurance Deficit Description: Pt requesting back to bed repeatedly during basic EOB self care; limited standing tolerance as well with self initiation of seated rest breaks after ~30 seconds due to fatigue ?OT Assessment ?Rehab Potential (ACUTE ONLY): Fair ?OT Barriers to Discharge: Incontinence;Medication compliance;Behavior ?OT Barriers to Discharge Comments: Pt apathetic towards therapy ?OT Patient demonstrates impairments in the following area(s): Balance;Behavior;Safety;Cognition;Edema;Skin Integrity;Endurance;Motor ?OT Basic ADL's Functional Problem(s): Grooming;Bathing;Dressing;Toileting ?OT Transfers Functional Problem(s): Toilet;Tub/Shower ?OT Additional Impairment(s): None ?OT Plan ?OT Intensity: Minimum of 1-2 x/day, 45 to 90 minutes ?OT Frequency: 5 out of 7 days ?OT Duration/Estimated Length of Stay: 5-7 days ?OT Treatment/Interventions: Balance/vestibular training;Discharge planning;Self Care/advanced ADL retraining;Therapeutic Activities;UE/LE Coordination activities;Pain management;Cognitive remediation/compensation;Disease  mangement/prevention;Functional mobility training;Patient/family education;Skin care/wound managment;Therapeutic Exercise;DME/adaptive equipment instruction;Neuromuscular re-education;Splinting/orthotics;UE/LE Strength taining/ROM;Wheelchair propulsion/positioning;Psychosocial support;Community reintegration ?OT Basic Self-Care Anticipated Outcome(s): CGA- supervision ?OT Toileting Anticipated Outcome(s): supervision ?OT Bathroom Transfers Anticipated Outcome(s): CGA ?OT Recommendation ?Recommendations for Other Services: Neuropsych consult ?Patient destination: Home ?Follow Up Recommendations: 24 hour supervision/assistance ?Equipment Recommended: To be determined ? ? ?OT Evaluation ?Precautions/Restrictions  ?Precautions ?Precautions: Fall;Other (comment) ?Precaution Comments: multiple falls at home ?Restrictions ?Weight Bearing Restrictions: No ?General ?Chart Reviewed: Yes ?Pain ?Pain Assessment ?Pain Scale: 0-10 ?Pain Score: 0-No pain ?Home Living/Prior Functioning ?Home Living ?Living Arrangements: Spouse/significant other ?Available Help at Discharge: Family, Available 24 hours/day ?Type of Home: House ?Home Access: Level entry ?Home Layout: One level ?Bathroom Shower/Tub: Tub/shower unit ? Lives With: Spouse ?IADL History ?Education: Graduated high school ?Prior Function ?Level of Independence: Needs assistance with ADLs, Needs assistance with gait, Needs assistance with tranfers, Needs assistance with homemaking ?Driving: No ?Vocation: Unemployed ?Vision ?Baseline Vision/History: 0 No visual deficits ?Ability to See in Adequate Light: 0 Adequate ?Patient Visual Report: No change from baseline ?Vision Assessment?: No apparent visual deficits ?Perception  ?Perception: Within Functional Limits ?Praxis ?Praxis: Intact ?Cognition ?Cognition ?Overall Cognitive Status: History of cognitive impairments - at baseline ?Arousal/Alertness: Lethargic ?Orientation Level: Person;Place;Situation ?Person: Oriented ?Place:  Oriented ?Situation: Oriented ?Memory: Impaired ?Memory Impairment: Retrieval deficit;Decreased recall of new information;Decreased short term memory;Prospective memory ?Decreased Short Term Memory: Verbal basic;Functional b

## 2021-06-18 NOTE — Evaluation (Addendum)
Speech Language Pathology Assessment and Plan ? ?Patient Details  ?Name: Eric Dennis ?MRN: [id number] ?Date of Birth: January 27, 1966 ? ?SLP Diagnosis: Cognitive Impairments;Dysarthria  ?Rehab Potential: Fair ?ELOS: 5 days  ? ?Today's Date: 06/18/2021 ?SLP Individual Time: 7106-2694 ?SLP Individual Time Calculation (min): 60 min ? ?Hospital Problem: Principal Problem: ?  Cirrhosis of liver ([place]) ?Active Problems: ?  Decompensated hepatic cirrhosis ([place]) ? ?Past Medical History:  ?Past Medical History:  ?Diagnosis Date  ? Elevated liver function tests   ? High cholesterol   ? Weight loss   ? ?Past Surgical History:  ?Past Surgical History:  ?Procedure Laterality Date  ? COLONOSCOPY N/A 03/14/2014  ? Procedure: COLONOSCOPY;  Surgeon: Rogene Houston, MD;  [facility];  Service: Endoscopy;  Laterality: N/A;  730 - moved to 1/6 @ 12:00 - Ann notified pt  ? COLONOSCOPY WITH PROPOFOL N/A 11/04/2020  ? Procedure: COLONOSCOPY WITH PROPOFOL;  Surgeon: Ronnette Juniper, MD;  [facility]: [facility];  Service: Gastroenterology;  Laterality: N/A;  ? ESOPHAGEAL BANDING  11/04/2020  ? Procedure: ESOPHAGEAL BANDING;  Surgeon: Ronnette Juniper, MD;  Location: [facility];  Service: Gastroenterology;;  ? ESOPHAGOGASTRODUODENOSCOPY N/A 05/13/2021  ? Procedure: ESOPHAGOGASTRODUODENOSCOPY (EGD);  Surgeon: Ronnette Juniper, MD;  Location: [facility];  Service: Gastroenterology;  Laterality: N/A;  ? ESOPHAGOGASTRODUODENOSCOPY (EGD) WITH PROPOFOL N/A 11/04/2020  ? Procedure: ESOPHAGOGASTRODUODENOSCOPY (EGD) WITH PROPOFOL;  Surgeon: Ronnette Juniper, MD;  [facility];  Service: Gastroenterology;  Laterality: N/A;  ? IR PARACENTESIS  07/19/2020  ? NO PAST SURGERIES    ? POLYPECTOMY  11/04/2020  ? Procedure: POLYPECTOMY;  Surgeon: Ronnette Juniper, MD;  Location: [facility];  Service: Gastroenterology;;  ? ? ?Assessment / Plan / Recommendation ?Clinical Impression HPI: Pt is a 56 y/o male who presented to the ED 4/2 with AMS and family reports of half  a week hx of worsening AMS with concomitant low PO intake. Pt intubated due to difficulty with airway protection and incisor noted adjacent to the ETT and vocal folds during larynx visualization' tooth removed. ETT 4/2-4/5. Pt found to have AKI and acute hepatic encephalopathy. GI consulted due to dark stools in rectal tube plan to defer EGD unless signso f active bleeding develop. PMH EtOH abuse, alcoholic cirrhosis,  prior esophageal varices s/p banding. MBS 08/28/20: mild oropharyngeal and cervical esophageal dysphagia. Minimal decreased coordination of swallow and swallow triggering at pyriform sinus allowing minimal laryngeal penetration of thin x2 that did not clear larynx. A dysphagia 2 diet with thin liquids recommended and pt was then advanced to regular texture solids and thin liquids on 08/30/20. Seen for BSE on 06/12/2021, at which time D3 textures and thin liquid via cup was recommended. Pt quickly advanced to regular textures with thin liquids via cup and straw with liberalization of aspiration precautions. CIR recommended to maximize pt's independence and decrease caregiver burden in the setting of hepatic encephalopathy.  ? ?Pt seen, this AM, for ST evaluation of cognitive-linguistic skills. Pt encountered lethargic, though awake and lying semi-reclined in bed. Wife present. No c/o pain. Agreeable to evaluation following education on reason for referral. Behaviorally, pt was participatory, though exhibited decreased motivation overall. ? ?Per formal assessment findings via the COGNISTAT and skilled + informal findings, pt presents with at least mild impairment in mental math calculations and orientation, moderate deficits in the cognitive domains of attention, and moderate-severe impairment in short-term memory, working memory, and executive functioning to include decreased mental flexibility, planning, initiation, and sequencing. Speech output c/b as mildly  dysarthric in the setting of imprecise  articulation, which may have been further impacted by fatigue. Receptive language appeared negatively impacted by diminished auditory processing and working memory for basic and complex verbal information. Expressive language was limited to expression of basic wants/needs with cueing; required Max to Total A for divergent naming of likes and dislikes. Answered yes/no questions appropriately.  ? ?Prior to admission, pt was dependent, on his spouse, for the completion of complex iADL tasks to include medication + financial management, cooking, appointment scheduling, recall of daily events, driving, and heavy cleaning. Pt is unemployed and enjoys watching TV, talking on the phone, and YouTube videos during the day. He reports he will intermittently complete light housework and meal prep. Per pt and pt's spouse report, they report acute on chronic changes in cognitive skills and speech intelligibility with this hospital admission, though report improvements daily.  ? ?Pt followed by SLP in acute care for swallow dysfunction in the setting of post-extubation vocal changes. Prior to CIR admission, pt tolerating regular diet and thin liquids with SLP eventually signing off. SLP briefly observed pt with consecutive sips of water via straw and hard solid texture (e.g. bacon) without evidence concerning for oropharyngeal dysphagia.Vocal quality appeared clear/dry and grossly WFL. Independent implementation of general aspiration precautions. No swallowing needs identified. ? ?Given assessment findings, pt would likely benefit from a short-course of skilled ST intervention to provide pt and family education re: speech intelligibility strategies and sequencing of ADL tasks to maximize independence with activities completed prior to admission, and mitigate further caregiver burden. Results and recommendations were reviewed with pt and pt's wife who appear amenable to proposed plan. Pt will require 24/7 supervision, from a  cognitive standpoint, upon discharge. Will follow during CIR admission. ?  ?Skilled Therapeutic Interventions          Cognitive-linguistic evaluation completed via COGNISTAT, portions of the ALFA, and informal measures. ?  ?SLP Assessment ? Patient will need skilled Speech Lanaguage Pathology Services during CIR admission  ?  ?Recommendations ? SLP Diet Recommendations: Age appropriate regular solids;Thin ?Liquid Administration via: Cup;Straw ?Medication Administration: Whole meds with liquid ?Supervision: Patient able to self feed;Intermittent supervision to cue for compensatory strategies ?Compensations: Slow rate;Small sips/bites ?Postural Changes and/or Swallow Maneuvers: Seated upright 90 degrees;Upright 30-60 min after meal ?Oral Care Recommendations: Oral care BID ?Patient destination: Home ?Follow up Recommendations: 24 hour supervision/assistance ?Equipment Recommended: None recommended by SLP  ?  ?SLP Frequency 1 to 3 out of 7 days   ?SLP Duration ? ?SLP Intensity ? ?SLP Treatment/Interventions 5 days ? ?Minumum of 1-2 x/day, 30 to 90 minutes ? ?Cognitive remediation/compensation;Functional tasks;Patient/family education   ? ?Pain ?Pain Assessment ?Pain Scale: 0-10 ?Pain Score: 0-No pain ? ?Prior Functioning ?Cognitive/Linguistic Baseline: Baseline deficits ?Baseline deficit details: Pt reports difficultly with cognitive skills at baseline, requiring full assistance from his spouse for medication + financial management, scheduling appointments, recall of appointments, cooking, and driving. Reports acute exacerbation with this hospital course. ?Type of Home: House ? Lives With: Spouse ?Available Help at Discharge: Family;Available 24 hours/day ?Education: Graduated high school ?Vocation: Unemployed ? ?SLP Evaluation ?Cognition ?Overall Cognitive Status: History of cognitive impairments - at baseline ?Arousal/Alertness: Lethargic ?Orientation Level: Oriented to person;Oriented to place;Oriented to  situation;Disoriented to time (COGNISTAT orientation subtest score: 8/12 - mild impairment) ?Year: 2023 ?Month: April ?Day of Week: Incorrect ?Attention: Focused;Sustained;Selective (COGNISTAT attention subtest score: 3/8

## 2021-06-18 NOTE — Plan of Care (Signed)
?  Problem: RH Grooming ?Goal: LTG Patient will perform grooming w/assist,cues/equip (OT) ?Description: LTG: Patient will perform grooming with assist, with/without cues using equipment (OT) ?Flowsheets (Taken 06/18/2021 1244) ?LTG: Pt will perform grooming with assistance level of: Set up assist  ?  ?Problem: RH Bathing ?Goal: LTG Patient will bathe all body parts with assist levels (OT) ?Description: LTG: Patient will bathe all body parts with assist levels (OT) ?Flowsheets (Taken 06/18/2021 1244) ?LTG: Pt will perform bathing with assistance level/cueing: Contact Guard/Touching assist ?LTG: Position pt will perform bathing: Shower ?  ?Problem: RH Dressing ?Goal: LTG Patient will perform upper body dressing (OT) ?Description: LTG Patient will perform upper body dressing with assist, with/without cues (OT). ?Flowsheets (Taken 06/18/2021 1244) ?LTG: Pt will perform upper body dressing with assistance level of: Supervision/Verbal cueing ?Goal: LTG Patient will perform lower body dressing w/assist (OT) ?Description: LTG: Patient will perform lower body dressing with assist, with/without cues in positioning using equipment (OT) ?Flowsheets (Taken 06/18/2021 1244) ?LTG: Pt will perform lower body dressing with assistance level of: Contact Guard/Touching assist ?  ?Problem: RH Toileting ?Goal: LTG Patient will perform toileting task (3/3 steps) with assistance level (OT) ?Description: LTG: Patient will perform toileting task (3/3 steps) with assistance level (OT)  ?Flowsheets (Taken 06/18/2021 1244) ?LTG: Pt will perform toileting task (3/3 steps) with assistance level: Supervision/Verbal cueing ?  ?Problem: RH Toilet Transfers ?Goal: LTG Patient will perform toilet transfers w/assist (OT) ?Description: LTG: Patient will perform toilet transfers with assist, with/without cues using equipment (OT) ?Flowsheets (Taken 06/18/2021 1244) ?LTG: Pt will perform toilet transfers with assistance level of: Contact Guard/Touching assist ?   ?Problem: RH Tub/Shower Transfers ?Goal: LTG Patient will perform tub/shower transfers w/assist (OT) ?Description: LTG: Patient will perform tub/shower transfers with assist, with/without cues using equipment (OT) ?Flowsheets (Taken 06/18/2021 1244) ?LTG: Pt will perform tub/shower stall transfers with assistance level of: Contact Guard/Touching assist ?LTG: Pt will perform tub/shower transfers from: Tub/shower combination ?  ?

## 2021-06-18 NOTE — Progress Notes (Signed)
Inpatient Rehabilitation Center ?Individual Statement of Services ? ?Patient Name:  Eric Dennis  ?Date:  06/18/2021 ? ?Welcome to the [facility].  Our goal is to provide you with an individualized program based on your diagnosis and situation, designed to meet your specific needs.  With this comprehensive rehabilitation program, you will be expected to participate in at least 3 hours of rehabilitation therapies Monday-Friday, with modified therapy programming on the weekends. ? ?Your rehabilitation program will include the following services:  Physical Therapy (PT), Occupational Therapy (OT), Speech Therapy (ST), 24 hour per day rehabilitation nursing, Therapeutic Recreaction (TR), Neuropsychology, Care Coordinator, Rehabilitation Medicine, Nutrition Services, and Pharmacy Services ? ?Weekly team conferences will be held on Wednesday to discuss your progress.  Your Inpatient Rehabilitation Care Coordinator will talk with you frequently to get your input and to update you on team discussions.  Team conferences with you and your family in attendance may also be held. ? ?Expected length of stay: 7 days  Overall anticipated outcome: Supervision-CGA level ? ?Depending on your progress and recovery, your program may change. Your Inpatient Rehabilitation Care Coordinator will coordinate services and will keep you informed of any changes. Your Inpatient Rehabilitation Care Coordinator's name and contact numbers are listed  below. ? ?The following services may also be recommended but are not provided by the [facility]:  ? ?Home Health Rehabiltiation Services ?Outpatient Rehabilitation Services ? ?  ?Arrangements will be made to provide these services after discharge if needed.  Arrangements include referral to agencies that provide these services. ? ?Your insurance has been verified to be:  [facility] ?Your primary doctor is:  [place] ? ?Pertinent information will be  shared with your doctor and your insurance company. ? ?Inpatient Rehabilitation Care Coordinator:  Ovidio Kin, [place] or (C) [phone] ? ?Information discussed with and copy given to patient by: Elease Hashimoto, 06/18/2021, 8:59 AM    ?

## 2021-06-18 NOTE — Progress Notes (Signed)
?                                                       PROGRESS NOTE ? ? ?Subjective/Complaints: ?Sleepy this morning, says he was woken early for vitals checks. Discussed placing order on door not to wake between 9pm and 5am and he is agreeable ?Jump in WBC- will do infectious workup to ensure not missing infection ? ?ROS: +insomnia ? ?Objective: ?  ?No results found. ?Recent Labs  ?  06/18/21 ?0541  ?WBC 21.0*  ?HGB 7.8*  ?HCT 21.9*  ?PLT 76*  ? ?Recent Labs  ?  06/16/21 ?0251 06/18/21 ?0541  ?NA 133* 130*  ?K 4.6 4.0  ?CL 99 96*  ?CO2 27 27  ?GLUCOSE 107* 116*  ?BUN 46* 40*  ?CREATININE 1.20 1.17  ?CALCIUM 8.7* 8.6*  ? ? ?Intake/Output Summary (Last 24 hours) at 06/18/2021 1108 ?Last data filed at 06/17/2021 2130 ?Gross per 24 hour  ?Intake 0 ml  ?Output 100 ml  ?Net -100 ml  ?  ? ?Pressure Injury 06/08/21 Elbow Left;Posterior DTI around elbow (Active)  ?06/08/21 1130  ?Location: Elbow  ?Location Orientation: Left;Posterior  ?Staging:   ?Wound Description (Comments): DTI around elbow  ?Present on Admission: Yes  ? ? ?Physical Exam: ?Vital Signs ?Blood pressure 121/61, pulse 87, temperature 98.3 ?F (36.8 ?C), temperature source Oral, resp. rate 15, height '6\' 4"'$  (1.93 m), weight 90.9 kg, SpO2 96 %. ?Gen: no distress, normal appearing, BMI 25.76, very fatigued ?HEENT: oral mucosa pink and moist, NCAT, jaundice ?Cardio: Reg rate ?Chest: normal effort, normal rate of breathing ?Abd: soft, non-distended ?Ext: Bilateral lower extremity edema ?Psych: pleasant, normal affect ?Skin: intact ?Neuro: Alert and oriented x3.  ?Musculoskeletal: 5/5 strength in upper extremities, 3/5 strength in lower extremities, limited by edema ? ? ?Assessment/Plan: ?1. Functional deficits which require 3+ hours per day of interdisciplinary therapy in a comprehensive inpatient rehab setting. ?Physiatrist is providing close team supervision and 24 hour management of active medical problems listed below. ?Physiatrist and rehab team continue to  assess barriers to discharge/monitor patient progress toward functional and medical goals ? ?Care Tool: ? ?Bathing ?   ?   ?   ?  ?  ?Bathing assist   ?  ?  ?Upper Body Dressing/Undressing ?Upper body dressing   ?What is the patient wearing?: [place] only ?   ?Upper body assist Assist Level: Total Assistance - Patient < 25% ?   ?Lower Body Dressing/Undressing ?Lower body dressing ? ? ?   ?What is the patient wearing?: Incontinence brief, Hospital gown only ? ?  ? ?Lower body assist Assist for lower body dressing: Total Assistance - Patient < 25% ?   ? ?Toileting ?Toileting    ?Toileting assist Assist for toileting: Total Assistance - Patient < 25% ?  ?  ?Transfers ?Chair/bed transfer ? ?Transfers assist ?   ? ?  ?  ?  ?Locomotion ?Ambulation ? ? ?Ambulation assist ? ?   ? ?  ?  ?   ? ?Walk 10 feet activity ? ? ?Assist ?   ? ?  ?   ? ?Walk 50 feet activity ? ? ?Assist   ? ?  ?   ? ? ?Walk 150 feet activity ? ? ?Assist   ? ?  ?  ?  ? ?  Walk 10 feet on uneven surface  ?activity ? ? ?Assist   ? ? ?  ?   ? ?Wheelchair ? ? ? ? ?Assist   ?  ?  ? ?  ?   ? ? ?Wheelchair 50 feet with 2 turns activity ? ? ? ?Assist ? ?  ?  ? ? ?   ? ?Wheelchair 150 feet activity  ? ? ? ?Assist ?   ? ? ?   ? ?Blood pressure 121/61, pulse 87, temperature 98.3 ?F (36.8 ?C), temperature source Oral, resp. rate 15, height '6\' 4"'$  (1.93 m), weight 90.9 kg, SpO2 96 %. ? ? ? Medical Problem List and Plan: ?1. Functional deficits secondary to functional deficits secondary to decompensated liver failure, alcoholic cirrhosis/encephalopathy ?            -patient may shower ?            -ELOS/Goals: 5 days S ?            -Admit to CIR ?2.  Antithrombotics: ?-DVT/anticoagulation:  Mechanical:  Antiembolism stockings, knee (TED hose) Bilateral lower extremities ?            -antiplatelet therapy: none ?3. Pain Management: Tylenol as needed ?4. Mood: LCSW to evaluate and provide emotional support ?            -antipsychotic agents: na ?5. Neuropsych: This  patient is capable of making decisions on his own behalf. ?6. Skin/Wound Care: Routine skin care checks ?7. Fluids/Electrolytes/Nutrition: Routine I's and O's and follow-up chemistries ?            -- Carb modified, 2 g sodium diet ?8: Cirrhosis/alcoholic hepatitis: ?            --continue prednisone to complete 28 day steroid treatment per GI ?            --continue rifaximin 550 mg BID ?            --continue lactulose 20 g BID (goal 3-4 BMs daily) ?            --continue thiamine supplementation ?9: Hyperglycemia secondary to steroids: ?            --CBGs q AC and q HS ?            --Semeglee 15 units daily ?            --SSI Novolog 0-9 units q AC ?            --meal coverage Novolog 3 units TID q AC ?10: Esophageal varices recently banded: ?            --continue Protonix 40 mg daily ?            --monitor for GI bleeding ?11: Thrombocytopenia: follow CBC ?--monitor for bleeding ?            --no pharmacologic DVT prophy ?AKI/HRS: improved; monitor BUN ?12: Hyponatremia: monitor ?            --GI recommending low sodium diet...? liberalize ?13: Elevated LFTs secondary to # 8: follow labs ?14: Hypoproteinemia  ?14: Leukocytosis: no fever or signs of infection: given bump in WBC on 4/12, will do infectious workup.  ?            --completed ceftriaxone for SBP ?15: Alcohol abuse: provide cessation counseling ?16: Palliative care consult follow-up as outpatient ?17: Anemia, multifactorial: no active bleeding currently; monitor; follow CBC ?18. Lower extremity edema:  place nursing order for leg elevation, compression garments, and ice prn ?19. Screening for vitamin D deficiency: vitamin D reviewed and stable ?20. Magnesium deficiency: magnesium reviewed and now stable ? ?LOS: ?1 days ?A FACE TO FACE EVALUATION WAS PERFORMED ? ?[name] [name] ?06/18/2021, 11:08 AM  ? ?  ?

## 2021-06-18 NOTE — Progress Notes (Signed)
Inpatient Rehabilitation  Patient information reviewed and entered into eRehab system by Aiden Helzer M. Kaisei Gilbo, M.A., CCC/SLP, PPS Coordinator.  Information including medical coding, functional ability and quality indicators will be reviewed and updated through discharge.    

## 2021-06-18 NOTE — Progress Notes (Signed)
Patient ID: Eric Dennis, male   DOB: Apr 29, 1965, 56 y.o.   MRN: [id number]  Spoke with wife via telephone to give her a team conference update her husband is doing well and at min-CGA level and team feels will be ready for discharge on 4/17. MD reports pt wants to go home and this should be long enough for him medically and to reach his goals. Wife to bring in papers to be completed by MD/PA for her to be his caregiver. ?

## 2021-06-18 NOTE — Evaluation (Signed)
Physical Therapy Assessment and Plan ? ?Patient Details  ?Name: Eric Dennis ?MRN: [id number] ?Date of Birth: [birth year] ? ?PT Diagnosis: Abnormal posture, Abnormality of gait, Cognitive deficits, Difficulty walking, Impaired cognition, and Muscle weakness ?Rehab Potential: Fair ?ELOS: 5-9 days  ? ?Today's Date: 06/18/2021 ?PT Individual Time: 4098-1191 ?PT Individual Time Calculation (min): 75 min   ? ?Hospital Problem: Principal Problem: ?  Cirrhosis of liver ([place]) ?Active Problems: ?  Decompensated hepatic cirrhosis ([place]) ? ? ?Past Medical History:  ?Past Medical History:  ?Diagnosis Date  ? Elevated liver function tests   ? High cholesterol   ? Weight loss   ? ?Past Surgical History:  ?Past Surgical History:  ?Procedure Laterality Date  ? COLONOSCOPY N/A 03/14/2014  ? Procedure: COLONOSCOPY;  Surgeon: Rogene Houston, MD;  [facility];  Service: Endoscopy;  Laterality: N/A;  730 - moved to 1/6 @ 12:00 - Ann notified pt  ? COLONOSCOPY WITH PROPOFOL N/A 11/04/2020  ? Procedure: COLONOSCOPY WITH PROPOFOL;  Surgeon: Ronnette Juniper, MD;  Location: [facility] END[facility];  Service: Gastroenterology;  Laterality: N/A;  ? ESOPHAGEAL BANDING  11/04/2020  ? Procedure: ESOPHAGEAL BANDING;  Surgeon: Ronnette Juniper, MD;  Location: [facility];  Service: Gastroenterology;;  ? ESOPHAGOGASTRODUODENOSCOPY N/A 05/13/2021  ? Procedure: ESOPHAGOGASTRODUODENOSCOPY (EGD);  Surgeon: Ronnette Juniper, MD;  Location: [facility];  Service: Gastroenterology;  Laterality: N/A;  ? ESOPHAGOGASTRODUODENOSCOPY (EGD) WITH PROPOFOL N/A 11/04/2020  ? Procedure: ESOPHAGOGASTRODUODENOSCOPY (EGD) WITH PROPOFOL;  Surgeon: Ronnette Juniper, MD;  Location: [facility];  Service: Gastroenterology;  Laterality: N/A;  ? IR PARACENTESIS  07/19/2020  ? NO PAST SURGERIES    ? POLYPECTOMY  11/04/2020  ? Procedure: POLYPECTOMY;  Surgeon: Ronnette Juniper, MD;  Location: [facility];  Service: Gastroenterology;;  ? ? ?Assessment & Plan ?Clinical Impression: Patient is a 56 year old  male who was admitted on 06/08/2021 secondary to decompensated alcoholic cirrhosis, hematemesis and altered mental status.  He required admission to intensive care/critical care medicine, intubation and mechanical ventilation.  Imaging was consistent with cirrhosis and portal hypertension and cholelithiasis.  No ascites present.  Gastroenterology consultation obtained on 4/30 by Dr. Michail Sermon. Supportive care with Protonix and octreotide and monitoring H&H.  Completed antibiotic treatment for SBP.  Nephrology consultation on 4/3 due to hypovolemic shock, elevated creatinine to 3.14 and borderline oliguria.  Appropriate treatment for hepatic renal syndrome including steroids and albumin as well as Levophed ongoing.  Plan for methylprednisolone IV for 4 weeks of therapy.  Transfused 1 unit of packed red blood cells on 4/4 for hemoglobin of 6.9.  Spontaneous breathing trials initiated 4/5.  Diabetes coordinator consulted on 4/5 and recommended NovoLog 2 units every 4 hours while on steroids and tube feeds.  Core track placed to left nare 4/5.  Patient showed signs of recovery from acute kidney injury with improving creatinine and urine output.  Extubated 4/5.  Mental status clear.  Palliative care consultation on 4/7.  Transition to hospitalist service 4/7.  Follow-up palliative care consultation on 4/8.  Patient remains full code.  A MOST and advanced directive provided to patient and spouse.  History urology signs off with recommendation to follow-up with Dr. Deno Etienne in 3 to 4 weeks after discharge from rehab.  Diet advanced to low-sodium diabetic diet.  Now on oral prednisone 40 mg daily. Not considered candidate for liver transplantation. The patient requires inpatient medicine and rehabilitation evaluations and services for ongoing dysfunction secondary to functional deficits related to alcoholic cirrhosis. Currently has bilateral lower extremity edema.  Patient  transferred to CIR on 06/17/2021 .  ? ?Patient currently  requires min with mobility secondary to muscle weakness, decreased cardiorespiratoy endurance, decreased motor planning, decreased motor planning, decreased initiation, decreased attention, decreased awareness, decreased problem solving, decreased safety awareness, decreased memory, and delayed processing, and decreased standing balance, decreased postural control, and decreased balance strategies.  Prior to hospitalization, patient was modified independent  with mobility and lived with Spouse in a House home.  Home access is  Level entry. ? ?Patient will benefit from skilled PT intervention to maximize safe functional mobility, minimize fall risk, and decrease caregiver burden for planned discharge home with 24 hour assist.  Anticipate patient will benefit from follow up [facility] at discharge. ? ?PT - End of Session ?Activity Tolerance: Tolerates < 10 min activity, no significant change in vital signs ?Endurance Deficit: Yes ?Endurance Deficit Description: Pt fatiguing quickly with basic functional mobility tasks such as standing, transfers, and short distance gait. Fatigue resulting in decreased safety awareness and knee buckling. ?PT Assessment ?Rehab Potential (ACUTE/IP ONLY): Poor ?PT Barriers to Discharge: Decreased caregiver support;Insurance for SNF coverage;Lack of/limited family support ?PT Patient demonstrates impairments in the following area(s): Balance;Endurance;Motor;Edema;Safety ?PT Transfers Functional Problem(s): Bed Mobility;Bed to Chair;Car ?PT Locomotion Functional Problem(s): Ambulation;Stairs;Wheelchair Mobility ?PT Plan ?PT Intensity: Minimum of 1-2 x/day ,45 to 90 minutes ?PT Frequency: 5 out of 7 days ?PT Duration Estimated Length of Stay: 5-9 days ?PT Treatment/Interventions: Ambulation/gait training;Balance/vestibular training;Cognitive remediation/compensation;Community reintegration;Discharge planning;Disease management/prevention;DME/adaptive equipment instruction;Functional mobility  training;Pain management;Neuromuscular re-education;Psychosocial support;Patient/family education;Skin care/wound management;Stair training;Therapeutic Activities;Therapeutic Exercise;UE/LE Strength taining/ROM;UE/LE Coordination activities;Visual/perceptual remediation/compensation;Wheelchair propulsion/positioning ?PT Transfers Anticipated Outcome(s): CGA with LRAD ?PT Locomotion Anticipated Outcome(s): minA with LRAD ?PT Recommendation ?Recommendations for Other Services: Neuropsych consult ?Follow Up Recommendations: Home health PT;24 hour supervision/assistance ?Patient destination: Home ?Equipment Recommended: To be determined ?Equipment Details: pt owns no DME ? ? ?PT Evaluation ?Precautions/Restrictions ?Precautions ?Precautions: Fall;Other (comment) ?Precaution Comments: multiple falls at home ?Restrictions ?Weight Bearing Restrictions: No ?Pain ?Pain Assessment ?Pain Scale: 0-10 ?Pain Score: 0-No pain ?Pain Interference ?Pain Interference ?Pain Effect on Sleep: 1. Rarely or not at all ?Pain Interference with Therapy Activities: 1. Rarely or not at all ?Pain Interference with Day-to-Day Activities: 1. Rarely or not at all ?Home Living/Prior Functioning ?Home Living ?Available Help at Discharge: Family;Available 24 hours/day ?Type of Home: House ?Home Access: Level entry ?Home Layout: One level ?Bathroom Shower/Tub: Tub/shower unit ?Additional Comments: Wife is trying to be able to work from home and his MIL will be moving in the house in ~2-3 weeks. ? Lives With: Spouse ?Prior Function ?Level of Independence: Needs assistance with ADLs;Needs assistance with gait;Needs assistance with tranfers;Needs assistance with homemaking ? Able to Take Stairs?: No ?Driving: No ?Vocation: Unemployed ?Vision/Perception  ?Vision - History ?Ability to See in Adequate Light: 0 Adequate ?Perception ?Perception: Within Functional Limits ?Praxis ?Praxis: Intact  ?Cognition ?Overall Cognitive Status: History of cognitive  impairments - at baseline ?Arousal/Alertness: Awake/alert ?Orientation Level: Oriented to person;Oriented to place;Oriented to situation;Disoriented to time ?Year: 2023 ?Month: April ?Day of Week: Incorrect ?[place]

## 2021-06-18 NOTE — Plan of Care (Signed)
?  Problem: RH Cognition - SLP ?Goal: RH LTG Patient will demonstrate orientation with cues ?Description:  LTG:  Patient will demonstrate orientation to person/place/time/situation with cues (SLP)   ?Flowsheets (Taken 06/18/2021 1347) ?LTG Patient will demonstrate orientation to: Time ?LTG: Patient will demonstrate orientation using cueing (SLP): Supervision ?  ?Problem: RH Expression Communication ?Goal: LTG Patient will increase speech intelligibility (SLP) ?Description: LTG: Patient will increase speech intelligibility at word/phrase/conversation level with cues, % of the time (SLP) ?Flowsheets (Taken 06/18/2021 1347) ?LTG: Patient will increase speech intelligibility (SLP): Supervision ?Level: Conversation level ?Percent of time patient will use intelligible speech: 85% ?  ?Problem: RH Problem Solving ?Goal: LTG Patient will demonstrate problem solving for (SLP) ?Description: LTG:  Patient will demonstrate problem solving for basic/complex daily situations with cues  (SLP) ?Flowsheets (Taken 06/18/2021 1347) ?LTG: Patient will demonstrate problem solving for (SLP): Basic daily situations ?LTG Patient will demonstrate problem solving for: Minimal Assistance - Patient > 75% ?  ?

## 2021-06-18 NOTE — Progress Notes (Signed)
Pt refused breakfast and lunch this AM, reevaluated and updated pt admission nutrition screening.  ?Sheela Stack, LPN  ?

## 2021-06-18 NOTE — Progress Notes (Signed)
Inpatient Rehabilitation Admission Medication Review by a Pharmacist ? ?A complete drug regimen review was completed for this patient to identify any potential clinically significant medication issues. ? ?High Risk Drug Classes Is patient taking? Indication by Medication  ?Antipsychotic Yes Compazine - N/V  ?Anticoagulant No   ?Antibiotic Yes Rifaxmin- hepatic encephalopathy  ?Opioid No   ?Antiplatelet No   ?Hypoglycemics/insulin Yes iSS, semglee- T2DM  ?Vasoactive Medication No   ?Chemotherapy No   ?Other Yes Folic acid, Thiamine- deficiency 2/2 SUD- alcohol ?Prednisone- acute alcoholic hepatitis ?Lactulose- hyperammonemia 2/2 decompensated alcoholic cirrhosis ?Protonix - esophageal varices ?Trazodone - sleep ?Robaxin - prn muscle spasms  ? ? ? ?Type of Medication Issue Identified Description of Issue Recommendation(s)  ?Drug Interaction(s) (clinically significant) ?    ?Duplicate Therapy ?    ?Allergy ?    ?No Medication Administration End Date ? Prednisone Per discharge summary, continue through 07/12/2021 to complete a 1 month course  ?Incorrect Dose ?    ?Additional Drug Therapy Needed ?    ?Significant med changes from prior encounter (inform family/care partners about these prior to discharge).    ?Other ? PTA meds: ?Lasix ?Vitamin D3 ?Protonix ?Potassium OTC ?Aldactone ?Vitamin B-12 Restart PTA meds when and if necessary on during CIR admission or at the time of discharge with the following exception(s):  ?Potassium OTC- patient is on a potassium-sparing diuretic PTA (spironolactone) which could lead to hyperkalemia  ? ? ?Clinically significant medication issues were identified that warrant physician communication and completion of prescribed/recommended actions by midnight of the next day:  No ? ? ?Time spent performing this drug regimen review (minutes):  30 ? ?Eric Dennis BS ?Pharmacy Student ?06/18/2021 7:23 AM ? ?Contact: [phone] after 3 PM ? ?"Be curious, not judgmental..." -Jamal Maes ?

## 2021-06-19 LAB — CBC WITH DIFFERENTIAL/PLATELET
Abs Immature Granulocytes: 0.42 10*3/uL — ABNORMAL HIGH (ref 0.00–0.07)
Basophils Absolute: 0 10*3/uL (ref 0.0–0.1)
Basophils Relative: 0 %
Eosinophils Absolute: 0.2 10*3/uL (ref 0.0–0.5)
Eosinophils Relative: 1 %
HCT: 19.6 % — ABNORMAL LOW (ref 39.0–52.0)
Hemoglobin: 7.1 g/dL — ABNORMAL LOW (ref 13.0–17.0)
Immature Granulocytes: 2 %
Lymphocytes Relative: 9 %
Lymphs Abs: 1.7 10*3/uL (ref 0.7–4.0)
MCH: 39 pg — ABNORMAL HIGH (ref 26.0–34.0)
MCHC: 36.2 g/dL — ABNORMAL HIGH (ref 30.0–36.0)
MCV: 107.7 fL — ABNORMAL HIGH (ref 80.0–100.0)
Monocytes Absolute: 2 10*3/uL — ABNORMAL HIGH (ref 0.1–1.0)
Monocytes Relative: 11 %
Neutro Abs: 14.5 10*3/uL — ABNORMAL HIGH (ref 1.7–7.7)
Neutrophils Relative %: 77 %
Platelets: 78 10*3/uL — ABNORMAL LOW (ref 150–400)
RBC: 1.82 MIL/uL — ABNORMAL LOW (ref 4.22–5.81)
WBC: 18.9 10*3/uL — ABNORMAL HIGH (ref 4.0–10.5)
nRBC: 0.1 % (ref 0.0–0.2)

## 2021-06-19 LAB — GLUCOSE, CAPILLARY
Glucose-Capillary: 113 mg/dL — ABNORMAL HIGH (ref 70–99)
Glucose-Capillary: 133 mg/dL — ABNORMAL HIGH (ref 70–99)
Glucose-Capillary: 146 mg/dL — ABNORMAL HIGH (ref 70–99)

## 2021-06-19 LAB — URINE CULTURE: Culture: NO GROWTH

## 2021-06-19 NOTE — Progress Notes (Signed)
?                                                       PROGRESS NOTE ? ? ?Subjective/Complaints: ?No new complaints this morning from patient or his wife.  ?Did well with therapy and is feeling fatigued now ?Would like to go home even earlier than discharge date if possible ? ?ROS: +insomnia, +fatigue ? ?Objective: ?  ?No results found. ?Recent Labs  ?  06/18/21 ?4665 06/19/21 ?9935  ?WBC 21.0* 18.9*  ?HGB 7.8* 7.1*  ?HCT 21.9* 19.6*  ?PLT 76* 78*  ? ?Recent Labs  ?  06/18/21 ?0541  ?NA 130*  ?K 4.0  ?CL 96*  ?CO2 27  ?GLUCOSE 116*  ?BUN 40*  ?CREATININE 1.17  ?CALCIUM 8.6*  ? ? ?Intake/Output Summary (Last 24 hours) at 06/19/2021 1343 ?Last data filed at 06/19/2021 7017 ?Gross per 24 hour  ?Intake 75 ml  ?Output 300 ml  ?Net -225 ml  ?  ? ?Pressure Injury 06/08/21 Elbow Left;Posterior DTI around elbow (Active)  ?06/08/21 1130  ?Location: Elbow  ?Location Orientation: Left;Posterior  ?Staging:   ?Wound Description (Comments): DTI around elbow  ?Present on Admission: Yes  ? ? ?Physical Exam: ?Vital Signs ?Blood pressure (!) 107/54, pulse 91, temperature 97.8 ?F (36.6 ?C), temperature source Oral, resp. rate 18, height '6\' 4"'$  (1.93 m), weight 90.9 kg, SpO2 94 %. ?Gen: no distress, normal appearing, BMI 24.39, very fatigued ?HEENT: oral mucosa pink and moist, NCAT, jaundice ?Cardio: Reg rate ?Chest: normal effort, normal rate of breathing ?Abd: soft, non-distended ?Ext: Bilateral lower extremity edema ?Psych: pleasant, normal affect ?Skin: intact ?Neuro: Alert and oriented x3.  ?Musculoskeletal: 5/5 strength in upper extremities, 3/5 strength in lower extremities, limited by edema ?Ambulating MinA with RW ? ? ?Assessment/Plan: ?1. Functional deficits which require 3+ hours per day of interdisciplinary therapy in a comprehensive inpatient rehab setting. ?Physiatrist is providing close team supervision and 24 hour management of active medical problems listed below. ?Physiatrist and rehab team continue to assess barriers  to discharge/monitor patient progress toward functional and medical goals ? ?Care Tool: ? ?Bathing ?   ?   ?   ?  ?  ?Bathing assist Assist Level: Minimal Assistance - Patient > 75% ?  ?  ?Upper Body Dressing/Undressing ?Upper body dressing   ?What is the patient wearing?: [place] only ?   ?Upper body assist Assist Level: Contact Guard/Touching assist ?   ?Lower Body Dressing/Undressing ?Lower body dressing ? ? ?   ?What is the patient wearing?: Incontinence brief, Hospital gown only ? ?  ? ?Lower body assist Assist for lower body dressing: Minimal Assistance - Patient > 75% ?   ? ?Toileting ?Toileting    ?Toileting assist Assist for toileting: Dependent - Patient 0% ?  ?  ?Transfers ?Chair/bed transfer ? ?Transfers assist ?   ? ?Chair/bed transfer assist level: Minimal Assistance - Patient > 75% ?  ?  ?Locomotion ?Ambulation ? ? ?Ambulation assist ? ?   ? ?Assist level: Minimal Assistance - Patient > 75% ?Assistive device: Walker-rolling ?Max distance: 23f  ? ?Walk 10 feet activity ? ? ?Assist ?   ? ?Assist level: Minimal Assistance - Patient > 75% ?Assistive device: Walker-rolling  ? ?Walk 50 feet activity ? ? ?Assist Walk 50 feet with 2 turns activity did  not occur: Safety/medical concerns ? ?  ?   ? ? ?Walk 150 feet activity ? ? ?Assist Walk 150 feet activity did not occur: Safety/medical concerns ? ?  ?  ?  ? ?Walk 10 feet on uneven surface  ?activity ? ? ?Assist Walk 10 feet on uneven surfaces activity did not occur: Safety/medical concerns ? ? ?  ?   ? ?Wheelchair ? ? ? ? ?Assist Is the patient using a wheelchair?: Yes ?  ?  ? ?Wheelchair assist level: Supervision/Verbal cueing ?Max wheelchair distance: 70f  ? ? ?Wheelchair 50 feet with 2 turns activity ? ? ? ?Assist ? ?  ?  ? ? ?Assist Level: Moderate Assistance - Patient 50 - 74%  ? ?Wheelchair 150 feet activity  ? ? ? ?Assist ?   ? ? ?Assist Level: Maximal Assistance - Patient 25 - 49%  ? ?Blood pressure (!) 107/54, pulse 91, temperature 97.8 ?F  (36.6 ?C), temperature source Oral, resp. rate 18, height '6\' 4"'$  (1.93 m), weight 90.9 kg, SpO2 94 %. ? ? ? Medical Problem List and Plan: ?1. Functional deficits secondary to functional deficits secondary to decompensated liver failure, alcoholic cirrhosis/encephalopathy ?            -patient may shower ?            -ELOS/Goals: 6 days S ?            Continue CIR ?2.  Antithrombotics: ?-DVT/anticoagulation:  Mechanical:  Antiembolism stockings, knee (TED hose) Bilateral lower extremities ?            -antiplatelet therapy: none ?3. Pain Management: Tylenol as needed ?4. Mood: LCSW to evaluate and provide emotional support ?            -antipsychotic agents: na ?5. Neuropsych: This patient is capable of making decisions on his own behalf. ?6. Skin/Wound Care: Routine skin care checks ?7. Fluids/Electrolytes/Nutrition: Routine I's and O's and follow-up chemistries ?            -- Carb modified, 2 g sodium diet ?8: Cirrhosis/alcoholic hepatitis: ?            --continue prednisone to complete 28 day steroid treatment per GI ?            --continue rifaximin 550 mg BID ?            --continue lactulose 20 g BID (goal 3-4 BMs daily) ?            --continue thiamine supplementation ?9: Hyperglycemia secondary to steroids: ?            --CBGs q AC and q HS ?            --Semeglee 15 units daily ?            --SSI Novolog 0-9 units q AC ?            --meal coverage Novolog 3 units TID q AC ?10: Esophageal varices recently banded: ?            --continue Protonix 40 mg daily ?            --monitor for GI bleeding ?11: Thrombocytopenia: follow CBC ?--monitor for bleeding ?            --no pharmacologic DVT prophy ?AKI/HRS: improved; monitor BUN ?12: Hyponatremia: Liberalize diet to regular ?13: Elevated LFTs secondary to # 8: stable, monitor outpatient.  ?14: Hypoproteinemia  ?14: Leukocytosis: improving, procal 0.33.  UA and BC with no growth ?            --completed ceftriaxone for SBP ?15: Alcohol abuse: provide cessation  counseling ?16: Palliative care consult follow-up as outpatient ?17: Anemia, multifactorial: no active bleeding currently; monitor CBCB outpatient.  ?18. Lower extremity edema: place nursing order for leg elevation, compression garments, and ice prn ?19. Screening for vitamin D deficiency: vitamin D reviewed and stable ?20. Magnesium deficiency: magnesium reviewed and now stable ? ?LOS: ?2 days ?A FACE TO FACE EVALUATION WAS PERFORMED ? ?Eric Dennis [name] ?06/19/2021, 1:43 PM  ? ?  ?

## 2021-06-19 NOTE — Progress Notes (Signed)
Patient ID: Eric Dennis, male   DOB: July 02, 1965, 56 y.o.   MRN: [id number] ?Met with the patient and his wife to introduce self, review rehab process and plan of care. Discussed medications and dietary modification recommendations for secondary risk management including HLD and food sources to increase protein stores. Reviewed need for insulin administration for discharge and chronulac. Given access code for My Chart account. Continue to follow along to discharge to address educational needs and facilitate preparation for discharge. Dorien Chihuahua B ? ?

## 2021-06-19 NOTE — Progress Notes (Signed)
Physical Therapy Session Note ? ?Patient Details  ?Name: Eric Dennis ?MRN: [id number] ?Date of Birth: [birth year] ? ?Today's Date: 06/19/2021 ?PT Individual Time: 7510-2585 ?PT Individual Time Calculation (min): 72 min  ? ?Short Term Goals: ?Week 1:  PT Short Term Goal 1 (Week 1): STG = LTG due to ELOS ? ?Skilled Therapeutic Interventions/Progress Updates:  ?   ?Pt received supine in bed with wife at bedside. Wife reporting pt just received his breakfast tray and wanting to finish this prior to therapy - while pt ate breakfast, discussed with wife how pt tolerated therapy yesterday and PT Goals and his current mobility status. Discussed DME rec's of possible w/c, RW and wife was open to this. She was also wanting to speak with CSW regarding paperwork and CSW notified via secure chat. Wife present throughout session for active observation to prepare for upcoming DC.  ? ?Donned pants at bed level with minA for threading and pt requiring encouragement to assist in donning - completed via bridging technique and pt able to clear hips to pull pants up. HOB flat and no bed rails, pt completed supine<>sitting with supervision but he required verbal cueing for log rolling technique to assist with pushing trunk to upright. Sitting balance with supervision and completed sit<>stand to RW with CGA. Ambulated with minA and RW ~17f within his room to the w/c - required assist for steadying and instructional cues for RW management and keeping body within walker frame. He also required verbal cueing for safety awareness while turning to sit to the wheelchair, feeling legs against chair and reaching for armrests.  ? ?Transported to day room rehab gym and assisted to Nustep with minA stand<>pivot transfer. Completed 8 minutes using BLE/BUE, resistance set to 5. Nustep activity targeting endurance, activity tolerance, and general strengthening.  ? ?Ambulated to mat table in rehab gym, ~268fwith minA and RW with similar cues as above  - he continues to struggle with safety awareness while turning to sit.  ? ?Completed 3x5 sit<>stands from low mat table height - CGA for safety but with no AD and rest breaks provided b/w sets.  ? ?Instructed in 2x8 + 1x4 shldr press using 5# dumbbell while seated on mat table - difficulty completing last set and he fatigued quite quickly.  ? ?Completed 3x10 LAQ with 2# ankle weights while seated on mat table - cueing needed for full extension bilaterally with onset of fatigue around the 8th rep. ? ?Ambulated back to w/c with minA and RW ~2522fith increased fatigue and concern for B knee buckling - required increased assist for turning safely to sit to w/c with poor insight and safety awareness.  ? ?Transported to room and assisted to recliner in similar manner. Provided knee high compression socks to assist with swelling, MD order placed. Chair alarm on, BLE elevated, all needs met with wife at bedside.  ? ?Therapy Documentation ?Precautions:  ?Precautions ?Precautions: Fall, Other (comment) ?Precaution Comments: multiple falls at home ?Restrictions ?Weight Bearing Restrictions: No ?General: ?  ? ?Therapy/Group: Individual Therapy ? ?Dillion Stowers P Jenita Rayfield ?06/19/2021, 7:23 AM  ?

## 2021-06-19 NOTE — Progress Notes (Signed)
Occupational Therapy Session Note ? ?Patient Details  ?Name: Eric Dennis ?MRN: [id number] ?Date of Birth: November 03, 1965 ? ?Today's Date: 06/19/2021 ? ?OT Individual Time: 9276-3943 ?OT Individual Time Calculation (min): 71 min  ? ?OT Individual Time: 2003-7944 ?OT Individual Time Calculation (min): 8 min  ? ? ?Short Term Goals: ?Week 1:  OT Short Term Goal 1 (Week 1): STGs=LTGs due to ELOS ? ?Skilled Therapeutic Interventions/Progress Updates:  ?Session 1: Patient met seated in recliner in agreement with OT treatment session. Wife present at bedside throughout session. Denies pain at rest and with activity. Noted generalized fatigue with mild activity. Sit to stand from low recliner requiring 2 attempts. Able to stand on 2nd attempt with rocking technique. Functional mobility to TTB in walk-in shower with RW and Min A. Patient sat prior to OT instruction 2/2 fatigue. Sit to stand from TTB with Mod A. Able to doff UB clothing with S. Max A to doff LB clothing. Able to laterally scoot R<>L on TTB for positioning prior to bathing. UB bathing/dressing with Min A and LB bathing/dressing with Max A. Able to attain figure-4 position seated in wc to doff hospital socks. External assist to don TED hose. Patient then able to don bilateral shoes with Mod A and more than increased time 2/2 fatigue. Able to complete wc push-up for placement of chair alarm requiring 2 attempts. Total A for wc transport to sink surface for time management. Oral hygiene seated at sink level with Min A. Slow processing and increased time to respond noted throughout session. Session concluded with patient seated in wc with call bell within reach, chair alarm activated and all needs met.  ? ?Session 2: Patient met lying in supine asleep. Difficult to awake. With maximal coaxing/encouragement patient transferred from supine to EOB with S. While attempting to don footwear patient stated "You gone have to cancel this" in regard to treatment session.  Education provided on participation with therapy efforts to maximize independence with ADLs/ADL transfers and mobility in prep for safe d/c home. Patient expressed verbal understanding. Patient missed 27 minutes of skilled OT treatment session 2/2 fatigue. Session concluded with patient lying supine in bed with call bell within reach and all needs met.  ? ?Therapy Documentation ?Precautions:  ?Precautions ?Precautions: Fall, Other (comment) ?Precaution Comments: multiple falls at home ?Restrictions ?Weight Bearing Restrictions: No ?General: ?General ?OT Amount of Missed Time: 37 Minutes ?Therapy/Group: Individual Therapy ? ?Eric Dennis ?06/19/2021, 3:01 PM ?

## 2021-06-19 NOTE — Progress Notes (Addendum)
Speech Language Pathology Daily Session Note ? ?Patient Details  ?Name: Eric Dennis ?MRN: [id number] ?Date of Birth: 1965-03-22 ? ?Today's Date: 06/19/2021 ?SLP Individual Time: 1130-1200 ?SLP Individual Time Calculation (min): 30 min ? ?Short Term Goals: ?Week 1: SLP Short Term Goal 1 (Week 1): STG's = LTG's due to ELOS ? ?Skilled Therapeutic Interventions: Skilled ST treatment focused on cognitive-communication and speech goals. Pt received upright and alert in wheelchair on arrival with spouse present. SLP facilitated session by providing min A verbal redirection cues for sustained attention for 10 minute intervals, min A verbal cues and additional processing time for basic verbal reasoning and problem solving skills. SLP targeted verbal expression and speech intelligibility through generating names to reflect each letter of the alphabet (i.e., A for [place] for [place]). Patient required min A verbal cues to over-articulate and increase vocal intensity through obtaining greater breath support via diaphragmatic breathing. Pt with average carry over at the word and phrase level, however at least mod A verbal reminders were necessary to implement speech strategies at sentence and conversation level. Pt requested to return to bed at end of session. Pt completed transfer using RW and min A; verbal cues were needed to ensure pt was aligned with bed prior to sitting. Spouse provided effective cues for pt safety. Patient was left in bed with  immediate needs within reach at end of session. Continue per current plan of care.   ?   ?Pain ? None ? ?Therapy/Group: Individual Therapy ? ?Mearl Harewood T Angelin Cutrone ?06/19/2021, 4:12 PM ?

## 2021-06-19 NOTE — Patient Care Conference (Signed)
Inpatient RehabilitationTeam Conference and Plan of Care Update ?Date: 06/18/2021   Time: 11:03 AM  ? ? ?Patient Name: Eric Dennis      ?Medical Record Number: [id number]  ?Date of Birth: Sep 18, 1965 ?Sex: Male         ?Room/Bed: 3Y86V/7Q46N-62 ?Payor Info: Payor: [place] / Plan: [place] MEDICAID [place] / Product Type: *No Product type* /   ? ?Admit Date/Time:  06/17/2021  4:27 PM ? ?Primary Diagnosis:  Cirrhosis of liver ([place]) ? ?Hospital Problems: Principal Problem: ?  Cirrhosis of liver ([place]) ?Active Problems: ?  Decompensated hepatic cirrhosis ([place]) ? ? ? ?Expected Discharge Date: Expected Discharge Date: 06/23/21 ? ?Team Members Present: ?Physician leading conference: Dr. Leeroy Cha ?Social Worker Present: Ovidio Kin, LCSW ?Nurse Present: Dorien Chihuahua, RN ?PT Present: Ginnie Smart, PT ?OT Present: Leretha Pol, OT ?SLP Present: Other (comment) Helaine Chess, SLP) ?PPS Coordinator present : Gunnar Fusi, SLP ? ?   Current Status/Progress Goal Weekly Team Focus  ?Bowel/Bladder ? ?   Continent of bowel and bladder        ?Swallow/Nutrition/ Hydration ? ? Regular diet with thin liquids  No goals  N/A   ?ADL's ? ? Min assist LB dressing, CGA-min assist functional transfers, CGA UB dressing EOB  CGA-supervision  Initial evaluation, self care training, functional transfer training, family education, initiated dc planning   ?Mobility ? ? PT EVAL PENDING         ?Communication ? ? Min A  Supervision  Speech intelligiblity   ?Safety/Cognition/ Behavioral Observations ? Min A  Supervision  Sequencing   ?Pain ? ?   N/A        ?Skin ? ?   DTI left elbow        ? ? ?Discharge Planning:  ?HOme with wife who is trying to be is paid caregiver with his medicaid. Both aware recommendation will be 24/7 supervision at DC-new eval today   ?Team Discussion: ?Patient is limited by debility and fatigue with poor motivation and baseline cognitive impairments. Limited activity PTA;  simple meal prep and housekeeping.  ?Patient on target to meet rehab goals: ?Evals pending. SLP noted dysarthric with decreased speech intelligibility and sequencing issues ? ?*See Care Plan and progress notes for long and short-term goals.  ? ?Revisions to Treatment Plan:  ?N/A ?  ?Teaching Needs: ?Safety, medication management/insulin administration, transfers, toileting, etc  ?Current Barriers to Discharge: ?Decreased caregiver support ? ?Possible Resolutions to Barriers: ?Family education ?HH follow up services ?DME: transport chair ?  ? ? Medical Summary ?Current Status: pressure area on left elbow, type 2 diabetes, liver cirrhosis with jaundice, creatinine and electrolytes stable, insomnia, severe cognitive impairement ? Barriers to Discharge: Medical stability;Wound care ? Barriers to Discharge Comments: pressure area on left elbow, type 2 diabetes, liver cirrhosis with jaundice,  insomnia, hyponatermia, severe cognitive impairment ?Possible Resolutions to [employer]: daily wound care, repeat WBC tomorrow, infectious workup today, check Na tomorrow, place chart on door requesitng patient not be woken at night ? ? ?Continued Need for Acute Rehabilitation Level of Care: The patient requires daily medical management by a physician with specialized training in physical medicine and rehabilitation for the following reasons: ?Direction of a multidisciplinary physical rehabilitation program to maximize functional independence : Yes ?Analysis of laboratory values and/or radiology reports with any subsequent need for medication adjustment and/or medical intervention. : Yes ? ? ?I attest that I was present, lead the team conference, and concur with the  assessment and plan of the team. ? ? ?Dorien Chihuahua B ?06/19/2021, 10:57 AM  ? ? ? ? ? ? ?

## 2021-06-20 LAB — GLUCOSE, CAPILLARY
Glucose-Capillary: 98 mg/dL (ref 70–99)
Glucose-Capillary: 127 mg/dL — ABNORMAL HIGH (ref 70–99)
Glucose-Capillary: 229 mg/dL — ABNORMAL HIGH (ref 70–99)
Glucose-Capillary: 177 mg/dL — ABNORMAL HIGH (ref 70–99)

## 2021-06-20 NOTE — Progress Notes (Signed)
ccupational Therapy Discharge Summary ? ?Patient Details  ?Name: Eric Dennis ?MRN: [id number] ?Date of Birth: 05/07/1965 ? ? ? ? ?Patient has met 3 of 7 long term goals due to improved activity tolerance, improved balance, postural control, and ability to compensate for deficits.  Patient to discharge at overall contact guard to Supervision level.  Patient's care partner is independent to provide the necessary physical and cognitive assistance at discharge.   ? ?Reasons goals not met: Pt dependent for toileting tasks. Pt min A for LB dressing, toilet tranfsers, and TTB transfers. ? ?Recommendation:  ?Patient will benefit from ongoing skilled OT services in home health setting to continue to advance functional skills in the area of BADL. ? ?Equipment: ?Bedside commode and transport chair;recommended RW and shower bench as well ? ?Reasons for discharge: treatment goals met and discharge from hospital ? ?Patient/family agrees with progress made and goals achieved: Yes ? ?OT Discharge ?ADL ?ADL ?Eating: Set up ?Where Assessed-Eating: Edge of bed ?Grooming: Supervision/safety ?Where Assessed-Grooming: Edge of bed ?Upper Body Bathing: Supervision/safety ?Where Assessed-Upper Body Bathing: Edge of bed ?Lower Body Bathing: Contact guard ?Where Assessed-Lower Body Bathing: Edge of bed ?Upper Body Dressing: Supervision/safety ?Where Assessed-Upper Body Dressing: Edge of bed ?Lower Body Dressing: Contact guard ?Where Assessed-Lower Body Dressing: Edge of bed ?Toileting: Dependent ?Where Assessed-Toileting: Other (Comment) (EOB/standing at EOB) ?Toilet Transfer: Contact guard ?Toilet Transfer Method: Stand pivot ?Toilet Transfer Equipment: Bedside commode ?Tub/Shower Transfer: Not assessed ?Vision ?Baseline Vision/History: 0 No visual deficits ?Patient Visual Report: No change from baseline ?Vision Assessment?: No apparent visual deficits ?Perception  ?Perception: Within Functional Limits ?Praxis ?Praxis:  Intact ?Cognition ?Cognition ?Overall Cognitive Status: History of cognitive impairments - at baseline ?Arousal/Alertness: Awake/alert ?Orientation Level: Person;Place;Situation ?Person: Oriented ?Place: Oriented ?Situation: Oriented ?Memory: Appears intact ?Decreased Short Term Memory: Functional basic;Verbal complex ?Attention: Sustained ?Focused Attention: Appears intact ?Sustained Attention: Appears intact ?Awareness: Impaired ?Awareness Impairment: Emergent impairment ?Problem Solving: Impaired ?Problem Solving Impairment: Functional basic ?Safety/Judgment: Impaired ?Brief Interview for Mental Status (BIMS) ?Repetition of Three Words (First Attempt): 3 ?Temporal Orientation: Year: Correct ?Temporal Orientation: Month: Accurate within 5 days ?Temporal Orientation: Day: Incorrect ?Recall: "Sock": Yes, no cue required ?Recall: "Blue": Yes, no cue required ?Recall: "Bed": No, could not recall ?BIMS Summary Score: 12 ?Sensation ?Sensation ?Light Touch: Appears Intact ?Hot/Cold: Appears Intact ?Proprioception: Appears Intact ?Stereognosis: Not tested ?Motor  ?Motor ?Motor: Other (comment) ?Motor - Skilled Clinical Observations: generalized weakness and deconditioning ?Motor - Discharge Observations: generalized weakness and deconditioning ?   ?Trunk/Postural Assessment  ?Cervical Assessment ?Cervical Assessment: Exceptions to WFL (forward head) ?Thoracic Assessment ?Thoracic Assessment: Exceptions to WFL (rounded shoulders) ?Lumbar Assessment ?Lumbar Assessment: Exceptions to WFL (posterior pelvic tilt)  ?Balance ?Static Sitting Balance ?Static Sitting - Balance Support: Feet supported ?Static Sitting - Level of Assistance: 5: Stand by assistance ?Dynamic Sitting Balance ?Dynamic Sitting - Balance Support: During functional activity ?Dynamic Sitting - Level of Assistance: 5: Stand by assistance ?Static Standing Balance ?Static Standing - Balance Support: During functional activity;Bilateral upper extremity  supported ?Static Standing - Level of Assistance: 5: Stand by assistance ?Extremity/Trunk Assessment ?RUE Assessment ?RUE Assessment: Exceptions to WFL ?General Strength Comments: 4/5 ?LUE Assessment ?LUE Assessment: Exceptions to WFL ?General Strength Comments: 4/5 ? ? ?Eric Dennis, Eric Dennis ?06/22/2021, 9:55 AM ?

## 2021-06-20 NOTE — Discharge Summary (Signed)
Physical Therapy Discharge Summary ? ?Patient Details  ?Name: Eric Dennis ?MRN: [id number] ?Date of Birth: 1966-01-13 ? ?Today's Date: 06/22/2021 ?PT Individual Time: 1030-1130, 1515-1600 ?PT Individual Time Calculation (min): 60, 45 min  ? ? ?Patient has met 7 of 7 long term goals due to improved activity tolerance, improved balance, increased strength, and ability to compensate for deficits.  Patient to discharge at an ambulatory level [place].  Pt will require transport chair for longer distances (>48f) but will ambulate short household distances with min assist. Patient's care partner is independent to provide the necessary physical and cognitive assistance at discharge. Patient's care partner, his wife, was trained on bed mobility, transfers, car transfers, falls recovery and use of 911, and short distance gait - she demonstrated appropriate ability to safely manage patient and instruct him with mobility.  ? ?Reasons goals not met: na ? ?Recommendation:  ?Patient will benefit from ongoing skilled PT services in home health setting to continue to advance safe functional mobility, address ongoing impairments in generalized weakness and deconditioning, dynamic standing balance, gait training, home safety, caregiver training, and minimize fall risk. ? ?Equipment: ?TSystems analyst Family is getting RW on their own ? ?Reasons for discharge: treatment goals met and discharge from hospital ? ?Patient/family agrees with progress made and goals achieved: Yes ? ?PT Discharge ?Tx 1: ? ?Pt received asleep in bed.  Wife present.  Tx focused on continued family ed for car transfers, gait, bed mobility, sit> stand and transfers.  Pt lethargic but cooperative throughout session.  At end of session, pt resting in bed with alarm set and needs at hand; wife present. ? ?Tx 2: ? ?Pt asleep in bed and reluctant to participate. Wife present.  Pt noted to be wet- bed pad, brief and pants.  Pt rolled and bridged multiple times  while his wife doffed wet items, performed peri care , replaced bed pad and donned dry brief.  Discussed safety regarding falls, and use of 911 for fall recovery; wife voiced understanding.  Pt agreed to practice sit>< supine in bed for further teaching of wife regarding protection of her back.  At end of session, pt resting in bed with needs at hand and alarm set. Education of wife complete. ?Precautions/Restrictions ?Precautions ?Precaution Comments: pt has very low endurance and is always fatigued ?Restrictions ?Weight Bearing Restrictions: No ?Pain ?Pain Assessment ?Pain Score: 0-No pain ?Pain Interference ?Pain Interference ?Pain Effect on Sleep: 2. Occasionally ?Pain Interference with Therapy Activities: 1. Rarely or not at all ?Pain Interference with Day-to-Day Activities: 1. Rarely or not at all  ?Vision/Perception - no deficits per pt; no change from baseline ?   ?Cognition ?Overall Cognitive Status: History of cognitive impairments - at baseline ?Arousal/Alertness: Lethargic ?Memory: Impaired ?Problem Solving: Impaired ?Safety/Judgment: Impaired ?Sensation ?Sensation ?Light Touch: Appears Intact ?Proprioception: Appears Intact ?Coordination ?Gross Motor Movements are Fluid and Coordinated: No ?Fine Motor Movements are Fluid and Coordinated: No ?Heel Shin Test: unable due to weakness ?Motor  ?Motor ?Motor: Other (comment) ?Motor - Discharge Observations: global weakness  ?Mobility ?Bed Mobility ?Bed Mobility: Rolling Right;Rolling Left;Left Sidelying to Sit;Sit to Supine ?Rolling Right: Supervision/verbal cueing ?Rolling Left: Supervision/Verbal cueing ?Left Sidelying to Sit: Contact Guard/Touching assist ?Supine to Sit: Contact Guard/Touching assist ?Sit to Supine: Supervision/Verbal cueing ?Transfers ?Transfers: Stand Pivot Transfers ?Sit to Stand: Contact Guard/Touching assist ?Stand to Sit: Contact Guard/Touching assist ?Stand Pivot Transfers: Minimal Assistance - Patient > 75% ?Stand Pivot Transfer  Details (indicate cue type and reason): safety and movement  of RW ?Transfer (Assistive device): Rolling walker ?Locomotion  ?Gait ?Ambulation: Yes ?Gait Assistance: Contact Guard/Touching assist ?Gait Distance (Feet): 25 Feet ?Assistive device: Rolling walker ?Gait Assistance Details: Verbal cues for safe use of DME/AE;Verbal cues for gait pattern;Verbal cues for technique ?Gait ?Gait: Yes ?Gait Pattern: Impaired ?Gait Pattern: Shuffle;Decreased trunk rotation;Narrow base of support;Step-through pattern;Decreased hip/knee flexion - left ?Stairs / Additional Locomotion ?Stairs: No ?Pick up small object from the floor (from standing position) activity did not occur: Safety/medical concerns ?Wheelchair Mobility ?Wheelchair Mobility: Yes (not a focus of tx) ?Wheelchair Assistance: Moderate Assistance - Patient 50 - 74% ?Wheelchair Propulsion: Both upper extremities ?Wheelchair Parts Management: Needs assistance ?Distance: 25   ?Trunk/Postural Assessment  ?Cervical Assessment ?Cervical Assessment: Exceptions to [facility] (forward head) ?Thoracic Assessment ?Thoracic Assessment: Exceptions to [facility] (rounded shoulders) ?Lumbar Assessment ?Lumbar Assessment: Exceptions to [facility] (posterior pelvic tlit) ?Postural Control ?Postural Control: Within Functional Limits (posterior bias)  ?Balance ?Balance ?Balance Assessed: Yes ?Standardized Balance Assessment ?Standardized Balance Assessment: Berg Balance Test Merrilee Jansky on 4/12 = 9/56.  Berg not re-assessed on 4/16 due to pt fatigue and focus on family ed.) ?Static Sitting Balance ?Static Sitting - Balance Support: Feet supported ?Static Sitting - Level of Assistance: 5: Stand by assistance ?Dynamic Sitting Balance ?Dynamic Sitting - Balance Support: During functional activity ?Dynamic Sitting - Level of Assistance: 5: Stand by assistance ?Static Standing Balance ?Static Standing - Balance Support: During functional activity;Bilateral upper extremity supported ?Static Standing - Level of  Assistance: 5: Stand by assistance  ?Extremity Assessment  ?  ?  ?RLE Assessment ?RLE Assessment: Exceptions to [facility] ?General Strength Comments: tested grossly in sitting ?RLE Strength ?RLE Overall Strength: Deficits ?Right Hip Flexion: 4-/5 ?Right Knee Flexion: 3+/5 ?Right Knee Extension: 4+/5 ?Right Ankle Dorsiflexion: 4/5 ?LLE Assessment ?LLE Assessment: Exceptions to [facility] ?LLE Strength ?LLE Overall Strength: Deficits ?LLE Overall Strength Comments: tested grossly in sitting ?Left Hip Flexion: 4/5 ?Left Knee Flexion: 4-/5 ?Left Knee Extension: 4/5 ?Left Ankle Dorsiflexion: 4-/5 ? ? ? ?Odelle Kosier PT, DPT ?06/22/2021, 5:42 PM ?

## 2021-06-20 NOTE — Progress Notes (Addendum)
Occupational Therapy Session Note ? ?Patient Details  ?Name: Eric Dennis ?MRN: [id number] ?Date of Birth: Jul 27, 1965 ? ?Today's Date: 06/20/2021 ?OT Individual Time: 2353-6144 ?OT Individual Time Calculation (min): 75 min  ? ? ?Short Term Goals: ?Week 1:  OT Short Term Goal 1 (Week 1): STGs=LTGs due to ELOS ? ?Skilled Therapeutic Interventions/Progress Updates:  ?  Pt semi reclined in bed, no c/o pain, flat affect but agreeable to OT session.  Pts wife at bedside and present throughout session.  Pt completed left roll and sidelying to sit with supervision and increased time. Noted pt with soiled brief of urine. Pt politely declining to ambulate to sink, instead requesting to stand pivot to w/c first.  Stand pivot completed with min assist using RW and Vcs to keep device closer to body and to avoid premature sitting.  Pt self propelled to sink with supervision and step by step cues for w/c mgt.  Pt doffed shirt, bathed UB, brushed teeth and donned clean shirt with supervision needing verbal question cues for initiation of each task.  Pt doffed brief, bathed periarea and buttocks in standing with CGA.  Pt donned clean brief with mod assist and step by step instruction of novel task at sit<>stand level.  Donned pants with education on compensatory technique using figure 4 and gathering of pant leg prior to threading over foot and pt completed with min assist at sit<>stand level.  Discussed with pt and wife regarding recommendation for TTB at discharge for tub shower transfers.  Transported pt via w/c to ADL suite and provided visual demonstration of transfer technique and educated pts wife on how to procure DME.  Transported back to room with direct hand off to CSW.  Pt would benefit from blocked practice shower bench transfer at tub shower level in future sessions.  ? ?Therapy Documentation ?Precautions:  ?Precautions ?Precautions: Fall ?Precaution Comments: ETOH abuse, liver failure (not a transplant candidate), hx  of multiple falls ?Restrictions ?Weight Bearing Restrictions: No ? ? ? ?Therapy/Group: Individual Therapy ? ?[name] ?06/20/2021, 9:15 AM ?

## 2021-06-20 NOTE — IPOC Note (Signed)
Overall Plan of Care (IPOC) ?Patient Details ?Name: Eric Dennis ?MRN: [id number] ?DOB: 1966-02-27 ? ?Admitting Diagnosis: Cirrhosis of liver ([place]) ? ?Hospital Problems: Principal Problem: ?  Cirrhosis of liver ([place]) ?Active Problems: ?  Decompensated hepatic cirrhosis ([place]) ? ? ? ? Functional Problem List: ?Nursing Bladder, Medication Management, Safety, Bowel, Nutrition, Endurance, Skin Integrity  ?PT Balance, Endurance, Motor, Edema, Safety  ?OT Balance, Behavior, Safety, Cognition, Edema, Skin Integrity, Endurance, Motor  ?SLP Cognition  ?TR    ?    ? Basic ADL?s: ?OT Grooming, Bathing, Dressing, Toileting  ? ?  Advanced  ADL?s: ?OT    ?   ?Transfers: ?PT Bed Mobility, Bed to Chair, Car  ?OT Toilet, Tub/Shower  ? ?  Locomotion: ?PT Ambulation, Stairs, Wheelchair Mobility  ? ?  Additional Impairments: ?OT None  ?SLP   ?  ?   ?TR    ? ? ?Anticipated Outcomes ?Item Anticipated Outcome  ?Self Feeding    ?Swallowing ? N/A ?  ?Basic self-care ? CGA- supervision  ?Toileting ? supervision ?  ?Bathroom Transfers CGA  ?Bowel/Bladder ? Manage bowel with mod I assist and bladder w toileting  ?Transfers ? CGA with LRAD  ?Locomotion ? minA with LRAD  ?Communication ? Supervision  ?Cognition ? Supervision  ?Pain ? n/a  ?Safety/Judgment ? maintain safety w cues  ? ?Therapy Plan: ?PT Intensity: Minimum of 1-2 x/day ,45 to 90 minutes ?PT Frequency: 5 out of 7 days ?PT Duration Estimated Length of Stay: 5-9 days ?OT Intensity: Minimum of 1-2 x/day, 45 to 90 minutes ?OT Frequency: 5 out of 7 days ?OT Duration/Estimated Length of Stay: 5-7 days ?SLP Intensity: Minumum of 1-2 x/day, 30 to 90 minutes ?SLP Frequency: 1 to 3 out of 7 days ?SLP Duration/Estimated Length of Stay: 5 days  ? ?Due to the current state of emergency, patients may not be receiving their 3-hours of Medicare-mandated therapy. ? ? Team Interventions: ?Nursing Interventions Patient/Family Education, Bowel Management, Skin Care/Wound Management, Medication  Management, Disease Management/Prevention, Bladder Management, Discharge Planning  ?PT interventions Ambulation/gait training, Training and development officer, Cognitive remediation/compensation, Community reintegration, Discharge planning, Disease management/prevention, DME/adaptive equipment instruction, Functional mobility training, Pain management, Neuromuscular re-education, Psychosocial support, Patient/family education, Skin care/wound management, Stair training, Therapeutic Activities, Therapeutic Exercise, UE/LE Strength taining/ROM, UE/LE Coordination activities, Visual/perceptual remediation/compensation, Wheelchair propulsion/positioning  ?OT Interventions Balance/vestibular training, Discharge planning, Self Care/advanced ADL retraining, Therapeutic Activities, UE/LE Coordination activities, Pain management, Cognitive remediation/compensation, Disease mangement/prevention, Functional mobility training, Patient/family education, Skin care/wound managment, Therapeutic Exercise, DME/adaptive equipment instruction, Neuromuscular re-education, Splinting/orthotics, UE/LE Strength taining/ROM, Wheelchair propulsion/positioning, Psychosocial support, Community reintegration  ?SLP Interventions Cognitive remediation/compensation, Functional tasks, Patient/family education  ?TR Interventions    ?SW/CM Interventions Discharge Planning, Psychosocial Support, Patient/Family Education  ? ?Barriers to Discharge ?MD  Medical stability  ?Nursing Decreased caregiver support, Incontinence ?1 level/level entry w spouse  ?PT Decreased caregiver support, Insurance for SNF coverage, Lack of/limited family support ?   ?OT Incontinence, Medication compliance, Behavior ?Pt apathetic towards therapy  ?SLP   ?   ?SW Insurance for SNF coverage, Medication compliance, Behavior ?   ? ?Team Discharge Planning: ?Destination: PT-Home ,OT- Home , SLP-Home ?Projected Follow-up: PT-Home health PT, 24 hour supervision/assistance, OT-  24 hour  supervision/assistance, SLP-24 hour supervision/assistance ?Projected Equipment Needs: PT-To be determined, OT- To be determined, SLP-None recommended by SLP ?Equipment Details: PT-pt owns no DME, OT-  ?Patient/family involved in discharge planning: PT- Patient, Family member/caregiver,  OT-Patient, Family member/caregiver, SLP-Patient, Family member/caregiver ? ?MD ELOS: 6 days ?  Medical Rehab Prognosis:  Excellent ?Assessment: The patient has been admitted for CIR therapies with the diagnosis of debility. The team will be addressing functional mobility, strength, stamina, balance, safety, adaptive techniques and equipment, self-care, bowel and bladder mgt, patient and caregiver education. Goals have been set at supervision. Anticipated discharge destination is home. ? ? ?See Team Conference Notes for weekly updates to the plan of care  ?

## 2021-06-20 NOTE — Discharge Summary (Addendum)
Physician Discharge Summary  ?Patient ID: ?Eric Dennis ?MRN: [id number] ?DOB/AGE: 1966-02-07 56 y.o. ? ?Admit date: 06/17/2021 ?Discharge date: 06/23/2021 ? ?Discharge Diagnoses:  ?Principal Problem: ?  Cirrhosis of liver ([place]) ?Active Problems: ?  Decompensated hepatic cirrhosis ([place]) ?  Anemia of chronic disease ?  Leukocytosis ?  Steroid-induced hyperglycemia ?Thrombocytopenia ?Alcoholic hepatitis ?Hyperglycemia secondary to steroids ?Esophageal varices ?Acute kidney injury ?Hepatorenal syndrome ?Hypoproteinemia ?Leukocytosis ?Alcohol abuse ?Anemia ?Lower extremity edema ?Magnesium deficiency ? ?Discharged Condition: stable ? ?Significant Diagnostic Studies: ?DG Abd 1 View ? ?Result Date: 06/09/2021 ?CLINICAL DATA:  Orogastric tube placement. EXAM: ABDOMEN - 1 VIEW COMPARISON:  June 08, 2021. FINDINGS: Distal tip of nasogastric tube is seen in expected position of proximal stomach. IMPRESSION: Distal tip of nasogastric tube seen in expected position of proximal stomach. Electronically Signed   By: Marijo Conception M.D.   On: 06/09/2021 16:24  ? ?DG Abdomen 1 View ? ?Result Date: 06/08/2021 ?CLINICAL DATA:  Central line placement. EXAM: ABDOMEN - 1 VIEW COMPARISON:  None. FINDINGS: The bowel gas pattern is normal. No renal calculi. The distal tip of a right central femoral central line terminates at the level of L4. IMPRESSION: The distal tip of a right femoral central line terminates at the level of L4. Electronically Signed   By: Brett Fairy M.D.   On: 06/08/2021 04:37  ? ?CT Head Wo Contrast ? ?Result Date: 06/08/2021 ?CLINICAL DATA:  Mental status change, unknown cause. EXAM: CT HEAD WITHOUT CONTRAST TECHNIQUE: Contiguous axial images were obtained from the base of the skull through the vertex without intravenous contrast. RADIATION DOSE REDUCTION: This exam was performed according to the departmental dose-optimization program which includes automated exposure control, adjustment of the mA and/or kV according to  patient size and/or use of iterative reconstruction technique. COMPARISON:  None. FINDINGS: Brain: No acute intracranial hemorrhage, midline shift or mass effect. No extra-axial fluid collection. Gray-white matter differentiation is within normal limits and there is no hydrocephalus. Mild periventricular white matter hypodensities are noted bilaterally. Vascular: No hyperdense vessel or unexpected calcification. Skull: Normal. Negative for fracture or focal lesion. Sinuses/Orbits: A round density is present in the right maxillary sinus, possible mucosal retention cyst. The orbits are within normal limits. Other: None. IMPRESSION: No acute intracranial process. Electronically Signed   By: Brett Fairy M.D.   On: 06/08/2021 05:03  ? ?Portable Chest x-ray ? ?Result Date: 06/08/2021 ?CLINICAL DATA:  Endotracheal tube placement. EXAM: PORTABLE CHEST 1 VIEW COMPARISON:  Chest x-ray 08/25/2020. FINDINGS: Endotracheal tube tip is 4.4 cm above the carina. The lungs are clear. There is no pleural effusion or pneumothorax. Cardiomediastinal silhouette is within normal limits. Osseous structures are within normal limits. IMPRESSION: 1. Endotracheal tube tip 4.4 cm above carina. 2. Lungs are clear. Electronically Signed   By: Ronney Asters M.D.   On: 06/08/2021 19:16  ? ?DG Chest Port 1 View ? ?Result Date: 06/08/2021 ?CLINICAL DATA:  Altered mental status. EXAM: PORTABLE CHEST 1 VIEW COMPARISON:  08/26/2020. FINDINGS: The heart size and mediastinal contours are within normal limits. Both lungs are clear. A stable tiny radiopaque density is present over the left chest. No acute osseous abnormality. IMPRESSION: No active disease. Electronically Signed   By: Brett Fairy M.D.   On: 06/08/2021 02:43  ? ?DG Abd Portable 1V ? ?Result Date: 06/11/2021 ?CLINICAL DATA:  Feeding tube placement EXAM: PORTABLE ABDOMEN - 1 VIEW COMPARISON:  06/09/2021 FINDINGS: The nasogastric tube has been removed and a feeding tube placed. This  terminates  over the gastric antrum. No gross free intraperitoneal air. Right-side of the abdomen and pelvis are excluded. IMPRESSION: Feeding tube terminating at the gastric antrum. Electronically Signed   By: Abigail Miyamoto M.D.   On: 06/11/2021 10:29  ? ?US Abdomen Limited RUQ (LIVER/GB) ? ?Result Date: 06/08/2021 ?CLINICAL DATA:  Hyperbilirubinemia.  Cirrhosis EXAM: ULTRASOUND ABDOMEN LIMITED RIGHT UPPER QUADRANT COMPARISON:  04/09/2021 FINDINGS: Gallbladder: Biliary sludge and multiple small stones within the liver. Gallbladder wall thickness of 3.5 mm. No sonographic Murphy sign noted by sonographer. Common bile duct: Diameter: 3.5 mm. Liver: Coarsened hepatic echotexture with heterogeneously echogenic parenchyma. No focal liver lesion is identified sonographically. Slightly nodular hepatic surface contour. Portal vein is patent on color Doppler imaging with turbulent-appearing hepatopetal flow. Other: Paraumbilical varices again noted. No ascites evident in the right upper quadrant. IMPRESSION: 1. Cirrhotic hepatic morphology. Turbulent hepatopetal flow within the portal vein and paraumbilical varices suggest portal hypertension. 2. Cholelithiasis. Mild gallbladder thickening may be related to underlying liver disease. No sonographic Murphy sign to suggest acute cholecystitis. Electronically Signed   By: Davina Poke D.O.   On: 06/08/2021 12:18   ? ?Labs:  ?Basic Metabolic Panel: ?Recent Labs  ?Lab 06/18/21 ?5176 06/23/21 ?1607  ?NA 130* 131*  ?K 4.0 3.9  ?CL 96* 98  ?CO2 27 25  ?GLUCOSE 116* 110*  ?BUN 40* 27*  ?CREATININE 1.17 1.27*  ?CALCIUM 8.6* 8.1*  ?MG 2.1  --   ? ? ?CBC: ?Recent Labs  ?Lab 06/18/21 ?3710 06/19/21 ?6269 06/21/21 ?4854 06/23/21 ?6270  ?WBC 21.0* 18.9*  --  15.0*  ?NEUTROABS 16.6* 14.5*  --   --   ?HGB 7.8* 7.1* 7.5* 7.0*  ?HCT 21.9* 19.6* 21.2* 20.4*  ?MCV 106.8* 107.7*  --  110.3*  ?PLT 76* 78*  --  68*  ? ? ?CBG: ?Recent Labs  ?Lab 06/22/21 ?0622 06/22/21 ?1135 06/22/21 ?1710 06/22/21 ?2109  06/23/21 ?3500  ?GLUCAP 107* 126* 152* 177* 102*  ? ? ?Brief HPI:   Eric Dennis is a 56 y.o. male admitted on 06/08/2021 secondary to decompensated alcoholic cirrhosis.  He required admission to ICU with intubation and ventilation.  Gastroenterology consulted treated for alcoholic hepatitis and hepatorenal syndrome.  Transfuse 1 pack red blood cells.  Extubated and started on steroids and tube feeds. ? ? ?Hospital Course: Eric Dennis was admitted to rehab 06/17/2021 for inpatient therapies to consist of PT, ST and OT at least three hours five days a week. Past admission physiatrist, therapy team and rehab RN have worked together to provide customized collaborative inpatient rehab.  Follow-up laboratory work revealed increasing leukocytosis 4/12.  Work-up for infectious process initiated.  Urine and blood cultures negative.  Remains afebrile. Leukocytosis improved. Patient refusing lactulose. Pharmacy consult for prednisone taper. Patient given written prescription for this taper. ? ?Blood pressures were monitored on TID basis and remained stable. ? ?Hyperglycemia secondary to steroids has been monitored with ac/hs CBG checks and SSI was use prn for tighter BS control. Semglee 15 units daily. Patient was given written prescription for glucometer kit. Nursing education completed. ? ? ?Rehab course: During patient's stay in rehab weekly team conferences were held to monitor patient's progress, set goals and discuss barriers to discharge. At admission, patient required total assistance for transfers and ADLs, minimal rest with mobility. ? ?He has had improvement in activity tolerance, balance, postural control as well as ability to compensate for deficits. He has had improvement in functional use RUE/LUE  and RLE/LLE as  well as improvement in awareness ? ?Disposition: Home ?Discharge disposition: 01-Home or Self Care ? ? ? ? ? ?Diet: Low-sodium ? ?Special Instructions: ? ?Advised patient and family importance of  follow-up with PCP and Dr. Therisa Doyne regarding prednisone therapy and lab work follow-up. ? ?No driving, alcohol consumption or tobacco use.  ?Discharge Instructions   ? ? Ambulatory referral to Physical Medicine Re[facility]

## 2021-06-20 NOTE — Plan of Care (Signed)
Goals downgraded to minA overall due to slower than anticipated progress. ? ?Problem: RH Balance ?Goal: LTG Patient will maintain dynamic standing balance (PT) ?Description: LTG:  Patient will maintain dynamic standing balance with assistance during mobility activities (PT) ?Flowsheets (Taken 06/20/2021 0743) ?LTG: Pt will maintain dynamic standing balance during mobility activities with:: Minimal Assistance - Patient > 75% ?  ?Problem: Sit to Stand ?Goal: LTG:  Patient will perform sit to stand with assistance level (PT) ?Description: LTG:  Patient will perform sit to stand with assistance level (PT) ?Flowsheets (Taken 06/20/2021 0743) ?LTG: PT will perform sit to stand in preparation for functional mobility with assistance level: Minimal Assistance - Patient > 75% ?  ?Problem: RH Bed Mobility ?Goal: LTG Patient will perform bed mobility with assist (PT) ?Description: LTG: Patient will perform bed mobility with assistance, with/without cues (PT). ?Flowsheets (Taken 06/20/2021 0743) ?LTG: Pt will perform bed mobility with assistance level of: Minimal Assistance - Patient > 75% ?  ?Problem: RH Bed to Chair Transfers ?Goal: LTG Patient will perform bed/chair transfers w/assist (PT) ?Description: LTG: Patient will perform bed to chair transfers with assistance (PT). ?Flowsheets (Taken 06/20/2021 0743) ?LTG: Pt will perform Bed to Chair Transfers with assistance level: Minimal Assistance - Patient > 75% ?  ?Problem: RH Car Transfers ?Goal: LTG Patient will perform car transfers with assist (PT) ?Description: LTG: Patient will perform car transfers with assistance (PT). ?Flowsheets (Taken 06/20/2021 0743) ?LTG: Pt will perform car transfers with assist:: Minimal Assistance - Patient > 75% ?  ?Problem: RH Ambulation ?Goal: LTG Patient will ambulate in controlled environment (PT) ?Description: LTG: Patient will ambulate in a controlled environment, # of feet with assistance (PT). ?Flowsheets (Taken 06/20/2021 0743) ?LTG: Pt will  ambulate in controlled environ  assist needed:: Minimal Assistance - Patient > 75% ?LTG: Ambulation distance in controlled environment: 7f ?Goal: LTG Patient will ambulate in home environment (PT) ?Description: LTG: Patient will ambulate in home environment, # of feet with assistance (PT). ?Flowsheets (Taken 06/20/2021 0743) ?LTG: Pt will ambulate in home environ  assist needed:: Minimal Assistance - Patient > 75% ?LTG: Ambulation distance in home environment: 133f?  ?

## 2021-06-20 NOTE — Progress Notes (Signed)
Occupational Therapy Session Note ? ?Patient Details  ?Name: Eric Dennis ?MRN: [id number] ?Date of Birth: October 11, 1965 ? ?Today's Date: 06/20/2021 ?OT Individual Time: 2446-2863 ?OT Individual Time Calculation (min): 30 min  ? ? ?Short Term Goals: ?Week 1:  OT Short Term Goal 1 (Week 1): STGs=LTGs due to ELOS ? ?Skilled Therapeutic Interventions/Progress Updates:  ?  Pt received in bed with his wife in the room.  His BSC and transport w/c were delivered.  Recommended  she put them in the car so she has less to take home on discharge. Showed her how to use splash guard if using over the toilet. She left to do errands. ?Pt sat to EOB with Supervision.  Gave him a few options for todays treatment and he wanted to work in the room.   ?Using a 3 lb dowel bar, pt worked on sh and chest presses with cues for upright posture and lifted head.  Pt also worked on elbow flexion and extension exercises with mod encouragement to complete each exercises. ? ?Had pt work on sit to stand. His first 3 trials he could not lift up as he was not leaning forward.  Had to manually guide pt in forward trunk flexion in order for him to rise to stand.  Pt only tolerated standing for 20 seconds.  Pt stated " I am done for today, I have already done a lot".  Pt able to move back to supine with supervision. ? ?From supine, had pt work on hip bridges for leg strength and bent knee twists for back stretches.  Pt tolerated well. ? ?He needed urinal, assisted pt with urinal. ? ?Pt resting in bed with call light and phone in reach.  ? ?Therapy Documentation ?Precautions:  ?Precautions ?Precautions: Fall ?Precaution Comments: ETOH abuse, liver failure (not a transplant candidate), hx of multiple falls ?Restrictions ?Weight Bearing Restrictions: No ? ?  ?Vital Signs: ?Therapy Vitals ?Temp: 98 ?F (36.7 ?C) ?Temp Source: Oral ?Pulse Rate: 88 ?Resp: 16 ?BP: 125/61 ?Patient Position (if appropriate): Lying ?Oxygen Therapy ?SpO2: 97 % ?O2 Device: Room  Air ?Pain: ? No c/o pain  ?ADL: ?ADL ?Eating: Set up ?Where Assessed-Eating: Bed level ?Grooming: Contact guard ?Where Assessed-Grooming: Edge of bed ?Upper Body Bathing: Contact guard ?Where Assessed-Upper Body Bathing: Edge of bed ?Upper Body Dressing: Contact guard ?Where Assessed-Upper Body Dressing: Edge of bed ?Lower Body Dressing: Minimal assistance ?Where Assessed-Lower Body Dressing: Edge of bed ?Toileting: Dependent ?Where Assessed-Toileting: Bed level ? ? ?Therapy/Group: Individual Therapy ? ?Virgil ?06/20/2021, 8:56 AM ?

## 2021-06-20 NOTE — Discharge Instructions (Addendum)
Inpatient Rehab Discharge Instructions ? ?Eric Dennis ?Discharge date and time:  06/23/2021 ? ?Activities/Precautions/ Functional Status: ?Activity: no lifting, driving, or strenuous exercise for till cleared by MD ?Diet: diabetic diet ?Wound Care: none needed ? ? ?Functional status:  ?___ No restrictions     ___ Walk up steps independently ?___ 24/7 supervision/assistance   ___ Walk up steps with assistance ?__x_ Intermittent supervision/assistance  ___ Bathe/dress independently ?___ Walk with walker     ___ Bathe/dress with assistance ?___ Walk Independently    ___ Shower independently ?___ Walk with assistance    __x_ Shower with assistance ?_x__ No alcohol     ___ Return to work/school ________ ? ?Special Instructions: ? ? ? ?COMMUNITY REFERRALS UPON DISCHARGE:   ? ?Outpatient: PT ?            Agency: [facility] Phone: [phone] ?            Appointment Date/Time:WILL CALL TO SET UP APPOINTMENT ? ?Medical Equipment/Items Ordered:TRANSPORT CHAIR & 3 in 1 ?                                                Agency/Supplier:[facility]  [phone] ? ?PCS REFERRAL MADE WITH [employer]-FORM FAXED INTO THEM WILL FOLLOW UP WITH WIFE ? ?My questions have been answered and I understand these instructions. I will adhere to these goals and the provided educational materials after my discharge from the hospital. ? ?Patient/Caregiver Signature _______________________________ Date __________ ? ?Clinician Signature _______________________________________ Date __________ ? ?Please bring this form and your medication list with you to all your follow-up doctor's appointments.   ?

## 2021-06-20 NOTE — Progress Notes (Signed)
Physical Therapy Session Note ? ?Patient Details  ?Name: Eric Dennis ?MRN: [id number] ?Date of Birth: December 19, 1965 ? ?Today's Date: 06/20/2021 ?PT Individual Time: 0761-9155 ?PT Individual Time Calculation (min): 75 min  ? ?Short Term Goals: ?Week 1:  PT Short Term Goal 1 (Week 1): STG = LTG due to ELOS ? ?Skilled Therapeutic Interventions/Progress Updates:  ?   ?Pt presents sitting in w/c with wife at bedside - pt agreeable to PT tx and denies pain. Transported in w/c to ortho rehab gym for time. Wife present for the first part of the session for family training and education.  ? ?We completed ambulatory car transfer with car height simulating theirs - sit<>Stand to RW with CGA and ambulating ~17f to car with minA and RW - requires cues for safety approach and able to manage LE's in/out of car without assist. Wife completed ambulatory transfer back to his w/c ~161fproviding minA and RW - PT supervising for safety. Wife completed additional practice with providing minA with pt ambulating ~2072fnd PT providing w/c follow for safety. Wife does well with guarding and cueing patient to keep close to his walker.  ? ?Remaining time spent with PT only as wife needed to speak with CSW to order DME. ? ?Pt completed 5 min forwards direction + 5 min backwards direction of UE ergometer while seated in w/c, resistance set to 2.0. Required rest break b/w sets due to fatigue. ? ?Instructed in BLE strengthening exercises while seated in w/c: 2x10 LAQ with 2.5# ankle weights, 2x10 knee extension isometrics 3sec with 2.5# ankle weights, and 1x5 sit<>stand to RW from w/c level with CGA.  ? ?Transported back to room in w/c for energy conservation. Completed ambulatory transfer with minA and RW to bed. Bed mobility completed with supervision with HOB flat. Remained semi-reclined in bed, all needs met, wife at bedside. ? ? ? ?Therapy Documentation ?Precautions:  ?Precautions ?Precautions: Fall, Other (comment) ?Precaution Comments:  multiple falls at home ?Restrictions ?Weight Bearing Restrictions: No ?General: ?  ? ?Therapy/Group: Individual Therapy ? ?Eric Dennis ?06/20/2021, 7:30 AM  ?

## 2021-06-20 NOTE — Progress Notes (Signed)
Inpatient Rehabilitation Care Coordinator ?Discharge Note  ? ?Patient Details  ?Name: Eric Dennis ?MRN: [id number] ?Date of Birth: 09-25-1965 ? ? ?Discharge location: HOME WITH WIFE WHO CAN PROVIDE 24/7 CARE ? ?Length of Stay: 6 days ? ?Discharge activity level: SUPERVISION-CGA LEVEL ? ?Home/community participation: HOMEBODY ? ?Patient response ZJ:QBHALP Literacy - How often do you need to have someone help you when you read instructions, pamphlets, or other written material from your doctor or pharmacy?: Never ? ?Patient response FX:TKWIOX Isolation - How often do you feel lonely or isolated from those around you?: Sometimes ? ?Services provided included: MD, RD, PT, OT, SLP, RN, CM, Pharmacy, SW ? ?Financial Services:  ?Charity fundraiser Utilized: Medicaid ?  ? ?Choices offered to/list presented to: WIFE ? ?Follow-up services arranged:  ?Outpatient, DME, Patient/Family has no preference for HH/DME agencies ?   ?Outpatient Servicies: [facility] WILL CONTACT WIFE TO SET UP APPOINTMENT ?DME : [facility]-TRANSPORT CHAIR AND 3 IN 1 ?PCS REFERRAL MADE WITH [employer] WILL FOLLOW UP WITH WIFE. MEDICAID TRANSPORTATION INFORMATION GIVEN TO WIFE ?  ? ?Patient response to transportation need: ?Is the patient able to respond to transportation needs?: Yes ?In the past 12 months, has lack of transportation kept you from medical appointments or from getting medications?: Yes ?In the past 12 months, has lack of transportation kept you from meetings, work, or from getting things needed for daily living?: Yes ? ? ? ?Comments (or additional information):WIFE STAYED [facility] ATTENDED THERAPIES WITH. AWARE OF HIS CARE NEEDS. ? ?Patient/Family verbalized understanding of follow-up arrangements:  Yes ? ?Individual responsible for coordination of the follow-up plan: SAUNDRIA-WIFE [phone] ? ?Confirmed correct DME delivered: Elease Hashimoto 06/20/2021   ? ?Elease Hashimoto ?

## 2021-06-20 NOTE — Progress Notes (Signed)
?                                                       PROGRESS NOTE ? ? ?Subjective/Complaints: ?No new complaints this morning from patient or his wife.  ?Worked well with therapy today ?Discussed decrease in WBC, negative infectious workup ?Continuing with current plan for d/c ? ?ROS: +insomnia, +fatigue, denies pain ? ?Objective: ?  ?No results found. ?Recent Labs  ?  06/18/21 ?7681 06/19/21 ?1572  ?WBC 21.0* 18.9*  ?HGB 7.8* 7.1*  ?HCT 21.9* 19.6*  ?PLT 76* 78*  ? ?Recent Labs  ?  06/18/21 ?0541  ?NA 130*  ?K 4.0  ?CL 96*  ?CO2 27  ?GLUCOSE 116*  ?BUN 40*  ?CREATININE 1.17  ?CALCIUM 8.6*  ? ? ?Intake/Output Summary (Last 24 hours) at 06/20/2021 1625 ?Last data filed at 06/20/2021 1200 ?Gross per 24 hour  ?Intake 476 ml  ?Output --  ?Net 476 ml  ?  ? ?Pressure Injury 06/08/21 Elbow Left;Posterior DTI around elbow (Active)  ?06/08/21 1130  ?Location: Elbow  ?Location Orientation: Left;Posterior  ?Staging:   ?Wound Description (Comments): DTI around elbow  ?Present on Admission: Yes  ? ? ?Physical Exam: ?Vital Signs ?Blood pressure 120/67, pulse 92, temperature 98 ?F (36.7 ?C), temperature source Oral, resp. rate 17, height '6\' 4"'$  (1.93 m), weight 91.8 kg, SpO2 100 %. ?Gen: no distress, normal appearing, BMI 24.39, very fatigued ?HEENT: oral mucosa pink and moist, NCAT, jaundice ?Cardio: Reg rate ?Chest: normal effort, normal rate of breathing ?Abd: soft, non-distended ?Ext: Bilateral lower extremity edema ?Psych: pleasant, normal affect ?Skin: intact ?Neuro: Alert and oriented x3.  ?Musculoskeletal: 5/5 strength in upper extremities, 3/5 strength in lower extremities, limited by edema ?Ambulating MinA with RW ?Sit to stand CG. ? ? ?Assessment/Plan: ?1. Functional deficits which require 3+ hours per day of interdisciplinary therapy in a comprehensive inpatient rehab setting. ?Physiatrist is providing close team supervision and 24 hour management of active medical problems listed below. ?Physiatrist and rehab team  continue to assess barriers to discharge/monitor patient progress toward functional and medical goals ? ?Care Tool: ? ?Bathing ?   ?   ?   ?  ?  ?Bathing assist Assist Level: Minimal Assistance - Patient > 75% ?  ?  ?Upper Body Dressing/Undressing ?Upper body dressing   ?What is the patient wearing?: Pull over shirt ?   ?Upper body assist Assist Level: Contact Guard/Touching assist ?   ?Lower Body Dressing/Undressing ?Lower body dressing ? ? ?   ?What is the patient wearing?: Incontinence brief ? ?  ? ?Lower body assist Assist for lower body dressing: Minimal Assistance - Patient > 75% ?   ? ?Toileting ?Toileting    ?Toileting assist Assist for toileting: Dependent - Patient 0% ?  ?  ?Transfers ?Chair/bed transfer ? ?Transfers assist ?   ? ?Chair/bed transfer assist level: Minimal Assistance - Patient > 75% ?  ?  ?Locomotion ?Ambulation ? ? ?Ambulation assist ? ?   ? ?Assist level: Maximal Assistance - Patient 25 - 49% ?Assistive device: Walker-rolling ?Max distance: 47f  ? ?Walk 10 feet activity ? ? ?Assist ?   ? ?Assist level: Minimal Assistance - Patient > 75% ?Assistive device: Walker-rolling  ? ?Walk 50 feet activity ? ? ?Assist Walk 50 feet with 2 turns activity did not  occur: Safety/medical concerns ? ?  ?   ? ? ?Walk 150 feet activity ? ? ?Assist Walk 150 feet activity did not occur: Safety/medical concerns ? ?  ?  ?  ? ?Walk 10 feet on uneven surface  ?activity ? ? ?Assist Walk 10 feet on uneven surfaces activity did not occur: Safety/medical concerns ? ? ?  ?   ? ?Wheelchair ? ? ? ? ?Assist Is the patient using a wheelchair?: Yes ?Type of Wheelchair: Manual ?  ? ?Wheelchair assist level: Supervision/Verbal cueing ?Max wheelchair distance: 48f  ? ? ?Wheelchair 50 feet with 2 turns activity ? ? ? ?Assist ? ?  ?  ? ? ?Assist Level: Moderate Assistance - Patient 50 - 74%  ? ?Wheelchair 150 feet activity  ? ? ? ?Assist ?   ? ? ?Assist Level: Maximal Assistance - Patient 25 - 49%  ? ?Blood pressure 120/67,  pulse 92, temperature 98 ?F (36.7 ?C), temperature source Oral, resp. rate 17, height '6\' 4"'$  (1.93 m), weight 91.8 kg, SpO2 100 %. ? ? ? Medical Problem List and Plan: ?1. Functional deficits secondary to functional deficits secondary to decompensated liver failure, alcoholic cirrhosis/encephalopathy ?            -patient may shower ?            -ELOS/Goals: 6 days S ?            Continue CIR ?2.  Antithrombotics: ?-DVT/anticoagulation:  Mechanical:  Antiembolism stockings, knee (TED hose) Bilateral lower extremities ?            -antiplatelet therapy: none ?3. ?4. Mood: LCSW to evaluate and provide emotional support ?            -antipsychotic agents: na ?5. Neuropsych: This patient is capable of making decisions on his own behalf. ?6. Skin/Wound Care: Routine skin care checks ?7. Fluids/Electrolytes/Nutrition: Routine I's and O's and follow-up chemistries ?8: Cirrhosis/alcoholic hepatitis: ?            --continue prednisone to complete 28 day steroid treatment per GI ?            --continue rifaximin 550 mg BID ?            --continue lactulose 20 g BID (goal 3-4 BMs daily) ?            --continue thiamine supplementation ? Recommended milk thistle and dandelion root tea outpatient ?9: Hyperglycemia secondary to steroids: ?            --CBGs q AC and q HS ?            --Semeglee 15 units daily ?            --d/c SSI ?            --meal coverage Novolog 3 units TID q AC ?10: Esophageal varices recently banded: ?            --continue Protonix 40 mg daily ?            --monitor for GI bleeding ? -repeat Hgb tomorrow ?11: Thrombocytopenia: follow CBC ?--monitor for bleeding ?            --no pharmacologic DVT prophy ?AKI/HRS: improved; monitor BUN ?12: Hyponatremia: Liberalize diet to regular ?13: Elevated LFTs secondary to # 8: stable, monitor outpatient.  ?14: Hypoproteinemia  ?14: Leukocytosis: improving, procal 0.33. UA and BC with no growth ?            --  completed ceftriaxone for SBP ?15: Alcohol abuse: provide  cessation counseling ?16: Palliative care consult follow-up as outpatient ?17: Anemia, multifactorial: no active bleeding currently; repeat Hgb tomororw ?18. Lower extremity edema: place nursing order for leg elevation, compression garments, and ice prn ?19. Screening for vitamin D deficiency: vitamin D reviewed and stable ?20. Magnesium deficiency: magnesium reviewed and now stable ? ?LOS: ?3 days ?A FACE TO FACE EVALUATION WAS PERFORMED ? ?[name] P Aseel Tru[place]illo ?06/20/2021, 4:25 PM  ? ?  ?

## 2021-06-21 DIAGNOSIS — R739 Hyperglycemia, unspecified: Secondary | ICD-10-CM

## 2021-06-21 DIAGNOSIS — T380X5A Adverse effect of glucocorticoids and synthetic analogues, initial encounter: Secondary | ICD-10-CM

## 2021-06-21 DIAGNOSIS — D638 Anemia in other chronic diseases classified elsewhere: Secondary | ICD-10-CM

## 2021-06-21 DIAGNOSIS — D72829 Elevated white blood cell count, unspecified: Secondary | ICD-10-CM

## 2021-06-21 LAB — HEMOGLOBIN AND HEMATOCRIT, BLOOD
HCT: 21.2 % — ABNORMAL LOW (ref 39.0–52.0)
Hemoglobin: 7.5 g/dL — ABNORMAL LOW (ref 13.0–17.0)

## 2021-06-21 LAB — GLUCOSE, CAPILLARY
Glucose-Capillary: 93 mg/dL (ref 70–99)
Glucose-Capillary: 85 mg/dL (ref 70–99)
Glucose-Capillary: 132 mg/dL — ABNORMAL HIGH (ref 70–99)
Glucose-Capillary: 230 mg/dL — ABNORMAL HIGH (ref 70–99)

## 2021-06-21 NOTE — Progress Notes (Signed)
Pt reports no pain at this time, pt refused lactulose, educated pt on use and need for medication.  Was able to talk pt in to taking 1 28m dose. Pt incontinent of urine, pull bed linen change. All needs met, call bell in reach.    ?

## 2021-06-21 NOTE — Progress Notes (Signed)
+/-   sleep. Incontinent of B & B. Wife at bedside providing hands on assistance. BLE with 2 + pitting edema. MASD to bilateral buttocks, barrier cream applied. Patient refused scheduled lactulose. Right medial heel with DTI, foam dressing and prevalon boots applied. Bruising to LUE and Left flank. Abdomen distended and firm to touch. Eric Dennis  ?

## 2021-06-21 NOTE — Progress Notes (Signed)
?                                                       PROGRESS NOTE ? ? ?Subjective/Complaints: ?Patient seen sitting up in bed this AM.  He states he slept well overnight. He denies complaints.  ? ?ROS: Denies CP, SOB, N/V/D ? ?Objective: ?  ?No results found. ?Recent Labs  ?  06/19/21 ?1610 06/21/21 ?9604  ?WBC 18.9*  --   ?HGB 7.1* 7.5*  ?HCT 19.6* 21.2*  ?PLT 78*  --   ? ? ?No results for input(s): NA, K, CL, CO2, GLUCOSE, BUN, CREATININE, CALCIUM in the last 72 hours. ? ? ?Intake/Output Summary (Last 24 hours) at 06/21/2021 1103 ?Last data filed at 06/21/2021 0900 ?Gross per 24 hour  ?Intake 716 ml  ?Output --  ?Net 716 ml  ? ?  ? ?Pressure Injury 06/08/21 Elbow Left;Posterior DTI around elbow (Active)  ?06/08/21 1130  ?Location: Elbow  ?Location Orientation: Left;Posterior  ?Staging:   ?Wound Description (Comments): DTI around elbow  ?Present on Admission: Yes  ?   ?Pressure Injury 06/20/21 Heel Right;Medial Unstageable - Full thickness tissue loss in which the base of the injury is covered by slough (yellow, tan, gray, green or brown) and/or eschar (tan, brown or black) in the wound bed. black, circular (Active)  ?06/20/21 2032  ?Location: Heel  ?Location Orientation: Right;Medial  ?Staging: Unstageable - Full thickness tissue loss in which the base of the injury is covered by slough (yellow, tan, gray, green or brown) and/or eschar (tan, brown or black) in the wound bed.  ?Wound Description (Comments): black, circular  ?Present on Admission: No  ? ? ?Physical Exam: ?Vital Signs ?Blood pressure 122/62, pulse 89, temperature 97.7 ?F (36.5 ?C), temperature source Oral, resp. rate 14, height '6\' 4"'$  (1.93 m), weight 91.8 kg, SpO2 97 %. ?Gen: NAD  ?HEENT: oral mucosa pink and moist, NCAT, jaundice ?Cardio: Reg rate ?Chest: normal effort, normal rate of breathing ?Abd: Distended ?Ext: Bilateral lower extremity edema ?Psych: pleasant, flat affect ?Skin: intact ?Neuro: Alert and oriented x3.  ?Musculoskeletal: 5/5  strength in upper extremities, 3/5 strength in lower extremities, limited by edema, improving ? ?Assessment/Plan: ?1. Functional deficits which require 3+ hours per day of interdisciplinary therapy in a comprehensive inpatient rehab setting. ?Physiatrist is providing close team supervision and 24 hour management of active medical problems listed below. ?Physiatrist and rehab team continue to assess barriers to discharge/monitor patient progress toward functional and medical goals ? ?Care Tool: ? ?Bathing ?   ?   ?   ?  ?  ?Bathing assist Assist Level: Minimal Assistance - Patient > 75% ?  ?  ?Upper Body Dressing/Undressing ?Upper body dressing   ?What is the patient wearing?: Pull over shirt ?   ?Upper body assist Assist Level: Contact Guard/Touching assist ?   ?Lower Body Dressing/Undressing ?Lower body dressing ? ? ?   ?What is the patient wearing?: Incontinence brief ? ?  ? ?Lower body assist Assist for lower body dressing: Minimal Assistance - Patient > 75% ?   ? ?Toileting ?Toileting    ?Toileting assist Assist for toileting: Dependent - Patient 0% ?  ?  ?Transfers ?Chair/bed transfer ? ?Transfers assist ?   ? ?Chair/bed transfer assist level: Minimal Assistance - Patient > 75% ?  ?  ?Locomotion ?  Ambulation ? ? ?Ambulation assist ? ?   ? ?Assist level: Maximal Assistance - Patient 25 - 49% ?Assistive device: Walker-rolling ?Max distance: 37f  ? ?Walk 10 feet activity ? ? ?Assist ?   ? ?Assist level: Minimal Assistance - Patient > 75% ?Assistive device: Walker-rolling  ? ?Walk 50 feet activity ? ? ?Assist Walk 50 feet with 2 turns activity did not occur: Safety/medical concerns ? ?  ?   ? ? ?Walk 150 feet activity ? ? ?Assist Walk 150 feet activity did not occur: Safety/medical concerns ? ?  ?  ?  ? ?Walk 10 feet on uneven surface  ?activity ? ? ?Assist Walk 10 feet on uneven surfaces activity did not occur: Safety/medical concerns ? ? ?  ?   ? ?Wheelchair ? ? ? ? ?Assist Is the patient using a wheelchair?:  Yes ?Type of Wheelchair: Manual ?  ? ?Wheelchair assist level: Supervision/Verbal cueing ?Max wheelchair distance: 265f ? ? ?Wheelchair 50 feet with 2 turns activity ? ? ? ?Assist ? ?  ?  ? ? ?Assist Level: Moderate Assistance - Patient 50 - 74%  ? ?Wheelchair 150 feet activity  ? ? ? ?Assist ?   ? ? ?Assist Level: Maximal Assistance - Patient 25 - 49%  ? ?Blood pressure 122/62, pulse 89, temperature 97.7 ?F (36.5 ?C), temperature source Oral, resp. rate 14, height '6\' 4"'$  (1.93 m), weight 91.8 kg, SpO2 97 %. ? ? ? Medical Problem List and Plan: ?1. Functional deficits secondary to functional deficits secondary to decompensated liver failure, alcoholic cirrhosis/encephalopathy ? Continue CIR ?2.  Antithrombotics: ?-DVT/anticoagulation:  Mechanical:  Antiembolism stockings, knee (TED hose) Bilateral lower extremities ?            -antiplatelet therapy: none ?3. Pain: Prn meds ?4. Mood: LCSW to evaluate and provide emotional support ?            -antipsychotic agents: na ?5. Neuropsych: This patient is capable of making decisions on his own behalf. ?6. Skin/Wound Care: Routine skin care checks ?7. Fluids/Electrolytes/Nutrition: Routine I's and O's and follow-up chemistries ?8: Cirrhosis/alcoholic hepatitis: ?            --continue prednisone to complete 28 day steroid treatment per GI ?            --continue rifaximin 550 mg BID ?            --continue lactulose 20 g BID (goal 3-4 BMs daily) ?            --continue thiamine supplementation ? Recommended milk thistle and dandelion root tea outpatient ?9: Hyperglycemia secondary to steroids: ?            --CBGs q AC and q HS ?            --Semglee 15 units daily ?            --d/c SSI ?            --meal coverage Novolog 3 units TID q AC ? Labile on 4/15, monitor for trend ?10: Esophageal varices recently banded: ?            --continue Protonix 40 mg daily ?            --monitor for GI bleeding ? -repeat Hgb tomorrow ?11: Thrombocytopenia: follow CBC ?--monitor for  bleeding ?            --no pharmacologic DVT prophy ? Platelets 78 on 4/13, labs ordered for  Monday ?AKI/HRS: improved; monitor BUN ?12: Hyponatremia: Liberalize diet to regular ?13: Elevated LFTs secondary to # 8: stable, monitor outpatient.  ?14: Hypoproteinemia  ?14: Leukocytosis: improving, procal 0.33. UA and BC with no growth ?            --completed ceftriaxone for SBP ? WBCs 18.9 on 4/13, labs are pending ? Afebrile, no signs/symptoms of acute infection ?15: Alcohol abuse: provide cessation counseling ?16: Palliative care consult follow-up as outpatient ?17: Anemia, multifactorial: no active bleeding currently;  ? Hemoglobin 7.5 on 4/15, stable, bilirubin Monday ?18. Lower extremity edema: place nursing order for leg elevation, compression garments, and ice prn ?19. Screening for vitamin D deficiency: vitamin D reviewed and stable ?20. Magnesium deficiency: magnesium reviewed and now stable ? ?LOS: ?4 days ?A FACE TO FACE EVALUATION WAS PERFORMED ? ?Eric Dennis Lorie Phenix ?06/21/2021, 11:03 AM  ? ?  ?

## 2021-06-21 NOTE — Progress Notes (Addendum)
Physical Therapy Session Note ? ?Patient Details  ?Name: Eric Dennis ?MRN: [id number] ?Date of Birth: 01-May-1965 ? ?Today's Date: 06/21/2021 ?PT Individual Time: 6789-3810 ?PT Individual Time Calculation (min): 30 min  ? ?Short Term Goals: ?Week 1:  PT Short Term Goal 1 (Week 1): STG = LTG due to ELOS ? ?Skilled Therapeutic Interventions/Progress Updates:  ?Pt resting in bed; Prevalon boots donned.  He denied pain.  Pt reported that he needed to be changed.  Pt's brief as well as blankets were wet with urine; he stated that he tried to use urinal but it was difficult.  PT encouraged him to ask for assistance using urinal, and he agreed.  ? ?Prevalong boots doffed by PT.  Pt rolled L><R using bedrails with supervision to assist with peri care in bed.  He bridged up slightly to assist with removal of wet brief and placement of clean brief.   ?  ?Supine> sit with use of rails and close supervision, min cues.  From raised bed, sit> stand with min assist and mod cues for technique and hand placement.  Gait training with CGA in room x 10' to loveseat; stand> sit with min assist to this lower surface.  Pt rested briefly sitting on loveseat.  Sit> stand with use of bil armrests of loveseat, min assist.  Gait training x 25' to return to opposite side of bed, CGA.  Sit> supine with close supervision and min cues and encouragement. ? ? At end of session, pt resting in bed with alarm set and needs at hand; Prevalon boots donned. ?   ? ?Therapy Documentation ?Precautions:  ?Precautions ?Precautions: Fall ?Precaution Comments: ETOH abuse, liver failure (not a transplant candidate), hx of multiple falls ?Restrictions ?Weight Bearing Restrictions: No ? ?  ? ? ? ?Therapy/Group: Individual Therapy ? ?Valene Villa ?06/21/2021, 3:00 PM  ?

## 2021-06-22 LAB — GLUCOSE, CAPILLARY
Glucose-Capillary: 107 mg/dL — ABNORMAL HIGH (ref 70–99)
Glucose-Capillary: 126 mg/dL — ABNORMAL HIGH (ref 70–99)
Glucose-Capillary: 152 mg/dL — ABNORMAL HIGH (ref 70–99)
Glucose-Capillary: 177 mg/dL — ABNORMAL HIGH (ref 70–99)

## 2021-06-22 NOTE — Progress Notes (Signed)
Occupational Therapy Session Note ? ?Patient Details  ?Name: Eric Dennis ?MRN: [id number] ?Date of Birth: [birth year] ? ?Today's Date: 06/22/2021 ?OT Individual Time: 2025-4270 ?OT Individual Time Calculation (min): 55 min  ? ? ?Short Term Goals: ?Week 1:  OT Short Term Goal 1 (Week 1): STGs=LTGs due to ELOS ? ?Skilled Therapeutic Interventions/Progress Updates:  ?  Pt resting in bed upon arrival with RN and wife present. Pt's wife reported that pt had a rough night and didn't sleep much during the night. Pt states he just wants to go home and that's what he was thinking about. Max encouragement to participate. Pt incontinent of bladder. Supine>sit EOB with min A. Max verbal cues to move hips towareds EOB. Sit<>stand from EOB with CGA and max verbal cues after 3 attempts. Pt dependent for hygiene. STanding balance with CGA. Pt completed UB bathing seated EOB. UB dressing with supervision.Pt declined donning pants and returned to supine with supervision. Pt remained in bed with wife present. All needs within reach. Bed alarm activated. ?Therapy Documentation ?Precautions:  ?Precautions ?Precautions: Fall ?Precaution Comments: ETOH abuse, liver failure (not a transplant candidate), hx of multiple falls ?Restrictions ?Weight Bearing Restrictions: No ? ?Therapy/Group: Individual Therapy ? ?Leroy Libman ?06/22/2021, 10:03 AM ?

## 2021-06-22 NOTE — Progress Notes (Signed)
Speech Language Pathology Daily Session Note ? ?Patient Details  ?Name: Eric Dennis ?MRN: [id number] ?Date of Birth: November 22, 1965 ? ?Today's Date: 06/22/2021 ?SLP Individual Time: 3790-2409 ?SLP Individual Time Calculation (min): 40 min ? ?Short Term Goals: ?Week 1: SLP Short Term Goal 1 (Week 1): STG's = LTG's due to ELOS ? ?Skilled Therapeutic Interventions: ?Pt seen in room upright in bed. Wife present during session. She requested overview of medications and provided with list during session.Pt completed basic problem solving task with stating similarities between two items with mod A for attention. Pt very tired and faling asleep. Pt oriended to date andday of the week with no cues required. He was educated on speech intelligiblity strategies and demostrated [place] eof overarticulation to state single words included in categories with min A. Cont with therapy per plan of care.  ? ?Pain ?Pain Assessment ?Pain Scale: Faces ?Pain Score: 4  ?Faces Pain Scale: No hurt ? ?Therapy/Group: Individual Therapy ? ?Eric Dennis ?06/22/2021, 1:35 PM ?

## 2021-06-22 NOTE — Progress Notes (Signed)
Restless until 0130. Wanting to go home. Wife at bedside. PRN trazodone '25mg'$ 's given at 51. Refused scheduled lactulose. Incontinent and continent of urine, requiring assistance with urinal. Elevated heels off bed. Prevalon boots removed per patient's request. Reporting boots uncomfortable. Eric Dennis A  ?

## 2021-06-23 LAB — CULTURE, BLOOD (ROUTINE X 2)
Culture: NO GROWTH
Culture: NO GROWTH

## 2021-06-23 LAB — BASIC METABOLIC PANEL
Anion gap: 8 (ref 5–15)
BUN: 27 mg/dL — ABNORMAL HIGH (ref 6–20)
CO2: 25 mmol/L (ref 22–32)
Calcium: 8.1 mg/dL — ABNORMAL LOW (ref 8.9–10.3)
Chloride: 98 mmol/L (ref 98–111)
Creatinine, Ser: 1.27 mg/dL — ABNORMAL HIGH (ref 0.61–1.24)
GFR, Estimated: >60 mL/min (ref >60)
Glucose, Bld: 110 mg/dL — ABNORMAL HIGH (ref 70–99)
Potassium: 3.9 mmol/L (ref 3.5–5.1)
Sodium: 131 mmol/L — ABNORMAL LOW (ref 135–145)

## 2021-06-23 LAB — CBC
HCT: 20.4 % — ABNORMAL LOW (ref 39.0–52.0)
Hemoglobin: 7 g/dL — ABNORMAL LOW (ref 13.0–17.0)
MCH: 37.8 pg — ABNORMAL HIGH (ref 26.0–34.0)
MCHC: 34.3 g/dL (ref 30.0–36.0)
MCV: 110.3 fL — ABNORMAL HIGH (ref 80.0–100.0)
Platelets: 68 10*3/uL — ABNORMAL LOW (ref 150–400)
RBC: 1.85 MIL/uL — ABNORMAL LOW (ref 4.22–5.81)
WBC: 15 10*3/uL — ABNORMAL HIGH (ref 4.0–10.5)
nRBC: 0.2 % (ref 0.0–0.2)

## 2021-06-23 LAB — GLUCOSE, CAPILLARY: Glucose-Capillary: 102 mg/dL — ABNORMAL HIGH (ref 70–99)

## 2021-06-23 MED ORDER — PREDNISONE 20 MG PO TABS: 40.0000 mg | ORAL_TABLET | Freq: Every day | ORAL | 0 refills | Status: AC

## 2021-06-23 MED ORDER — FOLIC ACID 1 MG PO TABS: 1.0000 mg | ORAL_TABLET | Freq: Every day | ORAL | 0 refills | Status: DC

## 2021-06-23 MED ORDER — INSULIN GLARGINE 100 UNIT/ML SOLOSTAR PEN: 15.0000 [IU] | PEN_INJECTOR | Freq: Every day | SUBCUTANEOUS | 0 refills | Status: DC

## 2021-06-23 MED ORDER — INSULIN ASPART 100 UNIT/ML FLEXPEN
3.0000 [IU] | PEN_INJECTOR | Freq: Three times a day (TID) | SUBCUTANEOUS | 0 refills | Status: DC
Start: 2021-06-23 — End: 2021-07-25

## 2021-06-23 MED ORDER — THIAMINE HCL 100 MG PO TABS: 100.0000 mg | ORAL_TABLET | Freq: Every day | ORAL | 0 refills | Status: DC

## 2021-06-23 MED ORDER — BLOOD GLUCOSE METER KIT: PACK | 0 refills | Status: DC

## 2021-06-23 MED ORDER — PANTOPRAZOLE SODIUM 40 MG PO TBEC: 40.0000 mg | DELAYED_RELEASE_TABLET | Freq: Every day | ORAL | 0 refills | Status: DC

## 2021-06-23 MED ORDER — RIFAXIMIN 550 MG PO TABS: 550.0000 mg | ORAL_TABLET | Freq: Two times a day (BID) | ORAL | 0 refills | Status: DC

## 2021-06-23 MED ORDER — PREDNISONE 10 MG PO TABS: ORAL_TABLET | ORAL | 0 refills | Status: DC

## 2021-06-23 MED ORDER — TRAZODONE HCL 50 MG PO TABS: 25.0000 mg | ORAL_TABLET | Freq: Every evening | ORAL | 0 refills | Status: DC | PRN

## 2021-06-23 MED ORDER — "PEN NEEDLES 3/16"" 31G X 5 MM MISC": 1.0000 | Freq: Four times a day (QID) | 1 refills | Status: DC

## 2021-06-23 MED ORDER — GERHARDT'S BUTT CREAM: 1.0000 "application " | TOPICAL_CREAM | Freq: Four times a day (QID) | CUTANEOUS | 0 refills | Status: DC

## 2021-06-23 MED ORDER — LACTULOSE 10 GM/15ML PO SOLN: 10.0000 g | Freq: Two times a day (BID) | ORAL | 0 refills | Status: DC

## 2021-06-23 MED ORDER — ADULT MULTIVITAMIN W/MINERALS CH: 1.0000 | ORAL_TABLET | Freq: Every day | ORAL | 0 refills | Status: DC

## 2021-06-23 NOTE — Progress Notes (Signed)
Incontinent of B & B at start of shift. Requires total assist for hygiene and linen change. Wife very helpful and hands on. PRN trazodone '50mg'$  given at 2008. Denies pain. Abd. Distented. Continues to refuse scheduled lactulose. Prevalon boots applied. Right heel with foam dressing. Patrici Ranks A  ?

## 2021-06-23 NOTE — Progress Notes (Signed)
?                                                       PROGRESS NOTE ? ? ?Subjective/Complaints: ?No new complaints this morning.  ?Discussed with wife that Creatinine is elevated- recommend 6-8 glasses of water per day, repeating outpatient.  ? ?ROS: Denies CP, SOB, N/V/D ? ?Objective: ?  ?No results found. ?Recent Labs  ?  06/21/21 ?3794 06/23/21 ?3276  ?WBC  --  15.0*  ?HGB 7.5* 7.0*  ?HCT 21.2* 20.4*  ?PLT  --  68*  ? ?Recent Labs  ?  06/23/21 ?1470  ?NA 131*  ?K 3.9  ?CL 98  ?CO2 25  ?GLUCOSE 110*  ?BUN 27*  ?CREATININE 1.27*  ?CALCIUM 8.1*  ? ? ?Intake/Output Summary (Last 24 hours) at 06/23/2021 0921 ?Last data filed at 06/23/2021 0700 ?Gross per 24 hour  ?Intake 250 ml  ?Output --  ?Net 250 ml  ?  ? ?Pressure Injury 06/08/21 Elbow Left;Posterior DTI around elbow (Active)  ?06/08/21 1130  ?Location: Elbow  ?Location Orientation: Left;Posterior  ?Staging:   ?Wound Description (Comments): DTI around elbow  ?Present on Admission: Yes  ?   ?Pressure Injury 06/20/21 Heel Right;Medial Unstageable - Full thickness tissue loss in which the base of the injury is covered by slough (yellow, tan, gray, green or brown) and/or eschar (tan, brown or black) in the wound bed. black, circular (Active)  ?06/20/21 2032  ?Location: Heel  ?Location Orientation: Right;Medial  ?Staging: Unstageable - Full thickness tissue loss in which the base of the injury is covered by slough (yellow, tan, gray, green or brown) and/or eschar (tan, brown or black) in the wound bed.  ?Wound Description (Comments): black, circular  ?Present on Admission: No  ? ? ?Physical Exam: ?Vital Signs ?Blood pressure (!) 119/58, pulse 94, temperature 98.3 ?F (36.8 ?C), resp. rate 14, height '6\' 4"'$  (1.93 m), weight 91.8 kg, SpO2 94 %. ?Gen: no distress, normal appearing ?HEENT: oral mucosa pink and moist, NCAT ?Cardio: Reg rate ?Chest: normal effort, normal rate of breathing ?Abd: soft, non-distended ?Ext: Bilateral lower extremity edema ?Psych: pleasant, flat  affect ?Skin: intact ?Neuro: Alert and oriented x3.  ?Musculoskeletal: 5/5 strength in upper extremities, 3/5 strength in lower extremities, limited by edema, improving ? ?Assessment/Plan: ?1. Functional deficits which require 3+ hours per day of interdisciplinary therapy in a comprehensive inpatient rehab setting. ?Physiatrist is providing close team supervision and 24 hour management of active medical problems listed below. ?Physiatrist and rehab team continue to assess barriers to discharge/monitor patient progress toward functional and medical goals ? ?Care Tool: ? ?Bathing ?   ?Body parts bathed by patient: Right arm, Left arm, Chest, Abdomen, Front perineal area, Buttocks, Right upper leg, Left upper leg, Right lower leg, Left lower leg, Face  ?   ?  ?  ?Bathing assist Assist Level: Contact Guard/Touching assist ?  ?  ?Upper Body Dressing/Undressing ?Upper body dressing   ?What is the patient wearing?: Pull over shirt ?   ?Upper body assist Assist Level: Supervision/Verbal cueing ?   ?Lower Body Dressing/Undressing ?Lower body dressing ? ? ?   ?What is the patient wearing?: Incontinence brief, Pants ? ?  ? ?Lower body assist Assist for lower body dressing: Minimal Assistance - Patient > 75% ?   ? ?Toileting ?Toileting    ?  Toileting assist Assist for toileting: Dependent - Patient 0% ?  ?  ?Transfers ?Chair/bed transfer ? ?Transfers assist ?   ? ?Chair/bed transfer assist level: Minimal Assistance - Patient > 75% ?  ?  ?Locomotion ?Ambulation ? ? ?Ambulation assist ? ?   ? ?Assist level: Contact Guard/Touching assist ?Assistive device: Walker-rolling ?Max distance: 25  ? ?Walk 10 feet activity ? ? ?Assist ?   ? ?Assist level: Contact Guard/Touching assist ?Assistive device: Walker-rolling  ? ?Walk 50 feet activity ? ? ?Assist Walk 50 feet with 2 turns activity did not occur: Safety/medical concerns (fatigue) ? ?  ?   ? ? ?Walk 150 feet activity ? ? ?Assist Walk 150 feet activity did not occur: Safety/medical  concerns (fatigue) ? ?  ?  ?  ? ?Walk 10 feet on uneven surface  ?activity ? ? ?Assist Walk 10 feet on uneven surfaces activity did not occur: Safety/medical concerns (fatigue) ? ? ?  ?   ? ?Wheelchair ? ? ? ? ?Assist Is the patient using a wheelchair?: Yes ?Type of Wheelchair: Manual ?Wheelchair activity did not occur: Refused (fatigue) ? ?Wheelchair assist level: Supervision/Verbal cueing ?Max wheelchair distance: 25  ? ? ?Wheelchair 50 feet with 2 turns activity ? ? ? ?Assist ? ?  ?  ? ? ?Assist Level: Moderate Assistance - Patient 50 - 74%  ? ?Wheelchair 150 feet activity  ? ? ? ?Assist ?   ? ? ?Assist Level: Maximal Assistance - Patient 25 - 49%  ? ?Blood pressure (!) 119/58, pulse 94, temperature 98.3 ?F (36.8 ?C), resp. rate 14, height '6\' 4"'$  (1.93 m), weight 91.8 kg, SpO2 94 %. ? ? ? Medical Problem List and Plan: ?1. Functional deficits secondary to functional deficits secondary to decompensated liver failure, alcoholic cirrhosis/encephalopathy ? D/c home ?2.  Antithrombotics: ?-DVT/anticoagulation:  Mechanical:  Antiembolism stockings, knee (TED hose) Bilateral lower extremities ?            -antiplatelet therapy: none ?3. Pain: Prn meds ?4. Mood: LCSW to evaluate and provide emotional support ?            -antipsychotic agents: na ?5. Neuropsych: This patient is capable of making decisions on his own behalf. ?6. Skin/Wound Care: Routine skin care checks ?7. Fluids/Electrolytes/Nutrition: Routine I's and O's and follow-up chemistries ?8: Cirrhosis/alcoholic hepatitis: ?            --continue prednisone to complete 28 day steroid treatment per GI ?            --continue rifaximin 550 mg BID ?            --continue lactulose 20 g BID (goal 3-4 BMs daily) ?            --continue thiamine supplementation ? Recommended milk thistle and dandelion root tea outpatient ?9: Hyperglycemia secondary to steroids: ?            --CBGs q AC and q HS ?            --Semglee 15 units daily ?            --d/c SSI ?             --meal coverage Novolog 3 units TID q AC ? Labile on 4/15, monitor for trend ?10: Esophageal varices recently banded: ?            --continue Protonix 40 mg daily ?            --monitor for  GI bleeding ? -discussed decrease in Hgb to 7, repeating Hgb outpatient.  ?11: Thrombocytopenia: follow CBC ?--monitor for bleeding ?            --no pharmacologic DVT prophy ? Platelets 78 on 4/13, labs ordered for Monday ?AKI/HRS: improved; monitor BUN ?12: Hyponatremia: Liberalize diet to regular ?13: Elevated LFTs secondary to # 8: stable, monitor outpatient. Discussed scheduling follow-up with GI ?14: Hypoproteinemia  ?14: Leukocytosis: improving, procal 0.33. UA and BC with no growth ?            --completed ceftriaxone for SBP ? WBCs 18.9 on 4/13, labs are pending ? Afebrile, no signs/symptoms of acute infection ?15: Alcohol abuse: provide cessation counseling ?16: Palliative care consult follow-up as outpatient ?17: Anemia, multifactorial: no active bleeding currently;  ? Hemoglobin 7.5 on 4/15, stable, bilirubin Monday ?18. Lower extremity edema: place nursing order for leg elevation, compression garments, and ice prn ?19. Screening for vitamin D deficiency: vitamin D reviewed and stable ?20. Magnesium deficiency: magnesium reviewed and now stable ?21. AKI: discussed elevated creatinine with patient and wife, recommended 6-8 glasses of water per day.  ? ? >30 minutes spent in discharge of patient including review of medications and follow-up appointments, physical examination, and in answering all patient's questions  ? ?LOS: ?6 days ?A FACE TO FACE EVALUATION WAS PERFORMED ? ?Eric Dennis Eric Dennis ?06/23/2021, 9:21 AM  ? ?  ?

## 2021-06-23 NOTE — Progress Notes (Signed)
Patient discharged home with wife, all belongings and equipment sent home. Insulin and glucose testing education provided to wife. Escorted out by NT ?

## 2021-06-23 NOTE — Progress Notes (Signed)
Inpatient Rehabilitation Discharge Medication Review by a Pharmacist ? ?A complete drug regimen review was completed for this patient to identify any potential clinically significant medication issues. ? ?High Risk Drug Classes Is patient taking? Indication by Medication  ?Antipsychotic No   ?Anticoagulant No   ?Antibiotic No   ?Opioid No   ?Antiplatelet No   ?Hypoglycemics/insulin Yes Insulin for steroid induced hyperglycemia   ?Vasoactive Medication No   ?Chemotherapy No   ?Other No   ? ? ? ?Type of Medication Issue Identified Description of Issue Recommendation(s)  ?Drug Interaction(s) (clinically significant) ?    ?Duplicate Therapy ?    ?Allergy ?    ?No Medication Administration End Date ?    ?Incorrect Dose ?    ?Additional Drug Therapy Needed ? Prednisone taper needed after 28 day course, will likely need insulin adjustment ?Pen-needles not sent with insulin pens ?Glucometer not sent  Discussed with MD, taper Rx sent to pharmacy, MD to adjust insulin outpatient ?Rx sent  ?Rx sent   ?Significant med changes from prior encounter (inform family/care partners about these prior to discharge). New butt cream, insulin aspart, insulin glargine, lactulose, multivitamin, prednisone with taper, rifaximin, trazodone ?Stop Tums, vitamin D, furosemide, ibuprofen, potassium spironolactone  RN to educate on insulin administration and glucometer use ? ?Educate patient on medication changes   ?Other ?    ? ? ?Clinically significant medication issues were identified that warrant physician communication and completion of prescribed/recommended actions by midnight of the next day:  Yes and resolved per documentation above  ? ?Name of provider notified for urgent issues identified: Dr. Ranell Patrick ? ?Provider Method of Notification: Secure chat  ? ? ? ?Pharmacist comments:  ? ?Time spent performing this drug regimen review (minutes):  30 ? ?Benetta Spar, PharmD, BCPS, BCCP ?Clinical Pharmacist ? ?Please check AMION for all [place]  phone numbers ?After 10:00 PM, call [place] [phone] ? ? ?

## 2021-06-25 NOTE — Progress Notes (Signed)
Speech Language Pathology Discharge Summary ? ?Patient Details  ?Name: Eric Dennis ?MRN: [id number] ?Date of Birth: [birth year] ? ?Patient has met 2 of 3 long term goals.  Patient to discharge at overall Supervision;Min level.  ? ?Reasons goals not met: Patient continues to require Min verbal cues for use of speech intelligibility strategies.  ? ?Clinical Impression/Discharge Summary: Patient has made functional gains and has met 2 of 3 LTGs this admission. Currently, patient requires overall Min A multimodal cues for use of speech intelligibility strategies at the sentence level. Patient demonstrates improved cognitive functioning and requires overall supervision-Min A verbal cues for orientation with use of external aids and functional problem solving. Patient and family education is complete and patient will discharge home with 24 hour supervision from family. Patient would benefit from f/u SLP services to maximize his cognitive functioning and speech intelligibility in order to reduce caregiver burden.  ? ?Care Partner:  ?Caregiver Able to Provide Assistance: Yes  ?Type of Caregiver Assistance: Physical;Cognitive ? ?Recommendation:  ?Outpatient SLP;24 hour supervision/assistance  ?Rationale for SLP Follow Up: Reduce caregiver burden;Maximize functional communication;Maximize cognitive function and independence  ? ?Equipment: N/A  ? ?Reasons for discharge: Discharged from hospital  ? ?Patient/Family Agrees with Progress Made and Goals Achieved: Yes  ? ? ?Gracen Ringwald ?06/25/2021, 2:06 PM ? ?

## 2021-06-25 NOTE — Plan of Care (Signed)
?  Problem: RH Expression Communication ?Goal: LTG Patient will increase speech intelligibility (SLP) ?Description: LTG: Patient will increase speech intelligibility at word/phrase/conversation level with cues, % of the time (SLP) ?Outcome: Not Met (add Reason) ?Note: Patient requires Min verbal cues for use of speech intelligibility strategies  ?  ?Problem: RH Cognition - SLP ?Goal: RH LTG Patient will demonstrate orientation with cues ?Description:  LTG:  Patient will demonstrate orientation to person/place/time/situation with cues (SLP)   ?Outcome: Completed/Met ?  ?Problem: RH Problem Solving ?Goal: LTG Patient will demonstrate problem solving for (SLP) ?Description: LTG:  Patient will demonstrate problem solving for basic/complex daily situations with cues  (SLP) ?Outcome: Completed/Met ?  ?

## 2021-07-03 ENCOUNTER — Encounter
Payer: Medicaid Other | Attending: Physical Medicine and Rehabilitation | Admitting: Physical Medicine and Rehabilitation

## 2021-07-03 ENCOUNTER — Telehealth: Payer: Self-pay

## 2021-07-03 DIAGNOSIS — Z7409 Other reduced mobility: Secondary | ICD-10-CM

## 2021-07-03 DIAGNOSIS — Z789 Other specified health status: Secondary | ICD-10-CM

## 2021-07-03 NOTE — Telephone Encounter (Signed)
Patient's wife is calling stating he has not received a bed after 10 days of being discharged from the hospital. Patient's wife is calling stating the patient is in need of a bed and needs in home therapy. He was discharged from the hospital on 06/23/21. She stated she is unable to get him out of the bed and can only turn him. She is paying out of pocket for all his supplies and wants to know if that can be handled by home health. She stated he did get the transfer chair from [facility] ?

## 2021-07-04 NOTE — Progress Notes (Signed)
? ?  Subjective:  ? ? Patient ID: KABIR BRANNOCK, male    DOB: Sep 19, 1965, 56 y.o.   MRN: [id number] ? ?HPI ?An audio/video tele-health visit is felt to be the most appropriate encounter for this patient at this time. This is a follow up tele-visit via phone. The patient is at home. MD is at office. Prior to scheduling this appointment, our staff discussed the limitations of evaluation and management by telemedicine and the availability of in-person appointments. The patient expressed understanding and agreed to proceed.  ? ?1) Impaired mobility and ADLs ?-Mr. Corliss's wife notes that he has been not able to get out of bed due to his weight and the amount of assist he requires and that she is not able to safely provide ?-she asks about home therapy. ?-she notes that she has been unable to afford his extra large briefs, bed pads, gloves, wipes, and Gerrhard'st butt cream so friends and family have been chipping in to help support her.  ? ? ? ?Review of Systems ?+impaired mobility and ADLs ?   ?Objective:  ? Physical Exam ?Not performed ? ? ? ?   ?Assessment & Plan:  ? ?Mr. Hooped is a 56 year old man who was recently admitted to CIR with debility, his wife contacts with questions regarding supplies and home therapy.  ? ?1) Impaired mobility and ADLs ?-Discussed with wife that I would contact his social worker regarding the extra large briefs, bed pads, gloves, wipes, and Gerrhardt's butt cream she needs to see if we could get this covered for them ?-Discussed with SW Ovidio Kin and she has ordered hospital bed for aptient and referred to [facility] for briefs and noted that both vendors would be reaching out to wife to discuss delivery of these items ?-discussed with wife that unfortunately none of the home agencies were willing to accept their Medicaid ? ?5 minutes spent in discussing the materials that the patient needs, his decline in function and inability to ambulate due to weight and wife's limited ability to  assist him, discussing with wife that we would try to get continence supplied covered for him ?

## 2021-07-10 ENCOUNTER — Telehealth: Payer: Self-pay | Admitting: Nurse Practitioner

## 2021-07-10 ENCOUNTER — Other Ambulatory Visit: Payer: Medicaid Other | Admitting: Nurse Practitioner

## 2021-07-10 NOTE — Telephone Encounter (Signed)
I called Eric Dennis for initial pc consult by telephone as video not available. Eric Dennis endorses they are on their way driving to another appointment, will need to rescheduled.  ?

## 2021-07-10 NOTE — Telephone Encounter (Signed)
Spoke with patient's wife Barrett Shell regarding the Palliative referral/services and all questions were answered and she was in agreement with scheduling visit.  I have scheduled a Telehalth Palliative Consult for 07/10/21 @ 1 PM.   ?

## 2021-07-15 ENCOUNTER — Emergency Department (HOSPITAL_COMMUNITY)
Admission: EM | Admit: 2021-07-15 | Discharge: 2021-07-15 | Disposition: A | Discharge status: Home / Self Care | Payer: Medicaid Other | Attending: Emergency Medicine | Admitting: Emergency Medicine

## 2021-07-15 ENCOUNTER — Encounter (HOSPITAL_COMMUNITY): Payer: Self-pay

## 2021-07-15 ENCOUNTER — Other Ambulatory Visit: Payer: Self-pay

## 2021-07-15 DIAGNOSIS — Z79899 Other long term (current) drug therapy: Secondary | ICD-10-CM | POA: Diagnosis not present

## 2021-07-15 DIAGNOSIS — E7220 Disorder of urea cycle metabolism, unspecified: Secondary | ICD-10-CM | POA: Diagnosis not present

## 2021-07-15 DIAGNOSIS — G9340 Encephalopathy, unspecified: Secondary | ICD-10-CM

## 2021-07-15 DIAGNOSIS — R4182 Altered mental status, unspecified: Secondary | ICD-10-CM | POA: Diagnosis present

## 2021-07-15 DIAGNOSIS — Z794 Long term (current) use of insulin: Secondary | ICD-10-CM | POA: Diagnosis not present

## 2021-07-15 LAB — CBC WITH DIFFERENTIAL/PLATELET
Abs Immature Granulocytes: 0.05 10*3/uL (ref 0.00–0.07)
Basophils Absolute: 0 10*3/uL (ref 0.0–0.1)
Basophils Relative: 0 %
Eosinophils Absolute: 0 10*3/uL (ref 0.0–0.5)
Eosinophils Relative: 0 %
HCT: 31.1 % — ABNORMAL LOW (ref 39.0–52.0)
Hemoglobin: 11.1 g/dL — ABNORMAL LOW (ref 13.0–17.0)
Immature Granulocytes: 1 %
Lymphocytes Relative: 7 %
Lymphs Abs: 0.5 10*3/uL — ABNORMAL LOW (ref 0.7–4.0)
MCH: 43.4 pg — ABNORMAL HIGH (ref 26.0–34.0)
MCHC: 35.7 g/dL (ref 30.0–36.0)
MCV: 121.5 fL — ABNORMAL HIGH (ref 80.0–100.0)
Monocytes Absolute: 0.6 10*3/uL (ref 0.1–1.0)
Monocytes Relative: 9 %
Neutro Abs: 6.2 10*3/uL (ref 1.7–7.7)
Neutrophils Relative %: 83 %
Platelets: 73 10*3/uL — ABNORMAL LOW (ref 150–400)
RBC: 2.56 MIL/uL — ABNORMAL LOW (ref 4.22–5.81)
RDW: 13.9 % (ref 11.5–15.5)
WBC: 7.4 10*3/uL (ref 4.0–10.5)
nRBC: 0 % (ref 0.0–0.2)

## 2021-07-15 LAB — I-STAT CHEM 8, ED
BUN: 14 mg/dL (ref 6–20)
Calcium, Ion: 1.1 mmol/L — ABNORMAL LOW (ref 1.15–1.40)
Chloride: 95 mmol/L — ABNORMAL LOW (ref 98–111)
Creatinine, Ser: 0.7 mg/dL (ref 0.61–1.24)
Glucose, Bld: 149 mg/dL — ABNORMAL HIGH (ref 70–99)
HCT: 33 % — ABNORMAL LOW (ref 39.0–52.0)
Hemoglobin: 11.2 g/dL — ABNORMAL LOW (ref 13.0–17.0)
Potassium: 3.8 mmol/L (ref 3.5–5.1)
Sodium: 134 mmol/L — ABNORMAL LOW (ref 135–145)
TCO2: 28 mmol/L (ref 22–32)

## 2021-07-15 LAB — COMPREHENSIVE METABOLIC PANEL
ALT: 91 U/L — ABNORMAL HIGH (ref 0–44)
AST: 61 U/L — ABNORMAL HIGH (ref 15–41)
Albumin: 2.5 g/dL — ABNORMAL LOW (ref 3.5–5.0)
Alkaline Phosphatase: 135 U/L — ABNORMAL HIGH (ref 38–126)
Anion gap: 8 (ref 5–15)
BUN: 15 mg/dL (ref 6–20)
CO2: 28 mmol/L (ref 22–32)
Calcium: 9 mg/dL (ref 8.9–10.3)
Chloride: 98 mmol/L (ref 98–111)
Creatinine, Ser: 0.47 mg/dL — ABNORMAL LOW (ref 0.61–1.24)
GFR, Estimated: >60 mL/min (ref >60)
Glucose, Bld: 149 mg/dL — ABNORMAL HIGH (ref 70–99)
Potassium: 3.8 mmol/L (ref 3.5–5.1)
Sodium: 134 mmol/L — ABNORMAL LOW (ref 135–145)
Total Bilirubin: 19.6 mg/dL (ref 0.3–1.2)
Total Protein: 6 g/dL — ABNORMAL LOW (ref 6.5–8.1)

## 2021-07-15 LAB — LACTIC ACID, PLASMA: Lactic Acid, Venous: 1.9 mmol/L (ref 0.5–1.9)

## 2021-07-15 LAB — LIPASE, BLOOD: Lipase: 127 U/L — ABNORMAL HIGH (ref 11–51)

## 2021-07-15 LAB — MAGNESIUM: Magnesium: 1.5 mg/dL — ABNORMAL LOW (ref 1.7–2.4)

## 2021-07-15 LAB — ETHANOL: Alcohol, Ethyl (B): <10 mg/dL (ref <10)

## 2021-07-15 LAB — AMMONIA: Ammonia: 83 umol/L — ABNORMAL HIGH (ref 9–35)

## 2021-07-15 MED ORDER — LACTULOSE 10 GM/15ML PO SOLN
30.0000 g | Freq: Once | ORAL | Status: AC
  Administered 2021-07-15: 30 g via ORAL
  Filled 2021-07-15: qty 60

## 2021-07-15 MED ORDER — LACTULOSE 10 GM/15ML PO SOLN: 40.0000 g | Freq: Three times a day (TID) | ORAL | 0 refills | Status: AC

## 2021-07-15 NOTE — ED Notes (Signed)
TOC consulted for HH/DME needs. CSW reached out to [facility] with [facility] who states they can accept pt for [facility] PT services. CSW spoke with pts wife in room about this and she is agreeable. CSW also spoke with wife about getting pt incontinence supplies through [place]. Pts wife is agreeable for this. CSW spoke to [place] with [employer] who states that they can get supplies ordered for pt. CSW sent referral to [facility] via fax. TOC singing off.  ?

## 2021-07-15 NOTE — ED Triage Notes (Signed)
Patient via EMS from home due to being lethargic and decreased PO intake since last night.  ?

## 2021-07-15 NOTE — ED Notes (Signed)
EMS called for transport at this time.

## 2021-07-15 NOTE — Discharge Instructions (Signed)
As discussed, with your husband's liver failure it is important to monitor his conditions carefully and follow-up with Dr. Deno Etienne.  Please take the adjusted dose of lactulose and discussed this with your gastroenterologist. ? ?Return here for concerning changes in your condition. ?

## 2021-07-15 NOTE — ED Provider Notes (Signed)
?[facility] ?Provider Note ? ? ?CSN: [id number] ?Arrival date & time: 07/15/21  1253 ? ?  ? ?History ? ?No chief complaint on file. ? ? ?VISENTE KIRKER is a 56 y.o. male. ? ?HPI ?Patient presents via EMS due to concern for altered mental status.  Patient has a history of cirrhosis, arrives from home, after report of altered mental status.  Patient is awake, offers very softly worded replies, denies pain, denies dyspnea, denies fall.  Seemingly he is taking his medication as directed.  On chart review and the patient was discharged about a month ago, with plans for home health. ?  ? ?Home Medications ?Prior to Admission medications   ?Medication Sig Start Date End Date Taking? Authorizing Provider  ?blood glucose meter kit and supplies Dispense based on patient and insurance preference. Use up to four times daily as directed. (FOR ICD-10 E10.9, E11.9). 06/23/21   Setzer, Edman Circle, PA-C  ?folic acid (FOLVITE) 1 MG tablet Take 1 tablet (1 mg total) by mouth daily. 06/23/21   Setzer, Edman Circle, PA-C  ?insulin aspart (NOVOLOG) 100 UNIT/ML FlexPen Inject 3 Units into the skin 3 (three) times daily with meals. Do not give if fingerstick blood sugar is less than 80 or if you do not eat at least 50% of your meal 06/23/21   Setzer, Edman Circle, PA-C  ?insulin glargine (LANTUS) 100 UNIT/ML Solostar Pen Inject 15 Units into the skin daily. 06/23/21   Setzer, Edman Circle, PA-C  ?Insulin Pen Needle (PEN NEEDLES 3/16") 31G X 5 MM MISC 1 each by Does not apply route 4 (four) times daily. Use to inject insulin 06/23/21   Raulkar, Clide Deutscher, MD  ?lactulose (CHRONULAC) 10 GM/15ML solution Take 15 mLs (10 g total) by mouth 2 (two) times daily. 06/23/21   Setzer, Edman Circle, PA-C  ?Multiple Vitamin (MULTIVITAMIN WITH MINERALS) TABS tablet Take 1 tablet by mouth daily. 06/23/21   Setzer, Edman Circle, PA-C  ?Nystatin (GERHARDT'S BUTT CREAM) CREA Apply 1 application. topically 4 (four) times daily. 06/23/21   Setzer, Edman Circle, PA-C   ?pantoprazole (PROTONIX) 40 MG tablet Take 1 tablet (40 mg total) by mouth daily. 06/23/21 08/22/21  Setzer, Edman Circle, PA-C  ?predniSONE (DELTASONE) 10 MG tablet START Taper dose ON 5/6  ?5/6 - 5/8: Take 30 mg (3 tablets) daily ?5/9 - 5/11: Take 20 mg (2 tablets) daily ?5/12 - 5/14: Take 10 mg (1 tablet) daily  ?5/15 - 5/17: Take 5 mg (half tablet) daily  ?5/18 stop prednisone 07/12/21 07/23/21  Raulkar, Clide Deutscher, MD  ?rifaximin (XIFAXAN) 550 MG TABS tablet Take 1 tablet (550 mg total) by mouth 2 (two) times daily. 06/23/21   Setzer, Edman Circle, PA-C  ?thiamine 100 MG tablet Take 1 tablet (100 mg total) by mouth daily. 06/23/21   Setzer, Edman Circle, PA-C  ?traZODone (DESYREL) 50 MG tablet Take 0.5-1 tablets (25-50 mg total) by mouth at bedtime as needed for sleep. 06/23/21   Setzer, Edman Circle, PA-C  ?   ? ?Allergies    ?Patient has no known allergies.   ? ?Review of Systems   ?Review of Systems  ?Unable to perform ROS: Acuity of condition  ? ?Physical Exam ?Updated Vital Signs ?There were no vitals taken for this visit. ?Physical Exam ?Vitals and nursing note reviewed.  ?Constitutional:   ?   General: He is not in acute distress. ?   Appearance: He is well-developed.  ?   Comments: Ill, thin, withdrawn adult male  ?  HENT:  ?   Head: Normocephalic and atraumatic.  ?Eyes:  ?   Conjunctiva/sclera: Conjunctivae normal.  ?Cardiovascular:  ?   Rate and Rhythm: Normal rate and regular rhythm.  ?Pulmonary:  ?   Effort: Pulmonary effort is normal. No respiratory distress.  ?   Breath sounds: No stridor.  ?Abdominal:  ?   General: There is no distension.  ?Skin: ?   General: Skin is warm and dry.  ?Neurological:  ?   Mental Status: He is alert.  ?   Comments: Delayed, face is symmetric  ? ? ?ED Results / Procedures / Treatments   ? ? ?Procedures ?Procedures  ? ? ?Medications Ordered in ED ?Medications - No data to display ? ?This patient with a Hx of cirrhosis presents to the ED for concern of altered mental status, this involves an  extensive number of treatment options, and is a complaint that carries with it a high risk of complications and morbidity.   ? ?The differential diagnosis includes progression of disease, infection, bacteremia, sepsis ? ? ?Social Determinants of Health: ? ?Cirrhosis ? ?Additional history obtained: ? ?Additional history and/or information obtained from chart review, notable for discharge summary as below: ? ?Admit date: 06/17/2021 ?Discharge date: 06/23/2021 ?  ?Discharge Diagnoses:  ?Principal Problem: ?  Cirrhosis of liver ([place]) ?Active Problems: ?  Decompensated hepatic cirrhosis ([place]) ?  Anemia of chronic disease ?  Leukocytosis ?  Steroid-induced hyperglycemia ?Thrombocytopenia ?Alcoholic hepatitis ?Hyperglycemia secondary to steroids ?Esophageal varices ?Acute kidney injury ?Hepatorenal syndrome ?Hypoproteinemia ?Leukocytosis ?Alcohol abuse ?Anemia ?Lower extremity edema ?Magnesium deficiency ? ? ?After the initial evaluation, orders, including: Labs monitoring were initiated. ? ? ?Patient placed on Cardiac and Pulse-Oximetry Monitors. ?The patient was maintained on a cardiac monitor.  The cardiac monitored showed an rhythm of 90 sinus normal ?The patient was also maintained on pulse oximetry. The readings were typically 100% room air ? ? ?On repeat evaluation of the patient stayed the same ? ?Lab Tests: ? ?I personally interpreted labs.  The pertinent results include: Hyperbilirubinemia hyperammonemia ? ? ?MDM ? ?3:26 PM ?Patient awake, alert, similar.  Wife and I discussed all findings.  We specifically discussed patient's prognosis, given his known liver failure, the context of prior alcohol abuse, and increasing doses of lactulose not substantially changing his mentation.  We discussed admission versus discharge, the former being preferred by the patient and wife with increased lactulose dosing.  I discussed the case with our social work colleagues to attempt to facilitate additional care, both supplies here  and home health resources.  Patient's evaluation consistent with persistent liver dysfunction with resultant encephalopathy, without evidence for concurrent new infection, renal dysfunction.  As above, after discussion on all findings, patient was started on increased lactulose dosing, will follow-up with GI. ?Final Clinical Impression(s) / ED Diagnoses ?Final diagnoses:  ?Encephalopathy  ?Hyperammonemia ([place])  ? ? ?Rx / DC Orders ?ED Discharge Orders   ? ?      Ordered  ?  lactulose (CHRONULAC) 10 GM/15ML solution  3 times daily       ? 07/15/21 1529  ? ?  ?  ? ?  ? ? ?  ?Carmin Muskrat, MD ?07/15/21 1529 ? ?

## 2021-07-16 ENCOUNTER — Encounter: Payer: Self-pay | Admitting: Nurse Practitioner

## 2021-07-16 ENCOUNTER — Other Ambulatory Visit: Payer: Medicaid Other | Admitting: Nurse Practitioner

## 2021-07-16 DIAGNOSIS — Z515 Encounter for palliative care: Secondary | ICD-10-CM

## 2021-07-16 DIAGNOSIS — K7469 Other cirrhosis of liver: Secondary | ICD-10-CM

## 2021-07-16 NOTE — Progress Notes (Signed)
? ? ?Manufacturing engineer ?Community Palliative Care Consult Note ?Telephone: [phone]  ?Fax: [phone]  ? ?Date of encounter: 07/16/21 ?6:25 PM ?PATIENT NAME: Eric Dennis [place] [address]   ?[id number] (home)  ?DOB: May 28, 1965 ?MRN: [id number] ?PRIMARY CARE PROVIDER:    ?Sabra Heck, [place], [place],  ?[address] ?[place] [address] ?[phone] ? ?REFERRING PROVIDER:   ?Scheryl Marten, [place] ?[address] ?[address] ?[place],  [place] [address] ?[phone] ? ?RESPONSIBLE PARTY:    ?Contact Information   ? ? Name Relation Home Work Mobile  ? Koker,Saundria Spouse [phone]  [phone]  ? Moose,Barbara Mother [phone]    ? ?  ? ?Due to the COVID-19 crisis, this visit was done via telemedicine from my office and it was initiated and consent by this patient and or family. ? ?I connected with Ms. Eric Dennis with Sammuel Cooper OR [facility]OXY on 07/16/21 by telephone as video not available enabled telemedicine application and verified that I am speaking with the correct person using two identifiers. ?  ?I discussed the limitations of evaluation and management by telemedicine. The patient expressed understanding and agreed to proceed. Palliative Care was asked to follow this patient by consultation request of  Sabra Heck, [place], PA to address advance care planning and complex medical decision making. This is the initial visit.                        ?ASSESSMENT AND PLAN / RECOMMENDATIONS:  ?Symptom Management/Plan: ?1. Advance Care Planning;  Ongoing discussions with wife ? ?2. Debility secondary to cirrhosis, discussed with wife chronic disease progression, recent visit to ed and importance of lactulose. We talked functional abilities as he is bed-bound, total adl's including bathing, dressing, feeding. Will need further discussion of care needs. Ms. Miskell and I talked about PC SW for consult for resources, caregivers.  ? ?3. Goals of Care: Goals  include to maximize quality of life and symptom management. Our advance care planning conversation included a discussion about:    ?The value and importance of advance care planning  ?Exploration of personal, cultural or spiritual beliefs that might influence medical decisions  ?Exploration of goals of care in the event of a sudden injury or illness  ?Identification and preparation of a healthcare agent  ?Review and updating or creation of an advance directive document. ? ?4. Palliative care encounter; Palliative care encounter; Palliative medicine team will continue to support patient, patient's family, and medical team. Visit consisted of counseling and education dealing with the complex and emotionally intense issues of symptom management and palliative care in the setting of serious and potentially life-threatening illness ?Follow up Palliative Care Visit: Palliative care will continue to follow for complex medical decision making, advance care planning, and clarification of goals. Return 2 weeks or prn. ? ?I spent 35 minutes providing this consultation. More than 50% of the time in this consultation was spent in counseling and care coordination. ?PPS: 30% ? ?Chief Complaint: Initial palliative consult for complex medical decision making ? ?HISTORY OF PRESENT ILLNESS:  Eric Dennis is a 56 y.o. year old male  with multiple medical problems including liver cirrhosis, hepatorenal syndrome, anemia of chronic disease, alcohol abuse. I called Ms. Eric Dennis for initial pc visit for telemedicine as video not available visit per telephone. Ms. Eric Dennis and I talked about purpose of pc visit, reviewed past medical history, ros, symptoms, recent ED visit for decompensated hepatic cirrhosis.  We talked about medications including lactulose. Family and social history, support system. We talked about overall decline, progressive disease of cirrhosis. We talked about medical goals. We talked about concerns. Discussed will  schedule in person pc visit for further evaluation for further discussion of goc. Ms. Eric Dennis was in agreement. Therapeutic listening, emotional support provided. Questions answered, contact information provided.  ? ?History obtained from review of EMR, discussion with Mrs Eric Dennis with Mr. Eric Dennis.  ?I reviewed available labs, medications, imaging, studies and related documents from the EMR.  Records reviewed and summarized above.  ? ?ROS ?10 point system reviewed all negative except HPI ? ?Physical Exam: ?deferred ?CURRENT PROBLEM LIST:  ?Patient Active Problem List  ? Diagnosis Date Noted  ? Anemia of chronic disease   ? Leukocytosis   ? Steroid-induced hyperglycemia   ? Cirrhosis of liver ([place]) 06/17/2021  ? Malnutrition of moderate degree 06/13/2021  ? Decompensated hepatic cirrhosis ([place]) 06/08/2021  ? Hypokalemia 08/22/2020  ? Hyponatremia 08/22/2020  ? AKI (acute kidney injury) ([place]) 08/22/2020  ? Alcoholic cirrhosis of liver with ascites ([place]) 08/22/2020  ? Acute blood loss anemia 08/22/2020  ? Acute hypoxemic respiratory failure ([place]) 08/22/2020  ? Aspiration pneumonia ([place]) 08/22/2020  ? Lymphadenopathy 02/16/2019  ? Orthostatic hypotension 10/08/2018  ? Thrombocytopenia ([place]) 10/08/2018  ? Transaminasemia   ? Syncope 10/07/2018  ? Idiopathic hypotension   ? Elevated liver enzymes 01/25/2014  ? Loss of weight 01/25/2014  ? TOBACCO ABUSE 08/13/2006  ? ALLERGIC RHINITIS 08/13/2006  ? ?PAST MEDICAL HISTORY:  ?Active Ambulatory Problems  ?  Diagnosis Date Noted  ? TOBACCO ABUSE 08/13/2006  ? ALLERGIC RHINITIS 08/13/2006  ? Elevated liver enzymes 01/25/2014  ? Loss of weight 01/25/2014  ? Syncope 10/07/2018  ? Idiopathic hypotension   ? Orthostatic hypotension 10/08/2018  ? Thrombocytopenia ([place]) 10/08/2018  ? Transaminasemia   ? Lymphadenopathy 02/16/2019  ? Hypokalemia 08/22/2020  ? Hyponatremia 08/22/2020  ? AKI (acute kidney injury) ([place]) 08/22/2020  ? Alcoholic cirrhosis of liver with ascites ([place]) 08/22/2020   ? Acute blood loss anemia 08/22/2020  ? Acute hypoxemic respiratory failure ([place]) 08/22/2020  ? Aspiration pneumonia ([place]) 08/22/2020  ? Decompensated hepatic cirrhosis ([place]) 06/08/2021  ? Malnutrition of moderate degree 06/13/2021  ? Cirrhosis of liver ([place]) 06/17/2021  ? Anemia of chronic disease   ? Leukocytosis   ? Steroid-induced hyperglycemia   ? ?Resolved Ambulatory Problems  ?  Diagnosis Date Noted  ? Upper GI bleed 08/22/2020  ? GI bleed 08/22/2020  ? ?Past Medical History:  ?Diagnosis Date  ? Elevated liver function tests   ? High cholesterol   ? Weight loss   ? ?SOCIAL HX:  ?Social History  ? ?Tobacco Use  ? Smoking status: Every Day  ?  Packs/day: 0.25  ?  Years: 20.00  ?  Pack years: 5.00  ?  Types: Cigarettes  ? Smokeless tobacco: Never  ? Tobacco comments:  ?  1 pack every 4 days x 20 yrs  ?Substance Use Topics  ? Alcohol use: Yes  ?  Alcohol/week: 14.0 standard drinks  ?  Types: 14 Cans of beer per week  ? ?FAMILY HX:  ?Family History  ?Problem Relation Age of Onset  ? Diabetes Mother   ? Heart disease Maternal Grandmother   ?   ? ?ALLERGIES: No Known Allergies   ?PERTINENT MEDICATIONS:  ?Outpatient Encounter Medications as of 07/16/2021  ?Medication Sig  ? blood glucose meter kit and supplies Dispense based on patient and insurance preference.  Use up to four times daily as directed. (FOR ICD-10 E10.9, E11.9).  ? folic acid (FOLVITE) 1 MG tablet Take 1 tablet (1 mg total) by mouth daily.  ? insulin aspart (NOVOLOG) 100 UNIT/ML FlexPen Inject 3 Units into the skin 3 (three) times daily with meals. Do not give if fingerstick blood sugar is less than 80 or if you do not eat at least 50% of your meal  ? insulin glargine (LANTUS) 100 UNIT/ML Solostar Pen Inject 15 Units into the skin daily.  ? Insulin Pen Needle (PEN NEEDLES 3/16") 31G X 5 MM MISC 1 each by Does not apply route 4 (four) times daily. Use to inject insulin  ? lactulose (CHRONULAC) 10 GM/15ML solution Take 60 mLs (40 g total) by mouth 3  (three) times daily.  ? Multiple Vitamin (MULTIVITAMIN WITH MINERALS) TABS tablet Take 1 tablet by mouth daily.  ? Nystatin (GERHARDT'S BUTT CREAM) CREA Apply 1 application. topically 4 (four) times daily.  ? panto

## 2021-07-23 ENCOUNTER — Emergency Department (HOSPITAL_COMMUNITY): Payer: Medicaid Other

## 2021-07-23 ENCOUNTER — Other Ambulatory Visit: Payer: Self-pay

## 2021-07-23 ENCOUNTER — Inpatient Hospital Stay (HOSPITAL_COMMUNITY)
Admission: EM | Admit: 2021-07-23 | Discharge: 2021-07-25 | DRG: 432 | Disposition: A | Discharge status: Hospice | Payer: Medicaid Other | Attending: Family Medicine | Admitting: Family Medicine

## 2021-07-23 ENCOUNTER — Encounter (HOSPITAL_COMMUNITY): Payer: Self-pay | Admitting: Emergency Medicine

## 2021-07-23 DIAGNOSIS — Z79899 Other long term (current) drug therapy: Secondary | ICD-10-CM | POA: Diagnosis not present

## 2021-07-23 DIAGNOSIS — Z20822 Contact with and (suspected) exposure to covid-19: Secondary | ICD-10-CM | POA: Diagnosis present

## 2021-07-23 DIAGNOSIS — G9341 Metabolic encephalopathy: Secondary | ICD-10-CM | POA: Diagnosis present

## 2021-07-23 DIAGNOSIS — Z8719 Personal history of other diseases of the digestive system: Secondary | ICD-10-CM

## 2021-07-23 DIAGNOSIS — F17210 Nicotine dependence, cigarettes, uncomplicated: Secondary | ICD-10-CM | POA: Diagnosis present

## 2021-07-23 DIAGNOSIS — K7682 Hepatic encephalopathy: Secondary | ICD-10-CM | POA: Diagnosis present

## 2021-07-23 DIAGNOSIS — Z515 Encounter for palliative care: Secondary | ICD-10-CM | POA: Diagnosis not present

## 2021-07-23 DIAGNOSIS — D696 Thrombocytopenia, unspecified: Secondary | ICD-10-CM | POA: Diagnosis present

## 2021-07-23 DIAGNOSIS — D638 Anemia in other chronic diseases classified elsewhere: Secondary | ICD-10-CM | POA: Diagnosis present

## 2021-07-23 DIAGNOSIS — E119 Type 2 diabetes mellitus without complications: Secondary | ICD-10-CM | POA: Diagnosis present

## 2021-07-23 DIAGNOSIS — K7031 Alcoholic cirrhosis of liver with ascites: Secondary | ICD-10-CM | POA: Diagnosis present

## 2021-07-23 DIAGNOSIS — Z66 Do not resuscitate: Secondary | ICD-10-CM | POA: Diagnosis present

## 2021-07-23 DIAGNOSIS — Z833 Family history of diabetes mellitus: Secondary | ICD-10-CM | POA: Diagnosis not present

## 2021-07-23 DIAGNOSIS — K7460 Unspecified cirrhosis of liver: Secondary | ICD-10-CM | POA: Diagnosis present

## 2021-07-23 DIAGNOSIS — J690 Pneumonitis due to inhalation of food and vomit: Secondary | ICD-10-CM | POA: Diagnosis present

## 2021-07-23 DIAGNOSIS — Z794 Long term (current) use of insulin: Secondary | ICD-10-CM | POA: Diagnosis not present

## 2021-07-23 DIAGNOSIS — R4182 Altered mental status, unspecified: Principal | ICD-10-CM

## 2021-07-23 LAB — COMPREHENSIVE METABOLIC PANEL
ALT: 74 U/L — ABNORMAL HIGH (ref 0–44)
AST: 63 U/L — ABNORMAL HIGH (ref 15–41)
Albumin: 2.8 g/dL — ABNORMAL LOW (ref 3.5–5.0)
Alkaline Phosphatase: 105 U/L (ref 38–126)
Anion gap: 9 (ref 5–15)
BUN: 35 mg/dL — ABNORMAL HIGH (ref 6–20)
CO2: 30 mmol/L (ref 22–32)
Calcium: 9.8 mg/dL (ref 8.9–10.3)
Chloride: 97 mmol/L — ABNORMAL LOW (ref 98–111)
Creatinine, Ser: 0.7 mg/dL (ref 0.61–1.24)
GFR, Estimated: >60 mL/min (ref >60)
Glucose, Bld: 132 mg/dL — ABNORMAL HIGH (ref 70–99)
Potassium: 4.5 mmol/L (ref 3.5–5.1)
Sodium: 136 mmol/L (ref 135–145)
Total Bilirubin: 21.1 mg/dL (ref 0.3–1.2)
Total Protein: 6.6 g/dL (ref 6.5–8.1)

## 2021-07-23 LAB — CBC WITH DIFFERENTIAL/PLATELET
Abs Immature Granulocytes: 0.06 10*3/uL (ref 0.00–0.07)
Basophils Absolute: 0 10*3/uL (ref 0.0–0.1)
Basophils Relative: 0 %
Eosinophils Absolute: 0 10*3/uL (ref 0.0–0.5)
Eosinophils Relative: 0 %
HCT: 31.1 % — ABNORMAL LOW (ref 39.0–52.0)
Hemoglobin: 11 g/dL — ABNORMAL LOW (ref 13.0–17.0)
Immature Granulocytes: 1 %
Lymphocytes Relative: 6 %
Lymphs Abs: 0.6 10*3/uL — ABNORMAL LOW (ref 0.7–4.0)
MCH: 43.1 pg — ABNORMAL HIGH (ref 26.0–34.0)
MCHC: 35.4 g/dL (ref 30.0–36.0)
MCV: 122 fL — ABNORMAL HIGH (ref 80.0–100.0)
Monocytes Absolute: 1.9 10*3/uL — ABNORMAL HIGH (ref 0.1–1.0)
Monocytes Relative: 16 %
Neutro Abs: 9 10*3/uL — ABNORMAL HIGH (ref 1.7–7.7)
Neutrophils Relative %: 77 %
Platelets: 91 10*3/uL — ABNORMAL LOW (ref 150–400)
RBC: 2.55 MIL/uL — ABNORMAL LOW (ref 4.22–5.81)
RDW: 13 % (ref 11.5–15.5)
WBC: 11.7 10*3/uL — ABNORMAL HIGH (ref 4.0–10.5)
nRBC: 0 % (ref 0.0–0.2)

## 2021-07-23 LAB — URINALYSIS, ROUTINE W REFLEX MICROSCOPIC
Glucose, UA: NEGATIVE mg/dL
Ketones, ur: NEGATIVE mg/dL
Leukocytes,Ua: NEGATIVE
Nitrite: NEGATIVE
Protein, ur: NEGATIVE mg/dL
Specific Gravity, Urine: 1.02 (ref 1.005–1.030)
pH: 5 (ref 5.0–8.0)

## 2021-07-23 LAB — PROTIME-INR
INR: 2.3 — ABNORMAL HIGH (ref 0.8–1.2)
Prothrombin Time: 24.8 seconds — ABNORMAL HIGH (ref 11.4–15.2)

## 2021-07-23 LAB — GLUCOSE, CAPILLARY
Glucose-Capillary: 127 mg/dL — ABNORMAL HIGH (ref 70–99)
Glucose-Capillary: 116 mg/dL — ABNORMAL HIGH (ref 70–99)

## 2021-07-23 LAB — RESP PANEL BY RT-PCR (FLU A&B, COVID) ARPGX2
Influenza A by PCR: NEGATIVE
Influenza B by PCR: NEGATIVE
SARS Coronavirus 2 by RT PCR: NEGATIVE

## 2021-07-23 LAB — AMMONIA: Ammonia: 43 umol/L — ABNORMAL HIGH (ref 9–35)

## 2021-07-23 LAB — APTT: aPTT: 45 seconds — ABNORMAL HIGH (ref 24–36)

## 2021-07-23 LAB — LACTIC ACID, PLASMA: Lactic Acid, Venous: 1.9 mmol/L (ref 0.5–1.9)

## 2021-07-23 MED ORDER — POLYETHYLENE GLYCOL 3350 17 G PO PACK: 17.0000 g | PACK | Freq: Every day | ORAL | Status: DC | PRN

## 2021-07-23 MED ORDER — CHLORHEXIDINE GLUCONATE 0.12 % MT SOLN
15.0000 mL | Freq: Two times a day (BID) | OROMUCOSAL | Status: DC
  Administered 2021-07-23 – 2021-07-25 (×2): 15 mL via OROMUCOSAL

## 2021-07-23 MED ORDER — BISACODYL 10 MG RE SUPP: 10.0000 mg | Freq: Every day | RECTAL | Status: DC | PRN

## 2021-07-23 MED ORDER — RIFAXIMIN 550 MG PO TABS
550.0000 mg | ORAL_TABLET | Freq: Two times a day (BID) | ORAL | Status: DC
  Filled 2021-07-23 (×3): qty 1

## 2021-07-23 MED ORDER — SODIUM CHLORIDE 0.9% FLUSH
3.0000 mL | Freq: Two times a day (BID) | INTRAVENOUS | Status: DC
  Administered 2021-07-23 (×2): 3 mL via INTRAVENOUS

## 2021-07-23 MED ORDER — SODIUM CHLORIDE 0.9 % IV SOLN
2.0000 g | INTRAVENOUS | Status: DC
  Administered 2021-07-23 – 2021-07-24 (×2): 2 g via INTRAVENOUS
  Filled 2021-07-23 (×2): qty 20

## 2021-07-23 MED ORDER — LACTULOSE 10 GM/15ML PO SOLN: 40.0000 g | Freq: Three times a day (TID) | ORAL | Status: DC

## 2021-07-23 MED ORDER — THIAMINE HCL 100 MG PO TABS
100.0000 mg | ORAL_TABLET | Freq: Every day | ORAL | Status: DC
  Filled 2021-07-23: qty 1

## 2021-07-23 MED ORDER — PANTOPRAZOLE SODIUM 40 MG PO TBEC
40.0000 mg | DELAYED_RELEASE_TABLET | Freq: Every day | ORAL | Status: DC
  Filled 2021-07-23 (×2): qty 1

## 2021-07-23 MED ORDER — ONDANSETRON HCL 4 MG/2ML IJ SOLN: 4.0000 mg | Freq: Four times a day (QID) | INTRAMUSCULAR | Status: DC | PRN

## 2021-07-23 MED ORDER — SODIUM CHLORIDE 0.9 % IV SOLN: 250.0000 mL | INTRAVENOUS | Status: DC | PRN

## 2021-07-23 MED ORDER — ACETAMINOPHEN 325 MG PO TABS: 650.0000 mg | ORAL_TABLET | Freq: Four times a day (QID) | ORAL | Status: DC | PRN

## 2021-07-23 MED ORDER — SODIUM CHLORIDE 0.9 % IV SOLN
2.0000 g | Freq: Once | INTRAVENOUS | Status: AC
Start: 2021-07-23 — End: 2021-07-23
  Administered 2021-07-23: 2 g via INTRAVENOUS
  Filled 2021-07-23: qty 20

## 2021-07-23 MED ORDER — LACTATED RINGERS IV BOLUS (SEPSIS)
1000.0000 mL | Freq: Once | INTRAVENOUS | Status: AC
  Administered 2021-07-23: 1000 mL via INTRAVENOUS

## 2021-07-23 MED ORDER — INSULIN ASPART 100 UNIT/ML IJ SOLN: 0.0000 [IU] | Freq: Every day | INTRAMUSCULAR | Status: DC

## 2021-07-23 MED ORDER — FOLIC ACID 1 MG PO TABS
1.0000 mg | ORAL_TABLET | Freq: Every day | ORAL | Status: DC
  Filled 2021-07-23: qty 1

## 2021-07-23 MED ORDER — SODIUM CHLORIDE 0.9 % IV SOLN
500.0000 mg | INTRAVENOUS | Status: DC
  Administered 2021-07-23 – 2021-07-24 (×2): 500 mg via INTRAVENOUS
  Filled 2021-07-23 (×2): qty 5

## 2021-07-23 MED ORDER — ACETAMINOPHEN 650 MG RE SUPP
650.0000 mg | Freq: Four times a day (QID) | RECTAL | Status: DC | PRN
Start: 2021-07-23 — End: 2021-07-25

## 2021-07-23 MED ORDER — LACTULOSE ENEMA
300.0000 mL | Freq: Four times a day (QID) | ORAL | Status: DC
  Administered 2021-07-23 – 2021-07-25 (×5): 300 mL via RECTAL
  Filled 2021-07-23 (×21): qty 300

## 2021-07-23 MED ORDER — SODIUM CHLORIDE 0.9% FLUSH: 3.0000 mL | INTRAVENOUS | Status: DC | PRN

## 2021-07-23 MED ORDER — ADULT MULTIVITAMIN W/MINERALS CH
1.0000 | ORAL_TABLET | Freq: Every day | ORAL | Status: DC
  Filled 2021-07-23: qty 1

## 2021-07-23 MED ORDER — SODIUM CHLORIDE 0.9 % IV SOLN: INTRAVENOUS | Status: DC

## 2021-07-23 MED ORDER — HEPARIN SODIUM (PORCINE) 5000 UNIT/ML IJ SOLN
5000.0000 [IU] | Freq: Three times a day (TID) | INTRAMUSCULAR | Status: DC
  Administered 2021-07-23 – 2021-07-24 (×5): 5000 [IU] via SUBCUTANEOUS
  Filled 2021-07-23 (×5): qty 1

## 2021-07-23 MED ORDER — LACTULOSE ENEMA
300.0000 mL | Freq: Once | ORAL | Status: AC
  Administered 2021-07-23: 300 mL via RECTAL
  Filled 2021-07-23: qty 300

## 2021-07-23 MED ORDER — ADULT MULTIVITAMIN W/MINERALS CH: 1.0000 | ORAL_TABLET | Freq: Every day | ORAL | Status: DC

## 2021-07-23 MED ORDER — ORAL CARE MOUTH RINSE
15.0000 mL | Freq: Two times a day (BID) | OROMUCOSAL | Status: DC
  Administered 2021-07-23 – 2021-07-25 (×3): 15 mL via OROMUCOSAL

## 2021-07-23 MED ORDER — ONDANSETRON HCL 4 MG PO TABS: 4.0000 mg | ORAL_TABLET | Freq: Four times a day (QID) | ORAL | Status: DC | PRN

## 2021-07-23 MED ORDER — INSULIN ASPART 100 UNIT/ML IJ SOLN: 0.0000 [IU] | Freq: Three times a day (TID) | INTRAMUSCULAR | Status: DC

## 2021-07-23 NOTE — ED Triage Notes (Signed)
Per wife pt has been declining over the last week. Per ems pt only responds to painful stimuli and hypotensive on arrival.  ?

## 2021-07-23 NOTE — ED Notes (Signed)
Materials notified for need for flexiseal ?

## 2021-07-23 NOTE — ED Notes (Signed)
Patient transported to CT 

## 2021-07-23 NOTE — ED Notes (Signed)
Upon removing clamp for Lactulose the Rectal tube was found to be out of the pts rectum, receiving RN made aware, Emokpae, MD notified re: tube removal and rectal temp which may not be accurate at this time recent admin of med via rectum ?

## 2021-07-23 NOTE — Consult Note (Signed)
? ?Gastroenterology Consult  ? ?Referring Provider: No ref. provider found ?Primary Care Physician:  Scheryl Marten, PA ?Primary Gastroenterologist:  Dr. Therisa Doyne [facility]) ? ?Patient ID: Eric Dennis; [id number]; 12/07/1965  ? ?Admit date: 07/23/2021 ? LOS: 0 days  ? ?Date of Consultation: 07/23/2021 ? ?Reason for Consultation:  Hepatic encephalopathy ? ?History of Present Illness  ? ?Eric Dennis is a 56 y.o. year old male history of EtOH abuse, alcoholic cirrhosis with prior esophageal varices s/p banding, severe portal hypertensive gastropathy, nonbleeding gastric and duodenal ulcers on EGD performed 05/13/2021 and multiple admissions and ED visits for hepatic encephalopathy.  He presented to the ED today 5/17 via EMS with altered mental status and decline over the past week with not eating or drinking.  ? ?ED course: ?Patient brought by EMS who reported somnolence.  There was no tenderness to palpation to his abdomen on exam. ?Temperature 99.5 and has been tachycardic with heart rate ranging from 100-119.  He was started empirically on Rocephin and given IV fluids.  Blood pressures have been stable. ?Chest x-ray with patchy left infiltrate or subsegmental atelectasis, clear right lung.  Oxygen saturations have ranged from 91 to 99% on room air. ?Labs -hemoglobin 11, WBC 11.7, MCV 122, platelets 91, albumin 2.8, AST 63, ALT 74, alk phos 105, T. bili 21.1, PT 24.8, INR 2.3, ammonia 43, lactic acid 1.9 ?UA -few bacteria, small amount of bilirubin, urine culture pending ?Blood cultures pending ?CT head negative for acute intracranial process or bleeding, atrophy and nonspecific white matter changes present. ? ?Current weight 86 kg, last recorded weight in the system was from 4/13 weighing 91.8 kg (about a 13 pound weight loss in a month). ? ? ?Consult: ?Appears patient used to follow with Dr. Laural Golden in the past for elevated liver enzymes in 2015, has not had any follow-up with him since then.  Lately patient has  been seeing Dr. Therisa Doyne with [facility] for colonoscopy and EGD. ? ?Last EGD 05/13/2021 with grade 1 esophageal varices, portal hypertensive gastropathy, nonbleeding gastric ulcers Forrest class III, duodenal ulcers with clean base Forrest class III. ? ?Last colonoscopy August 2022 -single 6 mm polyp in the ascending colon, diverticulosis in the sigmoid colon hepatic flexure, and descending colon, rectal varices, and nonbleeding internal hemorrhoids.  Endoscopy performed at the same time with normal upper third and middle third of esophagus, grade 2 esophageal varices s/p banding, portal hypertensive gastropathy, normal duodenum. ? ?He was hospitalized from 4/2 to 4/11 due to decompensated alcoholic cirrhosis.  He required admission to the ICU with intubation and ventilation.  He was treated for alcoholic hepatitis and hepatorenal syndrome.  He received 1 unit PRBCs, then he was extubated and started on steroids and tube feeds.  He was subsequently admitted to rehab on 4/11 for inpatient PT/ST/OT.  He developed leukocytosis on 4/12 and had completed work-up with negative urine and blood cultures.  He was refusing lactulose.  He was given prednisone taper.  Throughout his rehab course he had improved function of his bilateral upper extremities and bilateral lower extremities. ? ?He has had follow-ups with [facility] posthospitalization, records are not available via Care Everywhere. ? ?He also had outpatient evaluation by [facility] palliative care services on 5/10 via telemedicine with his wife as his proxy.  Advanced care planning and goals of care were discussed.  Disease progression was discussed with the patient's wife as well as the importance of taking lactulose.  She was offered resources and caregivers.  It appears he had been bedbound and needing total assistance with ADLs. ? ?Patient history: ?Patient was somnolent during examination.  History provided by his wife who has been his caregiver at home.  She reports  up until yesterday he has been somewhat conversive with his family and awake and aware of his family members that have been there to visit him.  She reports that he was attempting to eat however has not had much p.o. intake as he has been pocketing food.  She says she was advised to not force feed him due to risk of aspiration.  She does state that he does like to drink liquids especially ginger ale and has been doing fairly decent with that.  He at times also been pocketing his medications and spitting them out.  She reports that he has been getting 30 mL of lactulose 4 times a day however sometimes he will hold the medication and some of it was pill out that she feels like he is missing at least 1 dose a day.  He has been having at least 2 bowel movements a days, sometimes 3.  She states that since he came home from rehab he has not been out of bed and she has been providing total care for him.  She stated that he could have stayed in rehab a little longer however the patient requested to go home.  No signs of GI bleeding.  She does report that he has also weight since his discharge from rehab due to his poor oral intake.  She denies any increase swelling or increased abdominal girth at home. ? ? ?Past Medical History:  ?Diagnosis Date  ? Elevated liver function tests   ? High cholesterol   ? Weight loss   ? ? ?Past Surgical History:  ?Procedure Laterality Date  ? COLONOSCOPY N/A 03/14/2014  ? Procedure: COLONOSCOPY;  Surgeon: Rogene Houston, MD;  [facility];  Service: Endoscopy;  Laterality: N/A;  730 - moved to 1/6 @ 12:00 - Ann notified pt  ? COLONOSCOPY WITH PROPOFOL N/A 11/04/2020  ? Procedure: COLONOSCOPY WITH PROPOFOL;  Surgeon: Ronnette Juniper, MD;  Location: [facility];  Service: Gastroenterology;  Laterality: N/A;  ? ESOPHAGEAL BANDING  11/04/2020  ? Procedure: ESOPHAGEAL BANDING;  Surgeon: Ronnette Juniper, MD;  Location: [facility];  Service: Gastroenterology;;  ? ESOPHAGOGASTRODUODENOSCOPY N/A  05/13/2021  ? Procedure: ESOPHAGOGASTRODUODENOSCOPY (EGD);  Surgeon: Ronnette Juniper, MD;  Location: [facility];  Service: Gastroenterology;  Laterality: N/A;  ? ESOPHAGOGASTRODUODENOSCOPY (EGD) WITH PROPOFOL N/A 11/04/2020  ? Procedure: ESOPHAGOGASTRODUODENOSCOPY (EGD) WITH PROPOFOL;  Surgeon: Ronnette Juniper, MD;  Location: [facility];  Service: Gastroenterology;  Laterality: N/A;  ? IR PARACENTESIS  07/19/2020  ? NO PAST SURGERIES    ? POLYPECTOMY  11/04/2020  ? Procedure: POLYPECTOMY;  Surgeon: Ronnette Juniper, MD;  Location: [facility];  Service: Gastroenterology;;  ? ? ?Prior to Admission medications   ?Medication Sig Start Date End Date Taking? Authorizing Provider  ?folic acid (FOLVITE) 1 MG tablet Take 1 tablet (1 mg total) by mouth daily. 06/23/21  Yes Setzer, Edman Circle, PA-C  ?insulin aspart (NOVOLOG) 100 UNIT/ML FlexPen Inject 3 Units into the skin 3 (three) times daily with meals. Do not give if fingerstick blood sugar is less than 80 or if you do not eat at least 50% of your meal 06/23/21  Yes Setzer, Edman Circle, PA-C  ?insulin glargine (LANTUS) 100 UNIT/ML Solostar Pen Inject 15 Units into the skin daily. ?Patient taking differently: Inject  7 Units into the skin daily. 06/23/21  Yes Setzer, Edman Circle, PA-C  ?lactulose (CHRONULAC) 10 GM/15ML solution Take 60 mLs (40 g total) by mouth 3 (three) times daily. 07/15/21  Yes Carmin Muskrat, MD  ?Multiple Vitamin (MULTIVITAMIN WITH MINERALS) TABS tablet Take 1 tablet by mouth daily. 06/23/21  Yes Setzer, Edman Circle, PA-C  ?predniSONE (DELTASONE) 10 MG tablet START Taper dose ON 5/6  ?5/6 - 5/8: Take 30 mg (3 tablets) daily ?5/9 - 5/11: Take 20 mg (2 tablets) daily ?5/12 - 5/14: Take 10 mg (1 tablet) daily  ?5/15 - 5/17: Take 5 mg (half tablet) daily  ?5/18 stop prednisone 07/12/21 07/23/21 Yes Raulkar, Clide Deutscher, MD  ?rifaximin (XIFAXAN) 550 MG TABS tablet Take 1 tablet (550 mg total) by mouth 2 (two) times daily. 06/23/21  Yes Setzer, Edman Circle, PA-C  ?spironolactone (ALDACTONE) 50  MG tablet Take 50 mg by mouth daily. 07/09/21  Yes [provider]  ?thiamine 100 MG tablet Take 1 tablet (100 mg total) by mouth daily. 06/23/21  Yes Setzer, Edman Circle, PA-C  ?traZODone (DESYREL)

## 2021-07-23 NOTE — ED Notes (Signed)
Admitting Provider at bedside. 

## 2021-07-23 NOTE — H&P (Signed)
?                                                                                           ? ? ? ? Patient Demographics:  ? ? ?Eric Dennis, is a 56 y.o. male  MRN: [id number]   DOB - 1965-08-27 ? ?Admit Date - 07/23/2021 ? ?Outpatient Primary MD for the patient is [place], [place], [place] ? ? Assessment & Plan:  ? ?Assessment and Plan: ?Problem  ?Acute Metabolic Encephalopathy  ?Anemia of Chronic Disease  ?Decompensated Hepatic Cirrhosis (Hcc)  ?Alcoholic Cirrhosis of Liver With Ascites (Hcc)  ?Aspiration Pneumonia (Hcc)  ? ? ? ?1) acute hepatic encephalopathy in the setting of alcoholic liver cirrhosis--- ?-Largely unresponsive ?-Rectal lactulose until awake enough to take oral lactulose and rifaximin ?-As per patient's wife patient was previously told that he is not a candidate for transplant ?-Overall prognosis is poor ? ?2) social/ethics--- plan of care and advanced directive discussed with patient's wife  and patient's family friend Ms Serita Sheller  ?-Patient's wife requested DNR status ?-Overall prognosis is poor, ?-Palliative care consult requested ? ?3) possible aspiration pneumonia--leukocytosis and chest x-ray findings noted ?-Given altered mentation/encephalopathy high risk for aspiration ?--Treat empirically with Rocephin/azithromycin ? ?4) possible UTI--UA suggestive of UTI IV Rocephin as above #3 pending culture data ? ?5)DM2--A1c was 4.3 on 06/09/2021, reflecting excellent diabetic control PTA ?-Avoid overly aggressive control due to risk for hypoglycemia ?-Hold scheduled insulin ?Use Novolog/Humalog Sliding scale insulin with Accu-Cheks/Fingersticks as ordered  ? ?6) chronic anemia/chronic thrombocytopenia--- in the setting of underlying liver cirrhosis ?-Hgb stable at 11, platelets stable at 91, INR 2.1 ?-No evidence of ongoing bleeding, monitor closely ? ?Disposition/Need for in-Hospital Stay- patient unable to be discharged  at this time due to -hepatic encephalopathy requiring IV fluids pending improvement in mental status to allow for oral intake ? ?Status is: Inpatient ? ?Remains inpatient appropriate because:  ? ?Dispo: The patient is from: Home ?             Anticipated d/c is to: Home Vs residential hospice ?             Anticipated d/c date is: 2 days ?             Patient currently is not medically stable to d/c. ?Barriers: Not Clinically Stable-  ? ?With History of - ?Reviewed by me ? ?Past Medical History:  ?Diagnosis Date  ? Elevated liver function tests   ? High cholesterol   ? Weight loss   ?   ? ?Past Surgical History:  ?Procedure Laterality Date  ? COLONOSCOPY N/A 03/14/2014  ? Procedure: COLONOSCOPY;  Surgeon: Rogene Houston, MD;  [facility];  Service: Endoscopy;  Laterality: N/A;  730 - moved to 1/6 @ 12:00 - Ann notified pt  ? COLONOSCOPY WITH PROPOFOL N/A 11/04/2020  ? Procedure: COLONOSCOPY WITH PROPOFOL;  Surgeon: Ronnette Juniper, MD;  Location: [facility];  Service: Gastroenterology;  Laterality: N/A;  ? ESOPHAGEAL BANDING  11/04/2020  ? Procedure: ESOPHAGEAL BANDING;  Surgeon: Ronnette Juniper, MD;  Location: [facility];  Service: Gastroenterology;;  ? ESOPHAGOGASTRODUODENOSCOPY  N/A 05/13/2021  ? Procedure: ESOPHAGOGASTRODUODENOSCOPY (EGD);  Surgeon: Ronnette Juniper, MD;  Location: [facility];  Service: Gastroenterology;  Laterality: N/A;  ? ESOPHAGOGASTRODUODENOSCOPY (EGD) WITH PROPOFOL N/A 11/04/2020  ? Procedure: ESOPHAGOGASTRODUODENOSCOPY (EGD) WITH PROPOFOL;  Surgeon: Ronnette Juniper, MD;  Location: [facility];  Service: Gastroenterology;  Laterality: N/A;  ? IR PARACENTESIS  07/19/2020  ? NO PAST SURGERIES    ? POLYPECTOMY  11/04/2020  ? Procedure: POLYPECTOMY;  Surgeon: Ronnette Juniper, MD;  Location: [facility];  Service: Gastroenterology;;  ? ? ? ? ?Chief Complaint  ?Patient presents with  ? Altered Mental Status  ?  ? ? HPI:  ? ? Eric Dennis  is a 56 y.o. male  with history of decompensated alcoholic  cirrhosis with prior esophageal varices requiring banding , as well as tobacco abuse, longstanding history of alcohol abuse, chronic anemia and chronic thrombocytopenia in the setting of alcoholic liver cirrhosis, and significant, persistent hyperbilirubinemia who presents to the ED with concerns of unresponsiveness and inability to take in significant oral intake over the last couple days ?-As per patient's wife  and patient's family friend Ms Serita Sheller who at bedside patient's condition has been declining for at least 1 week ?No fever  Or chills  ? ?No Nausea, Vomiting or Diarrhea ?Patient is unresponsive, does not follow commands ?-In the ED ?-UA suggestive of UTI, total bilirubin 21.1, alk phos 105, ALT 74 and AST 63 ?-INR is 2.3, -Hgb 11.0, platelets 91 K ?-Serum ammonia is 43 ?CT Head is w/o acute findings ?Cxr--- possible left-sided pneumonia ?-Patient received lactulose enema and IV fluids in ED-and IV Rocephin ? ? ? ? Review of systems:  ?  ?In addition to the HPI above,  ? ?A full Review of  Systems was done, all other systems reviewed are negative except as noted above in HPI , . ? ? ? Social History:  ?Reviewed by me ? ?  ?Social History  ? ?Tobacco Use  ? Smoking status: Every Day  ?  Packs/day: 0.25  ?  Years: 20.00  ?  Pack years: 5.00  ?  Types: Cigarettes  ? Smokeless tobacco: Never  ? Tobacco comments:  ?  1 pack every 4 days x 20 yrs  ?Substance Use Topics  ? Alcohol use: Yes  ?  Alcohol/week: 14.0 standard drinks  ?  Types: 14 Cans of beer per week  ? ? ? Family History :  ?Reviewed by me ?  ?Family History  ?Problem Relation Age of Onset  ? Diabetes Mother   ? Heart disease Maternal Grandmother   ? ? Home Medications:  ? ?Prior to Admission medications   ?Medication Sig Start Date End Date Taking? Authorizing Provider  ?folic acid (FOLVITE) 1 MG tablet Take 1 tablet (1 mg total) by mouth daily. 06/23/21  Yes Setzer, Edman Circle, PA-C  ?insulin aspart (NOVOLOG) 100 UNIT/ML FlexPen Inject 3 Units  into the skin 3 (three) times daily with meals. Do not give if fingerstick blood sugar is less than 80 or if you do not eat at least 50% of your meal 06/23/21  Yes Setzer, Edman Circle, PA-C  ?insulin glargine (LANTUS) 100 UNIT/ML Solostar Pen Inject 15 Units into the skin daily. ?Patient taking differently: Inject 7 Units into the skin daily. 06/23/21  Yes Setzer, Edman Circle, PA-C  ?lactulose (CHRONULAC) 10 GM/15ML solution Take 60 mLs (40 g total) by mouth 3 (three) times daily. 07/15/21  Yes Carmin Muskrat, MD  ?Multiple Vitamin (MULTIVITAMIN WITH MINERALS) TABS tablet  Take 1 tablet by mouth daily. 06/23/21  Yes Setzer, Edman Circle, PA-C  ?predniSONE (DELTASONE) 10 MG tablet START Taper dose ON 5/6  ?5/6 - 5/8: Take 30 mg (3 tablets) daily ?5/9 - 5/11: Take 20 mg (2 tablets) daily ?5/12 - 5/14: Take 10 mg (1 tablet) daily  ?5/15 - 5/17: Take 5 mg (half tablet) daily  ?5/18 stop prednisone 07/12/21 07/23/21 Yes Raulkar, Clide Deutscher, MD  ?rifaximin (XIFAXAN) 550 MG TABS tablet Take 1 tablet (550 mg total) by mouth 2 (two) times daily. 06/23/21  Yes Setzer, Edman Circle, PA-C  ?spironolactone (ALDACTONE) 50 MG tablet Take 50 mg by mouth daily. 07/09/21  Yes [provider]  ?thiamine 100 MG tablet Take 1 tablet (100 mg total) by mouth daily. 06/23/21  Yes Setzer, Edman Circle, PA-C  ?traZODone (DESYREL) 50 MG tablet Take 0.5-1 tablets (25-50 mg total) by mouth at bedtime as needed for sleep. 06/23/21  Yes Setzer, Edman Circle, PA-C  ?blood glucose meter kit and supplies Dispense based on patient and insurance preference. Use up to four times daily as directed. (FOR ICD-10 E10.9, E11.9). 06/23/21   Setzer, Edman Circle, PA-C  ?Insulin Pen Needle (PEN NEEDLES 3/16") 31G X 5 MM MISC 1 each by Does not apply route 4 (four) times daily. Use to inject insulin 06/23/21   Raulkar, Clide Deutscher, MD  ?Nystatin (GERHARDT'S BUTT CREAM) CREA Apply 1 application. topically 4 (four) times daily. ?Patient not taking: Reported on 07/23/2021 06/23/21   Barbie Banner, PA-C  ?pantoprazole (PROTONIX) 40 MG tablet Take 1 tablet (40 mg total) by mouth daily. ?Patient not taking: Reported on 07/23/2021 06/23/21 08/22/21  Barbie Banner, PA-C  ? ? ? Allergies:  ? ? No Known All

## 2021-07-23 NOTE — Sepsis Progress Note (Signed)
Following for sepsis monitoring ?

## 2021-07-23 NOTE — Progress Notes (Signed)
Rectal tube setting placed. Inserted approx 612m of lactulose diluted. Clamped and will unclamp in approx 376m. Family educated. Pt tolerated well. SCDs and TED hose placed as well. Pt grimaced some with ted hose.  ?

## 2021-07-23 NOTE — ED Provider Notes (Signed)
Pt has hx of hepatic encephalopathy, here for somnolence, mental decline last week. ? ?No abdominal ttp on exam ? ?Pending labs, CTH ?Received rocephin for empiric antibiotic coverage, temp 99.5 and tachycardic here ? ?High aspiration risk - possible infiltrate on xray per my interpretation - rocephin should be appropriate coverage.  No hypoxia.   ? ?Wife at bedside provided supplemental history ?Pt too somnolent to communicate ? ?810 AM - signed out to hospitalist for admission ?  ?Wyvonnia Dusky, MD ?07/23/21 [phone] ? ?

## 2021-07-23 NOTE — Progress Notes (Signed)
Pt came up to room, had large green mucous stool. Pt cleaned by Loma Sousa NT.  ?

## 2021-07-23 NOTE — ED Notes (Signed)
Pharmacy notified for request to send down lactulose enema ?

## 2021-07-23 NOTE — ED Notes (Signed)
Langston Masker, MD notified re: bilirubin level 21.1 ?

## 2021-07-23 NOTE — ED Notes (Signed)
Eric Brick, MD notified re: tube, MD to place orders ?

## 2021-07-23 NOTE — ED Provider Notes (Signed)
?[place] DEPT ?[facility] Emergency Department ?Provider Note ?MRN:  [id number]  ?Arrival date & time: 07/23/21    ? ?Chief Complaint   ?Altered Mental Status ?  ?History of Present Illness   ?Eric Dennis is a 56 y.o. year-old male with a history of cirrhosis presenting to the ED with chief complaint of altered mental status. ? ?Decline over the past week, not eating, not drinking, altered mental status.  Decreased responsiveness with EMS. ? ?Review of Systems  ?I was unable to obtain a full/accurate HPI, PMH, or ROS due to the patient's altered mental status. ? ?Patient's Health History   ? ?Past Medical History:  ?Diagnosis Date  ? Elevated liver function tests   ? High cholesterol   ? Weight loss   ?  ?Past Surgical History:  ?Procedure Laterality Date  ? COLONOSCOPY N/A 03/14/2014  ? Procedure: COLONOSCOPY;  Surgeon: Rogene Houston, MD;  [facility];  Service: Endoscopy;  Laterality: N/A;  730 - moved to 1/6 @ 12:00 - Ann notified pt  ? COLONOSCOPY WITH PROPOFOL N/A 11/04/2020  ? Procedure: COLONOSCOPY WITH PROPOFOL;  Surgeon: Ronnette Juniper, MD;  Location: [facility];  Service: Gastroenterology;  Laterality: N/A;  ? ESOPHAGEAL BANDING  11/04/2020  ? Procedure: ESOPHAGEAL BANDING;  Surgeon: Ronnette Juniper, MD;  Location: [facility];  Service: Gastroenterology;;  ? ESOPHAGOGASTRODUODENOSCOPY N/A 05/13/2021  ? Procedure: ESOPHAGOGASTRODUODENOSCOPY (EGD);  Surgeon: Ronnette Juniper, MD;  Location: [facility];  Service: Gastroenterology;  Laterality: N/A;  ? ESOPHAGOGASTRODUODENOSCOPY (EGD) WITH PROPOFOL N/A 11/04/2020  ? Procedure: ESOPHAGOGASTRODUODENOSCOPY (EGD) WITH PROPOFOL;  Surgeon: Ronnette Juniper, MD;  Location: [facility];  Service: Gastroenterology;  Laterality: N/A;  ? IR PARACENTESIS  07/19/2020  ? NO PAST SURGERIES    ? POLYPECTOMY  11/04/2020  ? Procedure: POLYPECTOMY;  Surgeon: Ronnette Juniper, MD;  Location: [facility];  Service: Gastroenterology;;  ?  ?Family History  ?Problem Relation  Age of Onset  ? Diabetes Mother   ? Heart disease Maternal Grandmother   ?  ?Social History  ? ?Socioeconomic History  ? Marital status: Divorced  ?  Spouse name: Not on file  ? Number of children: 1  ? Years of education: Not on file  ? Highest education level: Not on file  ?Occupational History  ?  Employer: [employer]  ?Tobacco Use  ? Smoking status: Every Day  ?  Packs/day: 0.25  ?  Years: 20.00  ?  Pack years: 5.00  ?  Types: Cigarettes  ? Smokeless tobacco: Never  ? Tobacco comments:  ?  1 pack every 4 days x 20 yrs  ?Vaping Use  ? Vaping Use: Never used  ?Substance and Sexual Activity  ? Alcohol use: Yes  ?  Alcohol/week: 14.0 standard drinks  ?  Types: 14 Cans of beer per week  ? Drug use: No  ? Sexual activity: Yes  ?Other Topics Concern  ? Not on file  ?Social History Narrative  ? Not on file  ? ?Social Determinants of Health  ? ?Financial Resource Strain: Not on file  ?Food Insecurity: Not on file  ?Transportation Needs: Not on file  ?Physical Activity: Not on file  ?Stress: Not on file  ?Social Connections: Not on file  ?Intimate Partner Violence: Not on file  ?  ? ?Physical Exam  ? ?Vitals:  ? 07/23/21 0633 07/23/21 0700  ?BP:  118/73  ?Pulse:  (!) 111  ?Resp:  19  ?Temp: 99.5 ?F (37.5 ?C)   ?SpO2:  94%  ?  ?  CONSTITUTIONAL: Ill-appearing, NAD ?NEURO/PSYCH: Somnolent, opens eyes to voice, nonverbal, not following commands ?EYES:  eyes equal and reactive ?ENT/NECK:  no LAD, no JVD ?CARDIO: Tachycardia rate, well-perfused, normal S1 and S2 ?PULM:  CTAB no wheezing or rhonchi ?GI/GU:  non-distended, non-tender ?MSK/SPINE:  No gross deformities, no edema ?SKIN:  no rash, atraumatic ? ? ?*Additional and/or pertinent findings included in MDM below ? ?Diagnostic and Interventional Summary  ? ? EKG Interpretation ? ?Date/Time:  Wednesday Jul 23 2021 06:15:49 EDT ?Ventricular Rate:  120 ?PR Interval:  156 ?QRS Duration: 96 ?QT Interval:  342 ?QTC Calculation: 484 ?R Axis:   -28 ?Text Interpretation: Sinus  tachycardia Left anterior fascicular block Right ventricular hypertrophy ST elevation, consider inferior injury Borderline prolonged QT interval Confirmed by Gerlene Fee [phone]) on 07/23/2021 6:30:26 AM ?  ? ?  ? ?Labs Reviewed  ?CBC WITH DIFFERENTIAL/PLATELET - Abnormal; Notable for the following components:  ?    Result Value  ? WBC 11.7 (*)   ? RBC 2.55 (*)   ? Hemoglobin 11.0 (*)   ? HCT 31.1 (*)   ? MCV 122.0 (*)   ? MCH 43.1 (*)   ? Platelets 91 (*)   ? Neutro Abs 9.0 (*)   ? Lymphs Abs 0.6 (*)   ? Monocytes Absolute 1.9 (*)   ? All other components within normal limits  ?PROTIME-INR - Abnormal; Notable for the following components:  ? Prothrombin Time 24.8 (*)   ? INR 2.3 (*)   ? All other components within normal limits  ?APTT - Abnormal; Notable for the following components:  ? aPTT 45 (*)   ? All other components within normal limits  ?RESP PANEL BY RT-PCR (FLU A&B, COVID) [id number]  ?CULTURE, BLOOD (ROUTINE X 2)  ?CULTURE, BLOOD (ROUTINE X 2)  ?URINE CULTURE  ?LACTIC ACID, PLASMA  ?LACTIC ACID, PLASMA  ?COMPREHENSIVE METABOLIC PANEL  ?URINALYSIS, ROUTINE W REFLEX MICROSCOPIC  ?AMMONIA  ?  ?CT HEAD WO CONTRAST (5MM)  ?Final Result  ?  ?DG Chest Port 1 View  ?Final Result  ?  ?  ?Medications  ?lactated ringers bolus 1,000 mL (1,000 mLs Intravenous New Bag/Given 07/23/21 0645)  ?  And  ?lactated ringers bolus 1,000 mL (1,000 mLs Intravenous New Bag/Given 07/23/21 0645)  ?  And  ?lactated ringers bolus 1,000 mL (1,000 mLs Intravenous New Bag/Given 07/23/21 0645)  ?cefTRIAXone (ROCEPHIN) 2 g in sodium chloride 0.9 % 100 mL IVPB (2 g Intravenous New Bag/Given 07/23/21 0645)  ?  ? ?Procedures  /  Critical Care ?.Critical Care ?Performed by: Maudie Flakes, MD ?Authorized by: Maudie Flakes, MD  ? ?Critical care provider statement:  ?  Critical care time (minutes):  35 ?  Critical care was necessary to treat or prevent imminent or life-threatening deterioration of the following conditions:  Sepsis ?  Critical care  was time spent personally by me on the following activities:  Development of treatment plan with patient or surrogate, discussions with consultants, evaluation of patient's response to treatment, examination of patient, ordering and review of laboratory studies, ordering and review of radiographic studies, ordering and performing treatments and interventions, pulse oximetry, re-evaluation of patient's condition and review of old charts ? ?ED Course and Medical Decision Making  ?Initial Impression and Ddx ?Concern for sepsis versus hepatic encephalopathy.  Patient arrives with altered mental status, tachycardia, hypotension.  Providing 30 cc/kg fluid bolus, empiric antibiotics, code sepsis initiated, awaiting laboratory assessment.  Protecting airway. ? ?Past medical/surgical history that  increases complexity of ED encounter: Cirrhosis ? ?Interpretation of Diagnostics ?I personally reviewed the EKG and my interpretation is as follows: Sinus tachycardia ?   ?Labs pending ? ?Patient Reassessment and Ultimate Disposition/Management ?Signed out to oncoming provider, anticipating admission. ? ?Patient management required discussion with the following services or consulting groups:  None ? ?Complexity of Problems Addressed ?Acute illness or injury that poses threat of life of bodily function ? ?Additional Data Reviewed and Analyzed ?Further history obtained from: ?Further history from spouse/family member ? ?Additional Factors Impacting ED Encounter Risk ?Consideration of hospitalization ? ?Barth Kirks. Sedonia Small, MD ?[facility] Emergency Medicine ?[email or web address] ? ?Final Clinical Impressions(s) / ED Diagnoses  ? ?  ICD-10-CM   ?1. Altered mental status, unspecified altered mental status type  R41.82   ?  ?  ?ED Discharge Orders   ? ? None  ? ?  ?  ? ?Discharge Instructions Discussed with and Provided to Patient:  ? ?Discharge Instructions   ?None ?  ? ?  ?Maudie Flakes, MD ?07/23/21 [phone] ? ?

## 2021-07-24 ENCOUNTER — Other Ambulatory Visit: Payer: Self-pay | Admitting: Gastroenterology

## 2021-07-24 DIAGNOSIS — K7031 Alcoholic cirrhosis of liver with ascites: Secondary | ICD-10-CM

## 2021-07-24 DIAGNOSIS — Z515 Encounter for palliative care: Secondary | ICD-10-CM

## 2021-07-24 LAB — CBC
HCT: 27.6 % — ABNORMAL LOW (ref 39.0–52.0)
Hemoglobin: 9.5 g/dL — ABNORMAL LOW (ref 13.0–17.0)
MCH: 43.4 pg — ABNORMAL HIGH (ref 26.0–34.0)
MCHC: 34.4 g/dL (ref 30.0–36.0)
MCV: 126 fL — ABNORMAL HIGH (ref 80.0–100.0)
Platelets: 73 10*3/uL — ABNORMAL LOW (ref 150–400)
RBC: 2.19 MIL/uL — ABNORMAL LOW (ref 4.22–5.81)
RDW: 13.2 % (ref 11.5–15.5)
WBC: 10.3 10*3/uL (ref 4.0–10.5)
nRBC: 0 % (ref 0.0–0.2)

## 2021-07-24 LAB — COMPREHENSIVE METABOLIC PANEL
ALT: 61 U/L — ABNORMAL HIGH (ref 0–44)
AST: 76 U/L — ABNORMAL HIGH (ref 15–41)
Albumin: 2.3 g/dL — ABNORMAL LOW (ref 3.5–5.0)
Alkaline Phosphatase: 87 U/L (ref 38–126)
Anion gap: 8 (ref 5–15)
BUN: 28 mg/dL — ABNORMAL HIGH (ref 6–20)
CO2: 27 mmol/L (ref 22–32)
Calcium: 9.1 mg/dL (ref 8.9–10.3)
Chloride: 106 mmol/L (ref 98–111)
Creatinine, Ser: 0.38 mg/dL — ABNORMAL LOW (ref 0.61–1.24)
GFR, Estimated: >60 mL/min (ref >60)
Glucose, Bld: 111 mg/dL — ABNORMAL HIGH (ref 70–99)
Potassium: 4 mmol/L (ref 3.5–5.1)
Sodium: 141 mmol/L (ref 135–145)
Total Bilirubin: 16.4 mg/dL — ABNORMAL HIGH (ref 0.3–1.2)
Total Protein: 5.5 g/dL — ABNORMAL LOW (ref 6.5–8.1)

## 2021-07-24 LAB — GLUCOSE, CAPILLARY
Glucose-Capillary: 113 mg/dL — ABNORMAL HIGH (ref 70–99)
Glucose-Capillary: 127 mg/dL — ABNORMAL HIGH (ref 70–99)
Glucose-Capillary: 108 mg/dL — ABNORMAL HIGH (ref 70–99)
Glucose-Capillary: 96 mg/dL (ref 70–99)

## 2021-07-24 LAB — URINE CULTURE: Culture: NO GROWTH

## 2021-07-24 LAB — PROTIME-INR
INR: 2.4 — ABNORMAL HIGH (ref 0.8–1.2)
Prothrombin Time: 26.1 seconds — ABNORMAL HIGH (ref 11.4–15.2)

## 2021-07-24 NOTE — Progress Notes (Signed)
PROGRESS NOTE     Eric Dennis, is a 56 y.o. male, DOB - 21-Jul-1965, ZES:[id number]  Admit date - 07/23/2021   Admitting Physician Taraji Mungo Denton Brick, MD  Outpatient Primary MD for the patient is [place], [place], [place]  LOS - 1  Chief Complaint  Patient presents with   Altered Mental Status        Brief Narrative:  56 y.o. male  with history of decompensated alcoholic cirrhosis with prior esophageal varices requiring banding , as well as tobacco abuse, longstanding history of alcohol abuse, chronic anemia and chronic thrombocytopenia in the setting of alcoholic liver cirrhosis, and significant, persistent hyperbilirubinemia admitted on 07/23/2021 with another bout of hepatic encephalopathy    -Assessment and Plan: 1) acute hepatic encephalopathy in the setting of alcoholic liver cirrhosis--- Received repeated doses of rectal lactulose now becoming a bit more awake =-Patient is not awake enough to take oral lactulose and rifaximin -As per patient's wife patient was previously told that he is not a candidate for transplant -Overall prognosis is poor   2) social/ethics--- plan of care and advanced directive discussed with patient's wife  and patient's family friend Ms Serita Sheller  -Patient's wife requested DNR status -Overall prognosis is poor, -Patient spoke with hospice plan is for possible discharge home with hospice services on 07/25/2021   3) possible aspiration pneumonia--leukocytosis and chest x-ray findings noted -Given altered mentation/encephalopathy high risk for aspiration Continue Rocephin/azithromycin   4) possible UTI--UA suggestive of UTI IV Rocephin as above #3 pending culture data   5)DM2--A1c was 4.3 on 06/09/2021, reflecting excellent diabetic control PTA -Avoid overly aggressive control due to risk for hypoglycemia -Hold scheduled insulin Use Novolog/Humalog Sliding scale insulin with Accu-Cheks/Fingersticks as ordered    6) chronic anemia/chronic  thrombocytopenia--- in the setting of underlying liver cirrhosis -Hgb down to 9.5  platelets down to 73,  INR 2.4 -No evidence of ongoing bleeding, monitor closely   Disposition/Need for in-Hospital Stay- patient unable to be discharged at this time due to -hepatic encephalopathy requiring IV fluids pending improvement in mental status to allow for oral intake -Patient spoke with hospice --plan is for possible discharge home with hospice services on 07/25/2021   Status is: Inpatient   Remains inpatient appropriate because:    Dispo: The patient is from: Home              Anticipated d/c is to: Home with hospice              Anticipated d/c date is: 1 day              Patient currently is not medically stable to d/c. Barriers: Not Clinically Stable-    Code Status :  -  Code Status: DNR   Family Communication: Discussed with patient's wife at bedside  DVT Prophylaxis  :   - SCDs   heparin injection 5,000 Units Start: 07/23/21 1445 SCDs Start: 07/23/21 1345 Place TED hose Start: 07/23/21 1345   Lab Results  Component Value Date   PLT 73 (L) 07/24/2021    Inpatient Medications  Scheduled Meds:  chlorhexidine  15 mL Mouth Rinse BID   folic acid  1 mg Oral Daily   heparin  5,000 Units Subcutaneous Q8H   insulin aspart  0-5 Units Subcutaneous QHS   insulin aspart  0-6 Units Subcutaneous TID WC   lactulose  300 mL Rectal Q6H   mouth rinse  15 mL Mouth Rinse q12n4p   multivitamin with minerals  1  tablet Oral Daily   pantoprazole  40 mg Oral Daily   rifaximin  550 mg Oral BID   sodium chloride flush  3 mL Intravenous Q12H   sodium chloride flush  3 mL Intravenous Q12H   thiamine  100 mg Oral Daily   Continuous Infusions:  sodium chloride     sodium chloride 75 mL/hr at 07/24/21 1513   azithromycin 500 mg (07/24/21 1523)   cefTRIAXone (ROCEPHIN)  IV Stopped (07/24/21 1442)   PRN Meds:.sodium chloride, acetaminophen **OR** acetaminophen, bisacodyl, ondansetron **OR**  ondansetron (ZOFRAN) IV, polyethylene glycol, sodium chloride flush   Anti-infectives (From admission, onward)    Start     Dose/Rate Route Frequency Ordered Stop   07/23/21 1500  cefTRIAXone (ROCEPHIN) 2 g in sodium chloride 0.9 % 100 mL IVPB        2 g 200 mL/hr over 30 Minutes Intravenous Every 24 hours 07/23/21 1346     07/23/21 1500  azithromycin (ZITHROMAX) 500 mg in sodium chloride 0.9 % 250 mL IVPB        500 mg 250 mL/hr over 60 Minutes Intravenous Every 24 hours 07/23/21 1413     07/23/21 1445  rifaximin (XIFAXAN) tablet 550 mg        550 mg Oral 2 times daily 07/23/21 1346     07/23/21 0630  cefTRIAXone (ROCEPHIN) 2 g in sodium chloride 0.9 % 100 mL IVPB        2 g 200 mL/hr over 30 Minutes Intravenous  Once 07/23/21 0624 07/23/21 4193         Subjective: Eric Dennis today has no fevers, no emesis,  No chest pain,    -becoming with more awake after repeated doses of rectal lactulose IV antibiotics and IV fluids - Patient spoke with hospice plan is for possible discharge home with hospice services on 07/25/2021   Objective: Vitals:   07/24/21 0140 07/24/21 0300 07/24/21 0456 07/24/21 1417  BP:   121/66 122/69  Pulse:   (!) 121 (!) 113  Resp: '20 20  20  '$ Temp:   98.3 F (36.8 C) 98.4 F (36.9 C)  TempSrc:   Axillary Oral  SpO2:   96% 97%  Weight:      Height:        Intake/Output Summary (Last 24 hours) at 07/24/2021 1910 Last data filed at 07/24/2021 1513 Gross per 24 hour  Intake 3142.47 ml  Output 1250 ml  Net 1892.47 ml   Filed Weights   07/23/21 0615  Weight: 86 kg    Physical Exam  Gen:-Becoming more awake, HEENT:- [place].AT, significant sclera anicteric Neck-Supple Neck,No JVD,.  Lungs-improving air movement, no wheezing CV- S1, S2 normal, regular  Abd-  +ve B.Sounds, Abd Soft, No tenderness,    Extremity/Skin:- No  edema, pedal pulses present ,very jaundiced NeuroPsych-generalized weakness, memory and cognitive  deficits/encephalopathy  Data Reviewed: I have personally reviewed following labs and imaging studies  CBC: Recent Labs  Lab 07/23/21 0632 07/24/21 0430  WBC 11.7* 10.3  NEUTROABS 9.0*  --   HGB 11.0* 9.5*  HCT 31.1* 27.6*  MCV 122.0* 126.0*  PLT 91* 73*   Basic Metabolic Panel: Recent Labs  Lab 07/23/21 0632 07/24/21 0430  NA 136 141  K 4.5 4.0  CL 97* 106  CO2 30 27  GLUCOSE 132* 111*  BUN 35* 28*  CREATININE 0.70 0.38*  CALCIUM 9.8 9.1   GFR: Estimated Creatinine Clearance: 123.9 mL/min (A) (by C-G formula based on SCr of 0.38 mg/dL (  L)). Liver Function Tests: Recent Labs  Lab 07/23/21 0632 07/24/21 0430  AST 63* 76*  ALT 74* 61*  ALKPHOS 105 87  BILITOT 21.1* 16.4*  PROT 6.6 5.5*  ALBUMIN 2.8* 2.3*   Cardiac Enzymes: No results for input(s): CKTOTAL, CKMB, CKMBINDEX, TROPONINI in the last 168 hours. BNP (last 3 results) No results for input(s): PROBNP in the last 8760 hours. HbA1C: No results for input(s): HGBA1C in the last 72 hours. Sepsis Labs: '@LABRCNTIP'$ (procalcitonin:4,lacticidven:4) ) Recent Results (from the past 240 hour(s))  Urine Culture     Status: None   Collection Time: 07/23/21  6:24 AM   Specimen: Urine, Catheterized  Result Value Ref Range Status   Specimen Description   Final    URINE, CATHETERIZED Performed at [facility], [address], [place], [place] [address]    Special Requests   Final    NONE Performed at [facility], [address], [place], [place] [address]    Culture   Final    NO GROWTH Performed at [facility], [place] [address], [place], [place] [address]    Report Status 07/24/2021 FINAL  Final  Blood Culture (routine x 2)     Status: None (Preliminary result)   Collection Time: 07/23/21  6:32 AM   Specimen: BLOOD RIGHT FOREARM  Result Value Ref Range Status   Specimen Description BLOOD RIGHT FOREARM  Final   Special Requests   Final    BOTTLES DRAWN AEROBIC AND ANAEROBIC Blood Culture adequate  volume   Culture   Final    NO GROWTH 1 DAY Performed at [facility], [address], [place], [place] [address]    Report Status PENDING  Incomplete  Blood Culture (routine x 2)     Status: None (Preliminary result)   Collection Time: 07/23/21  6:41 AM   Specimen: BLOOD RIGHT HAND  Result Value Ref Range Status   Specimen Description BLOOD RIGHT HAND  Final   Special Requests   Final    BOTTLES DRAWN AEROBIC AND ANAEROBIC Blood Culture adequate volume   Culture   Final    NO GROWTH 1 DAY Performed at [facility], [address], [place], [place] [address]    Report Status PENDING  Incomplete  Resp Panel by RT-PCR (Flu A&B, Covid) Nasopharyngeal Swab     Status: None   Collection Time: 07/23/21  6:46 AM   Specimen: Nasopharyngeal Swab; Nasopharyngeal(NP) swabs in vial transport medium  Result Value Ref Range Status   SARS Coronavirus 2 by RT PCR NEGATIVE NEGATIVE Final    Comment: (NOTE) SARS-CoV-2 target nucleic acids are NOT DETECTED.  The SARS-CoV-2 RNA is generally detectable in upper respiratory specimens during the acute phase of infection. The lowest concentration of SARS-CoV-2 viral copies this assay can detect is 138 copies/mL. A negative result does not preclude SARS-Cov-2 infection and should not be used as the sole basis for treatment or other patient management decisions. A negative result may occur with  improper specimen collection/handling, submission of specimen other than nasopharyngeal swab, presence of viral mutation(s) within the areas targeted by this assay, and inadequate number of viral copies(<138 copies/mL). A negative result must be combined with clinical observations, patient history, and epidemiological information. The expected result is Negative.  Fact Sheet for Patients:  EntrepreneurPulse.com.au  Fact Sheet for Healthcare Providers:  IncredibleEmployment.be  This test is no t yet approved or cleared by  the [place] FDA and  has been authorized for detection and/or diagnosis of  SARS-CoV-2 by FDA under an Emergency Use Authorization (EUA). This EUA will remain  in effect (meaning this test can be used) for the duration of the COVID-19 declaration under Section 564(b)(1) of the Act, 21 U.S.C.section 360bbb-3(b)(1), unless the authorization is terminated  or revoked sooner.       Influenza A by PCR NEGATIVE NEGATIVE Final   Influenza B by PCR NEGATIVE NEGATIVE Final    Comment: (NOTE) The Xpert Xpress SARS-CoV-2/FLU/RSV plus assay is intended as an aid in the diagnosis of influenza from Nasopharyngeal swab specimens and should not be used as a sole basis for treatment. Nasal washings and aspirates are unacceptable for Xpert Xpress SARS-CoV-2/FLU/RSV testing.  Fact Sheet for Patients: EntrepreneurPulse.com.au  Fact Sheet for Healthcare Providers: IncredibleEmployment.be  This test is not yet approved or cleared by the [place] FDA and has been authorized for detection and/or diagnosis of SARS-CoV-2 by FDA under an Emergency Use Authorization (EUA). This EUA will remain in effect (meaning this test can be used) for the duration of the COVID-19 declaration under Section 564(b)(1) of the Act, 21 U.S.C. section 360bbb-3(b)(1), unless the authorization is terminated or revoked.  Performed at [facility], [address], [place], [place] [address]       Radiology Studies: CT HEAD WO CONTRAST (5MM)  Result Date: 07/23/2021 CLINICAL DATA:  [place]irium EXAM: CT HEAD WITHOUT CONTRAST TECHNIQUE: Contiguous axial images were obtained from the base of the skull through the vertex without intravenous contrast. RADIATION DOSE REDUCTION: This exam was performed according to the departmental dose-optimization program which includes automated exposure control, adjustment of the mA and/or kV according to patient size and/or use of iterative reconstruction  technique. COMPARISON:  06/08/2021 FINDINGS: Brain: Diffuse parenchymal atrophy. Patchy areas of hypoattenuation in deep and periventricular white matter bilaterally. Negative for acute intracranial hemorrhage, mass lesion, acute infarction, midline shift, or mass-effect. Acute infarct may be inapparent on noncontrast CT. Ventricles and sulci symmetric. Vascular: No hyperdense vessel or unexpected calcification. Skull: Normal. Negative for fracture or focal lesion. Sinuses/Orbits: Retention cyst or polyp in the right maxillary sinus partially visualized. Other: None IMPRESSION: 1. Negative for bleed or other acute intracranial process. 2. Atrophy and nonspecific white matter changes. Electronically Signed   By: Lucrezia Europe M.D.   On: 07/23/2021 07:12   DG Chest Port 1 View  Result Date: 07/23/2021 CLINICAL DATA:  Cough/hypotensive/smoker AMS-pt unable to provide adequate hx EXAM: PORTABLE CHEST - 1 VIEW COMPARISON:  06/08/2021 FINDINGS: Patchy left retrocardiac infiltrate or subsegmental atelectasis slightly increased. Right lung clear. Heart size and mediastinal contours are within normal limits. No effusion. Visualized bones unremarkable. IMPRESSION: Slight increase in left retrocardiac atelectasis or infiltrate Electronically Signed   By: Lucrezia Europe M.D.   On: 07/23/2021 06:45     Scheduled Meds:  chlorhexidine  15 mL Mouth Rinse BID   folic acid  1 mg Oral Daily   heparin  5,000 Units Subcutaneous Q8H   insulin aspart  0-5 Units Subcutaneous QHS   insulin aspart  0-6 Units Subcutaneous TID WC   lactulose  300 mL Rectal Q6H   mouth rinse  15 mL Mouth Rinse q12n4p   multivitamin with minerals  1 tablet Oral Daily   pantoprazole  40 mg Oral Daily   rifaximin  550 mg Oral BID   sodium chloride flush  3 mL Intravenous Q12H   sodium chloride flush  3 mL Intravenous Q12H   thiamine  100 mg Oral Daily   Continuous Infusions:  sodium  chloride     sodium chloride 75 mL/hr at 07/24/21 1513    azithromycin 500 mg (07/24/21 1523)   cefTRIAXone (ROCEPHIN)  IV Stopped (07/24/21 1442)     LOS: 1 day    Roxan Hockey M.D on 07/24/2021 at 7:10 PM  Go to [email or web address] - for contact info  [facility] - Office  [phone]  If 7PM-7AM, please contact night-coverage [email or web address] Password [email or web address] 07/24/2021, 7:10 PM

## 2021-07-24 NOTE — Progress Notes (Addendum)
Gastroenterology Progress Note   Referring Provider: No ref. provider found Primary Care Physician:  Scheryl Marten, [place] Primary Gastroenterologist:  Dr.  Patient ID: Eric Dennis; [id number]; [birth year]    Subjective   Spoke with wife at bedside as patient remains lethargic although some improvement in mental status from yesterday. He was able to softly speak that he was alright but unable to answer any other questions. More eye opening to touch and voice than yesterday. Wife stated the night went okay and that she has noticed some improvement in his mental state than yesterday.   Objective   Vital signs in last 24 hours Temp:  [97.2 F (36.2 C)-98.6 F (37 C)] 98.3 F (36.8 C) (05/18 0456) Pulse Rate:  [103-121] 121 (05/18 0456) Resp:  [16-23] 20 (05/18 0300) BP: (116-130)/(61-76) 121/66 (05/18 0456) SpO2:  [93 %-100 %] 96 % (05/18 0456) Last BM Date : 07/23/21  Physical Exam General:  Drowsy, thin, jaundice Head:  Normocephalic and atraumatic. Eyes:  Scleral icterus Mouth: Dry, poor dentention Heart:  S1, S2 present, no murmurs noted.  Lungs: Clear to auscultation bilaterally, without wheezing, rales, or rhonchi.  Abdomen:  Bowel sounds present, soft, non-tender, non-distended. No HSM or hernias noted. No rebound or guarding. No masses appreciated  Msk:  Symmetrical without gross deformities. Normal posture. Extremities:  Without clubbing or edema. Neurologic: Open eyes to voice and stimulation, UTA orientation, lethargic Skin:  Warm and dry, intact without significant lesions.  Psych:  lethargc, calm.  Intake/Output from previous day: 05/17 0701 - 05/18 0700 In: 2436 [I.V.:883.6; IV Piggyback:352.4] Out: 1250 [Urine:450; Stool:800] Intake/Output this shift: No intake/output data recorded.  Lab Results  Recent Labs    07/23/21 0632 07/24/21 0430  WBC 11.7* 10.3  HGB 11.0* 9.5*  HCT 31.1* 27.6*  PLT 91* 73*   BMET Recent Labs    07/23/21 0632  07/24/21 0430  NA 136 141  K 4.5 4.0  CL 97* 106  CO2 30 27  GLUCOSE 132* 111*  BUN 35* 28*  CREATININE 0.70 0.38*  CALCIUM 9.8 9.1   LFT Recent Labs    07/23/21 0632 07/24/21 0430  PROT 6.6 5.5*  ALBUMIN 2.8* 2.3*  AST 63* 76*  ALT 74* 61*  ALKPHOS 105 87  BILITOT 21.1* 16.4*   PT/INR Recent Labs    07/23/21 0632 07/24/21 0430  LABPROT 24.8* 26.1*  INR 2.3* 2.4*   Hepatitis Panel No results for input(s): HEPBSAG, HCVAB, HEPAIGM, HEPBIGM in the last 72 hours.   Studies/Results CT HEAD WO CONTRAST (5MM)  Result Date: 07/23/2021 CLINICAL DATA:  Delirium EXAM: CT HEAD WITHOUT CONTRAST TECHNIQUE: Contiguous axial images were obtained from the base of the skull through the vertex without intravenous contrast. RADIATION DOSE REDUCTION: This exam was performed according to the departmental dose-optimization program which includes automated exposure control, adjustment of the mA and/or kV according to patient size and/or use of iterative reconstruction technique. COMPARISON:  06/08/2021 FINDINGS: Brain: Diffuse parenchymal atrophy. Patchy areas of hypoattenuation in deep and periventricular white matter bilaterally. Negative for acute intracranial hemorrhage, mass lesion, acute infarction, midline shift, or mass-effect. Acute infarct may be inapparent on noncontrast CT. Ventricles and sulci symmetric. Vascular: No hyperdense vessel or unexpected calcification. Skull: Normal. Negative for fracture or focal lesion. Sinuses/Orbits: Retention cyst or polyp in the right maxillary sinus partially visualized. Other: None IMPRESSION: 1. Negative for bleed or other acute intracranial process. 2. Atrophy and nonspecific white matter changes. Electronically Signed   By: Keturah Barre  Vernard Gambles M.D.   On: 07/23/2021 07:12   DG Chest Port 1 View  Result Date: 07/23/2021 CLINICAL DATA:  Cough/hypotensive/smoker AMS-pt unable to provide adequate hx EXAM: PORTABLE CHEST - 1 VIEW COMPARISON:  06/08/2021  FINDINGS: Patchy left retrocardiac infiltrate or subsegmental atelectasis slightly increased. Right lung clear. Heart size and mediastinal contours are within normal limits. No effusion. Visualized bones unremarkable. IMPRESSION: Slight increase in left retrocardiac atelectasis or infiltrate Electronically Signed   By: Lucrezia Europe M.D.   On: 07/23/2021 06:45    Assessment  56 y.o. male with a history of alcohol abuse, alcoholic cirrhosis with prior esophageal varices s/p banding, severe portal hypertensive gastropathy, non-bleeding gastric and duodenal ulcers on EGD performed 05/13/21 and multiple ED visits and hospitalization for HE. He presented to the ED 5/17 via EMS with AMS and decline over the past week with poor p.o intake. EMS noted decreased responsiveness. GI consulted for hepatic encephalopathy.   Hepatic encephalopathy/decompensated alcoholic cirrhosis: Labs revealed Hgb 11, mild leukocytosis, thrombocytopenia, mildly elvated LFTs with ammonia 42, AST 63, ALT 74, alk phos 105, T.Bili 21.1 with improvement today to ALT 61 Alk phos 87, Tbili 16.4, and mild increase of AST to 76. Hgb decreased to 9.5 likely secondary to IV hydration. Recent hospitalization in April for decompensation alcoholic cirrhosis requiring ICU admission with intubation and subsequently spend a week in inpatient rehab where he requested to go home. History of esophageal varices and gastric and duodenal ulcers on last EGD 05/13/21. While inpatient he was refusing his lactulose but his wife states she had been giving it to him at home 4 times a day however he was becoming more altered and pocketing his meds and foods and spitting them out recently. She reports he was previously more conversant at home and able to recognize his family members however she was providing full ADL care to him at home. It appears that he possibly seen his primary GI on 5/13 however unable to review records. Abdominal exam benign, no edema, unable to assess  for asterixis. Outpatient palliative care had a televisit with his wife last week and were to be coming tomorrow to her home for in person visit. She continues to express that she wants him to be comfortable.  Will continue lactulose enemas q6 hours as he has had some improvement in mentation and will await family decision on goals of care. Continue PPI IV while inpatient.   MELD Na: 27  Patients wife Grafton Folk queried about hospice being the right option and how that would look at home. I discussed my thoughts that his disease has progressed and that if hospice is the direction she and the family feels is the best then at that point we would stop the lactulose and would not continue treatment with medications due to him not being able to take in anything orally and usually rectal tubes are not given on discharge. I discussed that if she wanted to continue giving him lactulose enemas at home that it would mean more frequent changing due to incontinence but that this could be further discussed with palliative. We discussed a little about hospice and I assured her that palliative should be coming by to see her today to further answer her questions and provide her with all the options of home hospice vs hospice home.   Plan / Recommendations  Continue supportive measure Continue lactulose enemas 200g every 6 hours PPI IV Restart Xifaxin if patient able to tolerate p.o. and not transitioning to hospice.  Agree with palliative involvement  Addendum: GI will sign off as patient will be going home with hospice. Please reach out with any questions.     LOS: 1 day    07/24/2021, 9:04 AM   Venetia Night, MSN, FNP-BC, AGACNP-BC [facility]

## 2021-07-24 NOTE — TOC Initial Note (Signed)
Transition of Care [facility]) - Initial/Assessment Note    Patient Details  Name: Eric Dennis MRN: [id number] Date of Birth: 07-06-1965  [facility] CM/SW Contact:    Ihor Gully, LCSW Phone Number: 07/24/2021, 1:30 PM  Clinical Narrative:                 Patient from home with spouse. Active with Palliative Services with [facility]. Discussed Hospice services. Wife chooses to stay with [facility] for Hospice services. Spoke with Anderson Malta with [facility]. Anderson Malta states that [place] will see patient as she is currently at [facility].   Expected Discharge Plan: Home w Hospice Care Barriers to Discharge: Continued Medical Work up   Patient Goals and CMS Choice Patient states their goals for this hospitalization and ongoing recovery are:: home with hospice      Expected Discharge Plan and Services Expected Discharge Plan: [place] arrangements for the past 2 months:  (Palliative Service with [facility])                                      Prior Living Arrangements/Services Living arrangements for the past 2 months:  (Palliative Service with [facility]) Lives with:: Spouse Patient language and need for interpreter reviewed:: Yes        Need for Family Participation in Patient Care: Yes (Comment) Care giver support system in place?: Yes (comment)   Criminal Activity/Legal Involvement Pertinent to Current Situation/Hospitalization: No - Comment as needed  Activities of Daily Living Home Assistive Devices/Equipment: Hospital bed, Wheelchair, Environmental consultant (specify type), Shower chair with back, Bedside commode/3-in-1 ADL Screening (condition at time of admission) Patient's cognitive ability adequate to safely complete daily activities?: No Is the patient deaf or have difficulty hearing?: No Does the patient have difficulty seeing, even when wearing glasses/contacts?: No Does the patient have difficulty concentrating, remembering, or  making decisions?: Yes Patient able to express need for assistance with ADLs?: No Does the patient have difficulty dressing or bathing?: Yes Independently performs ADLs?: No Communication: Independent Dressing (OT): Dependent Is this a change from baseline?: Pre-admission baseline Grooming: Dependent Is this a change from baseline?: Pre-admission baseline Feeding: Dependent Is this a change from baseline?: Pre-admission baseline Bathing: Dependent Is this a change from baseline?: Pre-admission baseline Toileting: Dependent Is this a change from baseline?: Pre-admission baseline In/Out Bed: Dependent Is this a change from baseline?: Pre-admission baseline Walks in Home: Dependent Is this a change from baseline?: Pre-admission baseline Does the patient have difficulty walking or climbing stairs?: Yes Weakness of Legs: Both (visibly weak) Weakness of Arms/Hands: Both  Permission Sought/Granted Permission sought to share information with : Family Supports    Share Information with NAME: Wife, Mrs. Schoffstall           Emotional Assessment     Affect (typically observed): Appropriate   Alcohol / Substance Use: Not Applicable Psych Involvement: No (comment)  Admission diagnosis:  Hepatic encephalopathy ([place]) [K76.82] Altered mental status, unspecified altered mental status type [D63.87] Acute metabolic encephalopathy [F64.33] Patient Active Problem List   Diagnosis Date Noted   Acute metabolic encephalopathy 29/51/8841   Anemia of chronic disease    Leukocytosis    Steroid-induced hyperglycemia    Cirrhosis of liver ([place]) 06/17/2021   Malnutrition of moderate degree 06/13/2021   Decompensated hepatic cirrhosis ([place]) 06/08/2021   Hypokalemia 08/22/2020   Hyponatremia 08/22/2020  AKI (acute kidney injury) ([place]) 46/28/6381   Alcoholic cirrhosis of liver with ascites ([place]) 08/22/2020   Acute blood loss anemia 08/22/2020   Acute hypoxemic respiratory failure ([place]) 08/22/2020    Aspiration pneumonia ([place]) 08/22/2020   Lymphadenopathy 02/16/2019   Orthostatic hypotension 10/08/2018   Thrombocytopenia ([place]) 10/08/2018   Transaminasemia    Syncope 10/07/2018   Idiopathic hypotension    Elevated liver enzymes 01/25/2014   Loss of weight 01/25/2014   TOBACCO ABUSE 08/13/2006   ALLERGIC RHINITIS 08/13/2006   PCP:  Scheryl Marten, PA Pharmacy:   [facility] [address], [place] - [place] [address] [id number] [place] [address] Phone: [phone] Fax: [phone]     Social Determinants of Health (SDOH) Interventions    Readmission Risk Interventions     View : No data to display.

## 2021-07-24 NOTE — Progress Notes (Signed)
     I received a message from the hospitalist Dr. Roxan Hockey stating no need to see this patient.  Agreed for palliative care to sign off at this time.  Please contact us if we can be of further assistance.  Palliative care consult canceled.  Thank you for your referral and allowing PMT to assist in [place] care.   Walden Field, NP Palliative Medicine Team Phone: [phone]  NO CHARGE

## 2021-07-24 NOTE — Progress Notes (Addendum)
Manufacturing engineer [facility]) Hospital Liaison Note  Referral received for patient/family interest in hospice at home. ACC spoke with patient's wife Barrett Shell to confirm interest. Interest confirmed.   Hospice eligibility confirmed.   Plan is to discharge home when medically stable via PTAR.   DME in the home: walker, w/c, BSC, shower chair, and hospital bed through [employer].   No DME needs at this time.   Please send patient home with comfort prescriptions/medications at discharge.   Please call with any questions or concerns. Thank you  Roselee Nova, [place] Hospital Liaison [phone]

## 2021-07-25 ENCOUNTER — Encounter (HOSPITAL_COMMUNITY): Payer: Self-pay | Admitting: Family Medicine

## 2021-07-25 ENCOUNTER — Other Ambulatory Visit: Payer: Medicaid Other | Admitting: Nurse Practitioner

## 2021-07-25 LAB — GLUCOSE, CAPILLARY
Glucose-Capillary: 89 mg/dL (ref 70–99)
Glucose-Capillary: 97 mg/dL (ref 70–99)

## 2021-07-25 MED ORDER — MORPHINE SULFATE (CONCENTRATE) 10 MG /0.5 ML PO SOLN: 10.0000 mg | ORAL | 0 refills | Status: DC | PRN

## 2021-07-25 MED ORDER — OXYCODONE HCL 5 MG/5ML PO SOLN: 5.0000 mg | ORAL | 0 refills | Status: AC | PRN

## 2021-07-25 MED ORDER — LORAZEPAM 0.5 MG PO TABS: 0.5000 mg | ORAL_TABLET | Freq: Three times a day (TID) | ORAL | 0 refills | Status: AC

## 2021-07-25 NOTE — Discharge Summary (Signed)
Eric Dennis, is a 56 y.o. male  DOB 12/24/1965  MRN [id number].  Admission date:  07/23/2021  Admitting Physician  Roxan Hockey, MD  Discharge Date:  07/25/2021   Primary MD  Sabra Heck, [place], [place]  Recommendations for primary care physician for things to follow:  1) discharge home with hospice and full comfort care  Admission Diagnosis  Hepatic encephalopathy ([place]) [K76.82] Altered mental status, unspecified altered mental status type [S97.53] Acute metabolic encephalopathy [Y05.11]   Discharge Diagnosis  Hepatic encephalopathy ([place]) [K76.82] Altered mental status, unspecified altered mental status type [M21.11] Acute metabolic encephalopathy [N35.67]    Principal Problem:   Acute metabolic encephalopathy Active Problems:   Alcoholic cirrhosis of liver with ascites ([place])   Aspiration pneumonia ([place])   Decompensated hepatic cirrhosis ([place])   Anemia of chronic disease      Past Medical History:  Diagnosis Date   Elevated liver function tests    High cholesterol    Weight loss     Past Surgical History:  Procedure Laterality Date   COLONOSCOPY N/A 03/14/2014   Procedure: COLONOSCOPY;  Surgeon: Rogene Houston, MD;  [facility];  Service: Endoscopy;  Laterality: N/A;  730 - moved to 1/6 @ 12:00 - Ann notified pt   COLONOSCOPY WITH PROPOFOL N/A 11/04/2020   Procedure: COLONOSCOPY WITH PROPOFOL;  Surgeon: Ronnette Juniper, MD;  Location: [facility];  Service: Gastroenterology;  Laterality: N/A;   ESOPHAGEAL BANDING  11/04/2020   Procedure: ESOPHAGEAL BANDING;  Surgeon: Ronnette Juniper, MD;  Location: [facility];  Service: Gastroenterology;;   ESOPHAGOGASTRODUODENOSCOPY N/A 05/13/2021   Procedure: ESOPHAGOGASTRODUODENOSCOPY (EGD);  Surgeon: Ronnette Juniper, MD;  Location: [facility];  Service: Gastroenterology;  Laterality: N/A;   ESOPHAGOGASTRODUODENOSCOPY (EGD) WITH PROPOFOL N/A  11/04/2020   Procedure: ESOPHAGOGASTRODUODENOSCOPY (EGD) WITH PROPOFOL;  Surgeon: Ronnette Juniper, MD;  [facility];  Service: Gastroenterology;  Laterality: N/A;   IR PARACENTESIS  07/19/2020   NO PAST SURGERIES     POLYPECTOMY  11/04/2020   Procedure: POLYPECTOMY;  Surgeon: Ronnette Juniper, MD;  Location: [facility];  Service: Gastroenterology;;       HPI  from the history and physical done on the day of admission:    Eric Dennis  is a 56 y.o. male  with history of decompensated alcoholic cirrhosis with prior esophageal varices requiring banding , as well as tobacco abuse, longstanding history of alcohol abuse, chronic anemia and chronic thrombocytopenia in the setting of alcoholic liver cirrhosis, and significant, persistent hyperbilirubinemia who presents to the ED with concerns of unresponsiveness and inability to take in significant oral intake over the last couple days -As per patient's wife  and patient's family friend Ms Serita Sheller who at bedside patient's condition has been declining for at least 1 week No fever  Or chills    No Nausea, Vomiting or Diarrhea Patient is unresponsive, does not follow commands -In the ED -UA suggestive of UTI, total bilirubin 21.1, alk phos 105, ALT 74 and AST 63 -INR is 2.3, -Hgb 11.0,  platelets 91 K -Serum ammonia is 43 CT Head is w/o acute findings Cxr--- possible left-sided pneumonia -Patient received lactulose enema and IV fluids in ED-and IV Rocephin     Hospital Course:   Assessment and Plan: Brief Narrative:  57 y.o. male  with history of decompensated alcoholic cirrhosis with prior esophageal varices requiring banding , as well as tobacco abuse, longstanding history of alcohol abuse, chronic anemia and chronic thrombocytopenia in the setting of alcoholic liver cirrhosis, and significant, persistent hyperbilirubinemia admitted on 07/23/2021 with another bout of hepatic encephalopathy     -Assessment and Plan: 1) acute hepatic  encephalopathy in the setting of alcoholic liver cirrhosis--- Received repeated doses of rectal lactulose now becoming a bit more awake =-Patient is not awake enough to take oral lactulose and rifaximin -As per patient's wife patient was previously told that he is not a candidate for transplant -Overall prognosis is poor -Patient's wife accepted  hospice services and patient is being discharged home with comfort care/hospice services   2) social/ethics--- plan of care and advanced directive discussed with patient's wife  and patient's family friend Ms Serita Sheller  -Patient's wife requested DNR status -Overall prognosis is poor, --Patient's wife accepted to hospice services and patient is being discharged home with comfort care/hospice services   3) possible aspiration pneumonia--leukocytosis and chest x-ray findings noted -Given altered mentation/encephalopathy high risk for aspiration Treated with Rocephin/azithromycin   4) possible UTI--UA suggestive of UTI  Treated with IV Rocephin, urine culture negative no further antibiotics needed   5)DM2--A1c was 4.3 on 06/09/2021, reflecting excellent diabetic control PTA -Discharge home with hospice care and comfort  measures   6) chronic anemia/chronic thrombocytopenia--- in the setting of underlying liver cirrhosis -No evidence of ongoing bleeding    Disposition :-Home with hospice   Dispo: The patient is from: Home              Anticipated d/c is to: Home with hospice                Code Status :  -  Code Status: DNR    Family Communication: Discussed with patient's wife at bedside --Patient's wife accepted to hospice services and patient is being discharged home with comfort care/hospice services  Discharge Condition: Overall prognosis is grave  Follow UP--- hospice team   Consults obtained -hospice  Diet and Activity recommendation:  As advised  Discharge Instructions     Discharge Instructions     Call MD for:   persistant nausea and vomiting   Complete by: As directed    Call MD for:  severe uncontrolled pain   Complete by: As directed    Call MD for:  temperature >100.4   Complete by: As directed    Diet clear liquid   Complete by: As directed    Discharge instructions   Complete by: As directed    1) discharge home with hospice and full comfort care   Discharge wound care:   Complete by: As directed    As above   Increase activity slowly   Complete by: As directed          Discharge Medications     Allergies as of 07/25/2021   No Known Allergies      Medication List     STOP taking these medications    blood glucose meter kit and supplies   folic acid 1 MG tablet Commonly known as: FOLVITE   Gerhardt's butt cream Crea   insulin  aspart 100 UNIT/ML FlexPen Commonly known as: NOVOLOG   insulin glargine 100 UNIT/ML Solostar Pen Commonly known as: LANTUS   multivitamin with minerals Tabs tablet   pantoprazole 40 MG tablet Commonly known as: Protonix   Pen Needles 3/16" 31G X 5 MM Misc   predniSONE 10 MG tablet Commonly known as: DELTASONE   rifaximin 550 MG Tabs tablet Commonly known as: XIFAXAN   spironolactone 50 MG tablet Commonly known as: ALDACTONE   thiamine 100 MG tablet   traZODone 50 MG tablet Commonly known as: DESYREL       TAKE these medications    lactulose 10 GM/15ML solution Commonly known as: CHRONULAC Take 60 mLs (40 g total) by mouth 3 (three) times daily.   LORazepam 0.5 MG tablet Commonly known as: Ativan Take 1 tablet (0.5 mg total) by mouth every 8 (eight) hours.   oxyCODONE 5 MG/5ML solution Commonly known as: ROXICODONE Take 5 mLs (5 mg total) by mouth every 4 (four) hours as needed for severe pain.               Discharge Care Instructions  (From admission, onward)           Start     Ordered   07/25/21 0000  Discharge wound care:       Comments: As above   07/25/21 1324            Major  procedures and Radiology Reports - PLEASE review detailed and final reports for all details, in brief -    CT HEAD WO CONTRAST (5MM)  Result Date: 07/23/2021 CLINICAL DATA:  [place] EXAM: CT HEAD WITHOUT CONTRAST TECHNIQUE: Contiguous axial images were obtained from the base of the skull through the vertex without intravenous contrast. RADIATION DOSE REDUCTION: This exam was performed according to the departmental dose-optimization program which includes automated exposure control, adjustment of the mA and/or kV according to patient size and/or use of iterative reconstruction technique. COMPARISON:  06/08/2021 FINDINGS: Brain: Diffuse parenchymal atrophy. Patchy areas of hypoattenuation in deep and periventricular white matter bilaterally. Negative for acute intracranial hemorrhage, mass lesion, acute infarction, midline shift, or mass-effect. Acute infarct may be inapparent on noncontrast CT. Ventricles and sulci symmetric. Vascular: No hyperdense vessel or unexpected calcification. Skull: Normal. Negative for fracture or focal lesion. Sinuses/Orbits: Retention cyst or polyp in the right maxillary sinus partially visualized. Other: None IMPRESSION: 1. Negative for bleed or other acute intracranial process. 2. Atrophy and nonspecific white matter changes. Electronically Signed   By: Lucrezia Europe M.D.   On: 07/23/2021 07:12   DG Chest Port 1 View  Result Date: 07/23/2021 CLINICAL DATA:  Cough/hypotensive/smoker AMS-pt unable to provide adequate hx EXAM: PORTABLE CHEST - 1 VIEW COMPARISON:  06/08/2021 FINDINGS: Patchy left retrocardiac infiltrate or subsegmental atelectasis slightly increased. Right lung clear. Heart size and mediastinal contours are within normal limits. No effusion. Visualized bones unremarkable. IMPRESSION: Slight increase in left retrocardiac atelectasis or infiltrate Electronically Signed   By: Lucrezia Europe M.D.   On: 07/23/2021 06:45    Micro Results  Recent Results (from the past 240  hour(s))  Urine Culture     Status: None   Collection Time: 07/23/21  6:24 AM   Specimen: Urine, Catheterized  Result Value Ref Range Status   Specimen Description   Final    URINE, CATHETERIZED Performed at [facility], [address], [place], [place] [address]    Special Requests   Final    NONE Performed at [facility], [address], [place], [place] [address]    Culture   Final    NO GROWTH Performed at [facility], [place] [address], [place], [place] [address]    Report Status 07/24/2021 FINAL  Final  Blood Culture (routine x 2)     Status: None (Preliminary result)   Collection Time: 07/23/21  6:32 AM   Specimen: BLOOD RIGHT FOREARM  Result Value Ref Range Status   Specimen Description BLOOD RIGHT FOREARM  Final   Special Requests   Final    BOTTLES DRAWN AEROBIC AND ANAEROBIC Blood Culture adequate volume   Culture   Final    NO GROWTH 2 DAYS Performed at [facility], [address] [place], [place] [address]    Report Status PENDING  Incomplete  Blood Culture (routine x 2)     Status: None (Preliminary result)   Collection Time: 07/23/21  6:41 AM   Specimen: BLOOD RIGHT HAND  Result Value Ref Range Status   Specimen Description BLOOD RIGHT HAND  Final   Special Requests   Final    BOTTLES DRAWN AEROBIC AND ANAEROBIC Blood Culture adequate volume   Culture   Final    NO GROWTH 2 DAYS Performed at [facility], [address], [place], [place] [address]    Report Status PENDING  Incomplete  Resp Panel by RT-PCR (Flu A&B, Covid) Nasopharyngeal Swab     Status: None   Collection Time: 07/23/21  6:46 AM   Specimen: Nasopharyngeal Swab; Nasopharyngeal(NP) swabs in vial transport medium  Result Value Ref Range Status   SARS Coronavirus 2 by RT PCR NEGATIVE NEGATIVE Final    Comment: (NOTE) SARS-CoV-2 target nucleic acids are NOT DETECTED.  The SARS-CoV-2 RNA is generally detectable in upper respiratory specimens during the acute phase of infection. The  lowest concentration of SARS-CoV-2 viral copies this assay can detect is 138 copies/mL. A negative result does not preclude SARS-Cov-2 infection and should not be used as the sole basis for treatment or other patient management decisions. A negative result may occur with  improper specimen collection/handling, submission of specimen other than nasopharyngeal swab, presence of viral mutation(s) within the areas targeted by this assay, and inadequate number of viral copies(<138 copies/mL). A negative result must be combined with clinical observations, patient history, and epidemiological information. The expected result is Negative.  Fact Sheet for Patients:  EntrepreneurPulse.com.au  Fact Sheet for Healthcare Providers:  IncredibleEmployment.be  This test is no t yet approved or cleared by the [place] FDA and  has been authorized for detection and/or diagnosis of SARS-CoV-2 by FDA under an Emergency Use Authorization (EUA). This EUA will remain  in effect (meaning this test can be used) for the duration of the COVID-19 declaration under Section 564(b)(1) of the Act, 21 U.S.C.section 360bbb-3(b)(1), unless the authorization is terminated  or revoked sooner.       Influenza A by PCR NEGATIVE NEGATIVE Final   Influenza B by PCR NEGATIVE NEGATIVE Final    Comment: (NOTE) The Xpert Xpress SARS-CoV-2/FLU/RSV plus assay is intended as an aid in the diagnosis of influenza from Nasopharyngeal swab specimens and should not be used as a sole basis for treatment. Nasal washings and aspirates are unacceptable for Xpert Xpress SARS-CoV-2/FLU/RSV testing.  Fact Sheet for Patients: EntrepreneurPulse.com.au  Fact Sheet for Healthcare Providers: IncredibleEmployment.be  This test is not yet approved or cleared by the [place] FDA and has been authorized for detection and/or diagnosis of SARS-CoV-2 by FDA under  an Emergency Use Authorization (EUA). This EUA will remain in effect (meaning this test can be used) for the duration of the COVID-19 declaration under Section 564(b)(1) of the Act, 21 U.S.C. section 360bbb-3(b)(1), unless the authorization is terminated or revoked.  Performed at [facility], [address], [place], [place] [address]     Today   Subjective    Eric Dennis today has no new complaints -Patient's wife accepted to hospice services and patient is being discharged home with comfort care/hospice services          Patient has been seen and examined prior to discharge   Objective   Blood pressure 124/72, pulse (!) 120, temperature 98.8 F (37.1 C), temperature source Oral, resp. rate 18, height '6\' 4"'  (1.93 m), weight 86 kg, SpO2 97 %.   Intake/Output Summary (Last 24 hours) at 07/25/2021 1441 Last data filed at 07/25/2021 1300 Gross per 24 hour  Intake 3138.15 ml  Output --  Net 3138.15 ml    Exam Gen:-Resting comfortably somewhat sleepy  HEENT:- [place].AT, significant sclera icterus   Neck-Supple Neck,No JVD,.  Lungs-  CTAB , good air movement bilaterally CV- S1, S2 normal, regular Abd-  +ve B.Sounds, Abd Soft, No tenderness,    Extremity/Skin:- No  edema,   good pulses Psych-sleepy,  neuro-mostly sleepy, occasionally will move arms, does not follow commands or instructions   Data Review   CBC w Diff:  Lab Results  Component Value Date   WBC 10.3 07/24/2021   HGB 9.5 (L) 07/24/2021   HCT 27.6 (L) 07/24/2021   PLT 73 (L) 07/24/2021   LYMPHOPCT 6 07/23/2021   MONOPCT 16 07/23/2021   EOSPCT 0 07/23/2021   BASOPCT 0 07/23/2021    CMP:  Lab Results  Component Value Date   NA 141 07/24/2021   K 4.0 07/24/2021   CL 106 07/24/2021   CO2 27 07/24/2021   BUN 28 (H) 07/24/2021   CREATININE 0.38 (L) 07/24/2021   PROT 5.5 (L) 07/24/2021   ALBUMIN 2.3 (L) 07/24/2021   BILITOT 16.4 (H) 07/24/2021   ALKPHOS 87 07/24/2021   AST 76 (H) 07/24/2021   ALT 61  (H) 07/24/2021  .  Total Discharge time is about 33 minutes  Roxan Hockey M.D on 07/25/2021 at 2:41 PM  [place] to [email or web address] -  for contact info  [facility] - Office  [phone]

## 2021-07-25 NOTE — Discharge Instructions (Signed)
1) discharge home with hospice and full comfort care

## 2021-07-25 NOTE — Progress Notes (Signed)
Patient more alert and responsive as shift has progressed.  Vitals have been stable. Wife is asking if patient will be able to eat later today.

## 2021-07-25 NOTE — TOC Transition Note (Signed)
Transition of Care [facility]) - CM/SW Discharge Note   Patient Details  Name: Eric Dennis MRN: [id number] Date of Birth: [birth year]  Transition of [facility] CM/SW Contact:  Salome Arnt, LCSW Phone Number: 07/25/2021, 11:28 AM   Clinical Narrative:  D/C today. Shanita with [facility] notified. Discussed with pt's wife who is agreeable. She requests [facility] transport. LCSW notified RN so floor can arrange when ready. No other TOC needs reported.      Final next level of care: Home w Hospice Care Barriers to Discharge: Barriers Resolved   Patient Goals and CMS Choice Patient states their goals for this hospitalization and ongoing recovery are:: home with hospice      Discharge Placement                  Name of family member notified: pt wife Patient and family notified of of transfer: 07/25/21  Discharge Plan and Services                                     Social Determinants of Health (SDOH) Interventions     Readmission Risk Interventions     View : No data to display.

## 2021-07-28 LAB — CULTURE, BLOOD (ROUTINE X 2)
Culture: NO GROWTH
Culture: NO GROWTH
Special Requests: ADEQUATE
Special Requests: ADEQUATE

## 2021-08-07 DEATH — deceased

## 2021-08-26 IMAGING — DX DG CHEST 1V PORT
2 series · 2 of 2 positions shown · non-contrast
Comparison: 08/22/2020

CLINICAL DATA: Hemoptysis

EXAM:
PORTABLE CHEST 1 VIEW

[chest ap (1 of 2)]
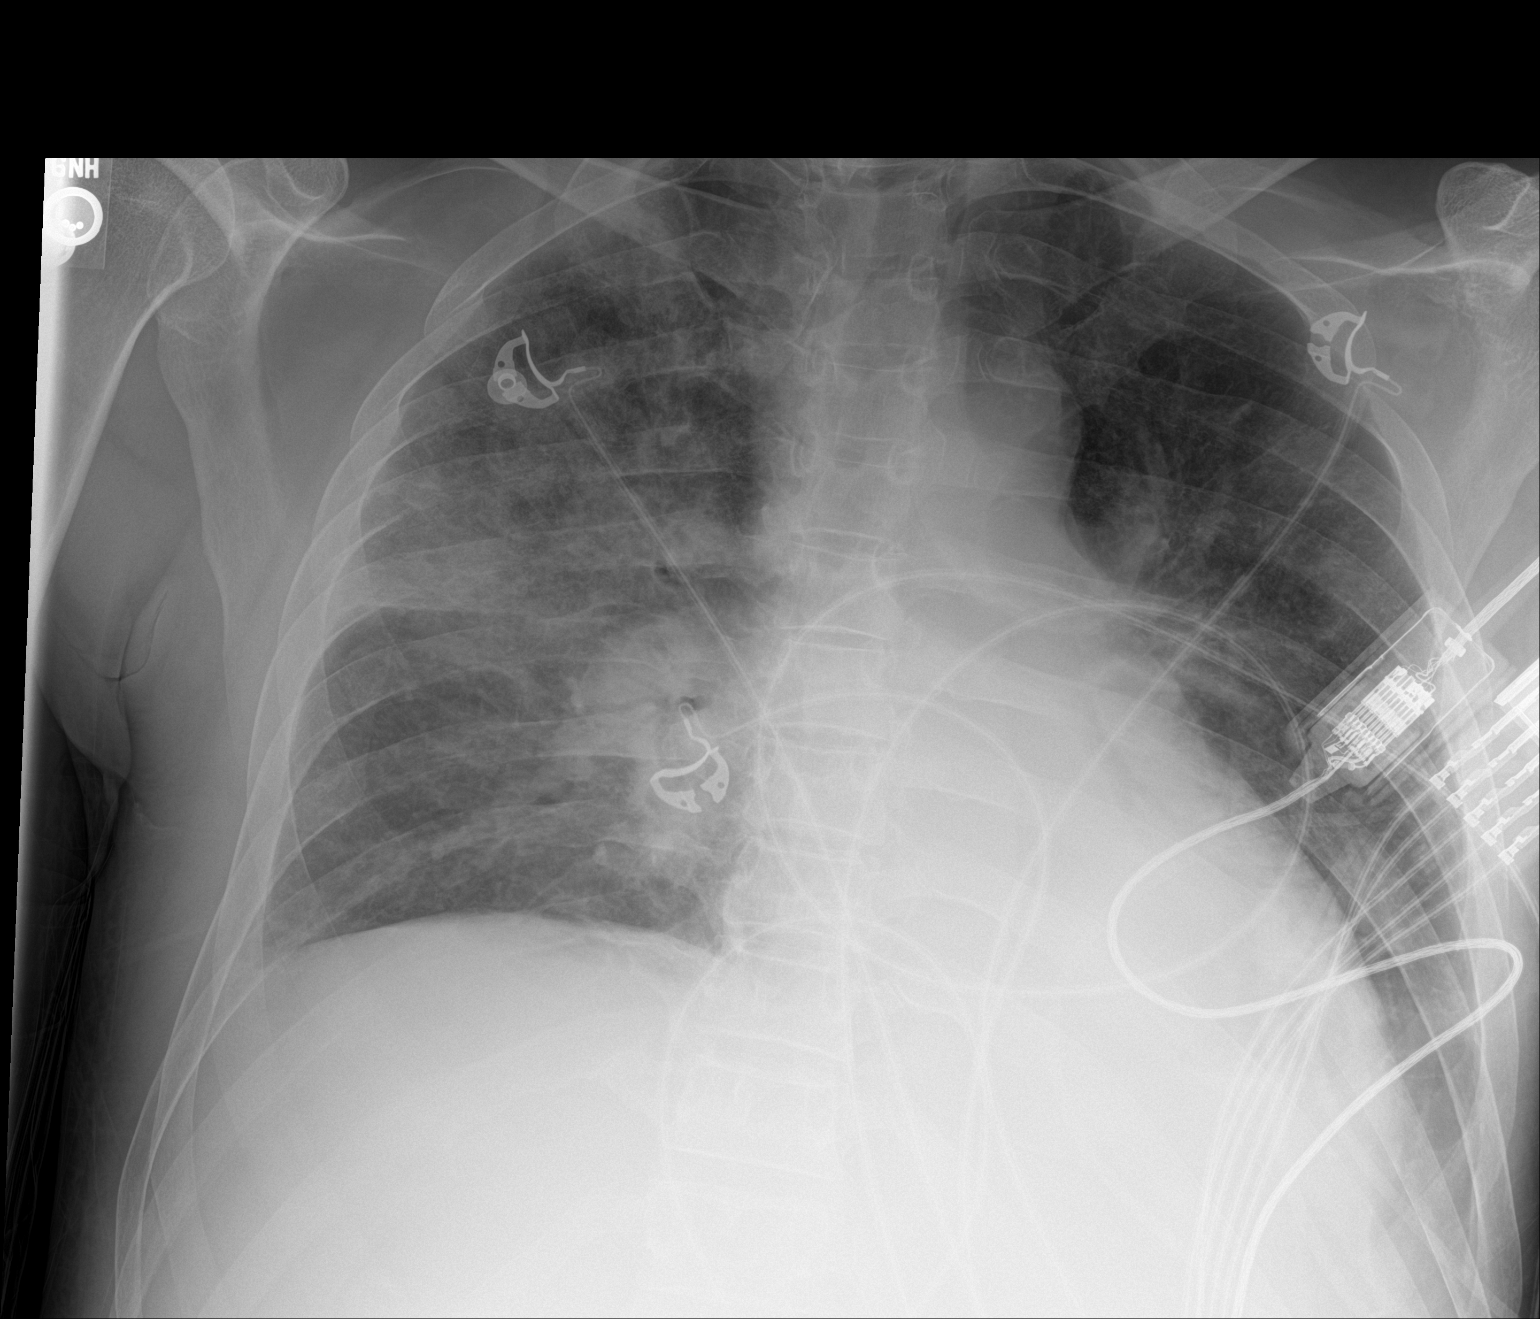

[chest ap (2 of 2)]
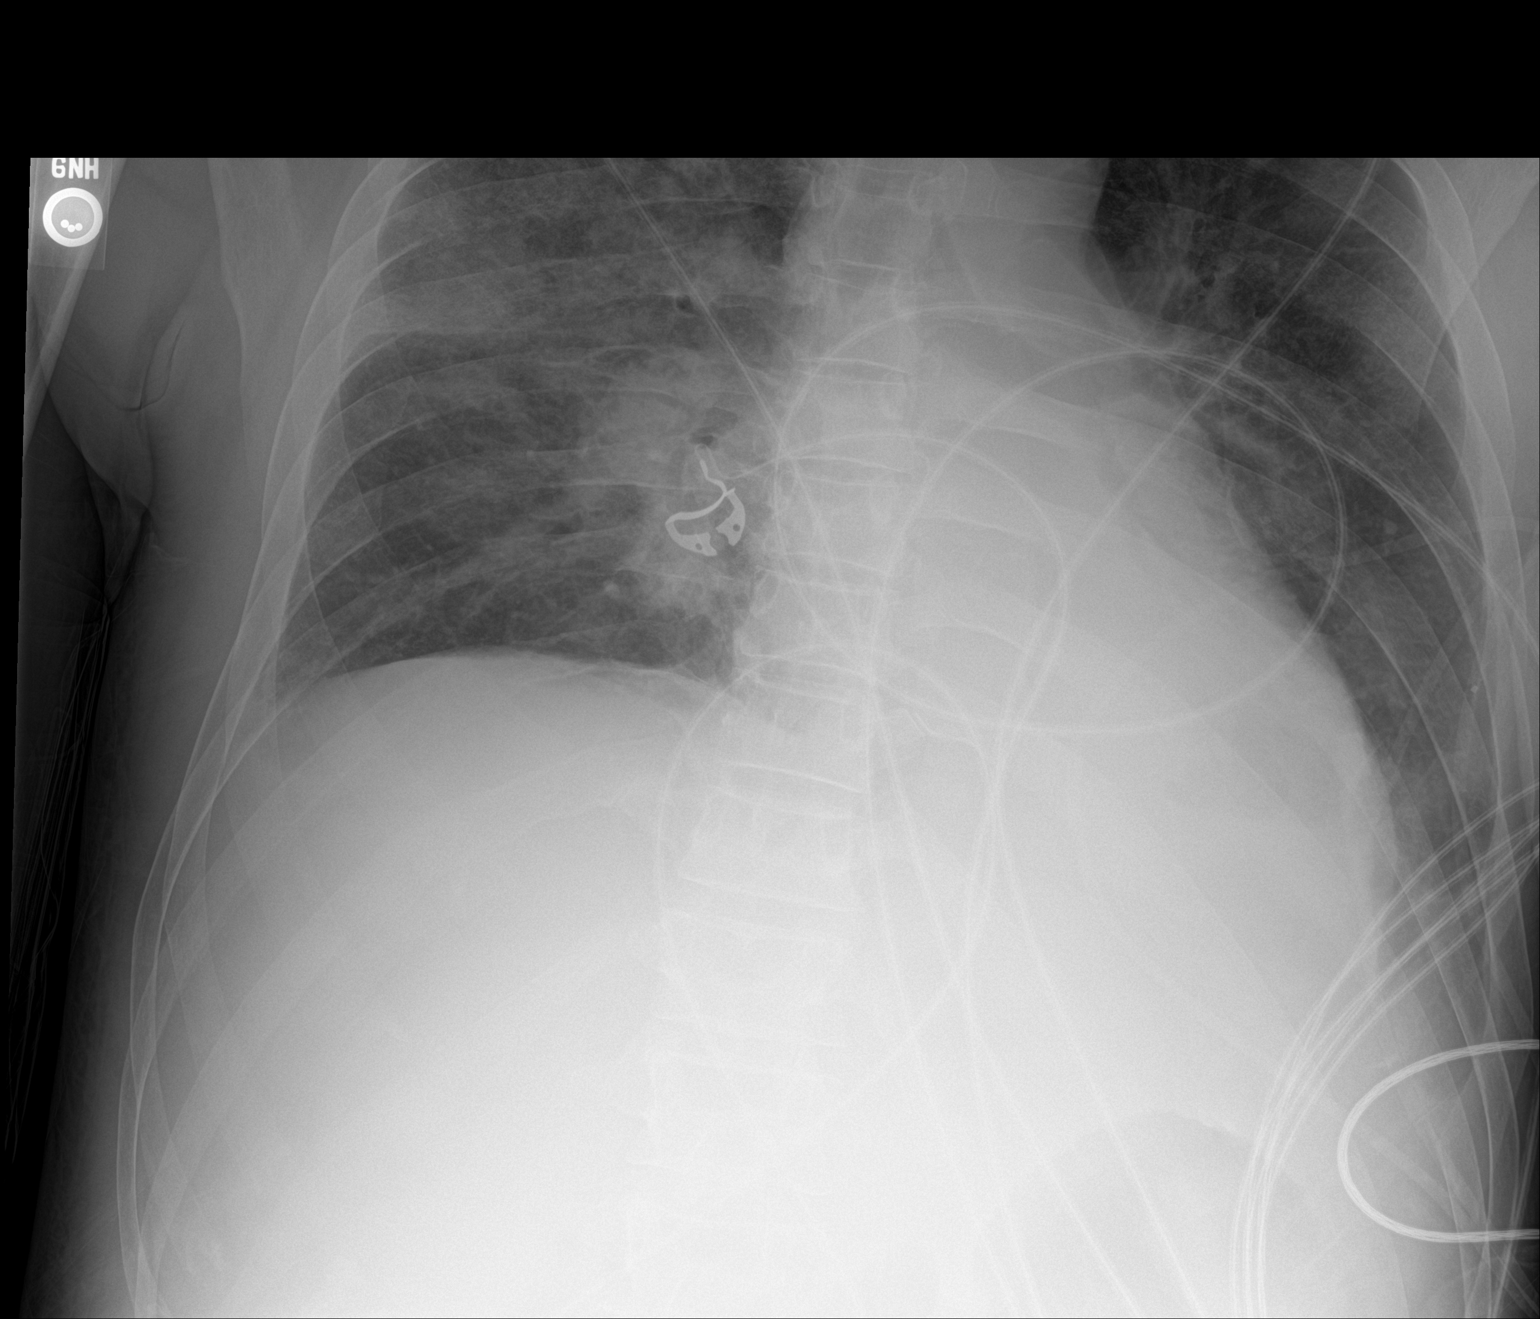

[2 of 2 positions shown; findings below may reference images not displayed]

FINDINGS: Interval development of diffuse bilateral airspace disease.
Progression of left lower lobe consolidation. No significant
effusion.
IMPRESSION: Interval development of diffuse bilateral airspace disease which
could be pneumonia, edema, or blood given history of hemoptysis.
Progressive left lower lobe atelectasis.

## 2021-09-22 ENCOUNTER — Encounter
Payer: Medicaid Other | Attending: Physical Medicine and Rehabilitation | Admitting: Physical Medicine and Rehabilitation

## 2022-06-11 IMAGING — DX DG ABDOMEN 1V
1 series · 1 of 1 positions shown · non-contrast
Comparison: June 08, 2021.

CLINICAL DATA: Orogastric tube placement.

EXAM:
ABDOMEN - 1 VIEW

[abdomen kub]
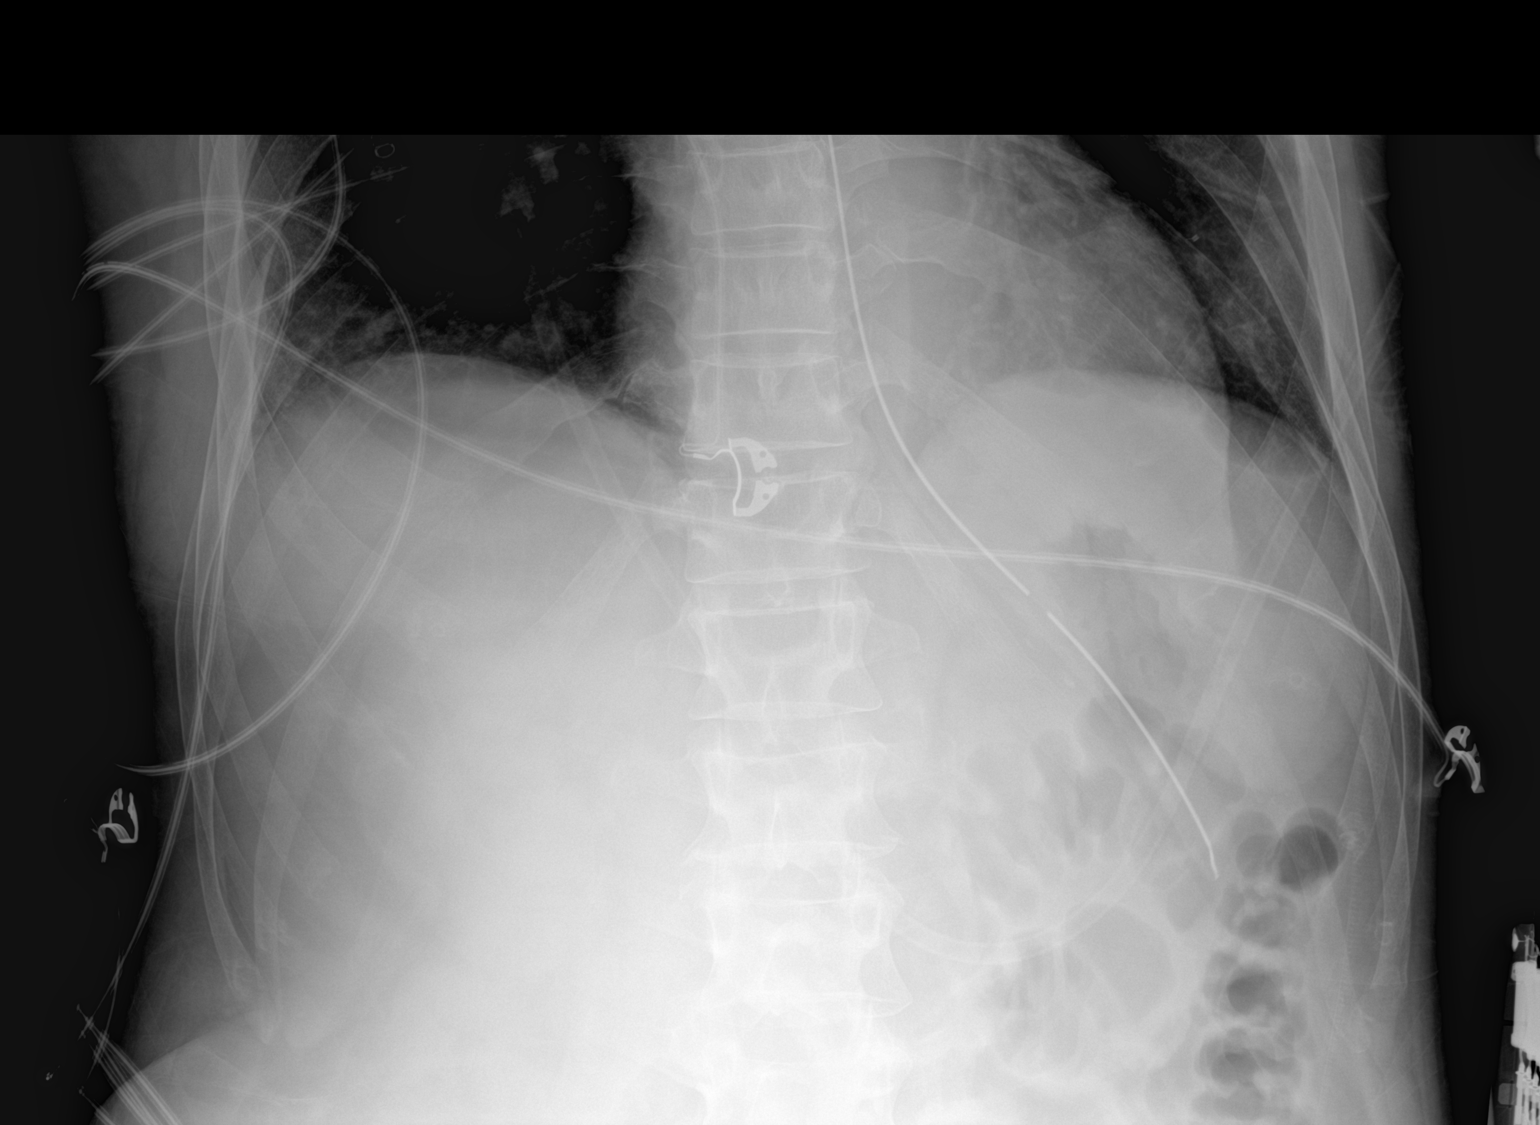

[1 of 1 positions shown; findings below may reference images not displayed]

FINDINGS: Distal tip of nasogastric tube is seen in expected position of
proximal stomach.
IMPRESSION: Distal tip of nasogastric tube seen in expected position of proximal
stomach.

## 2022-06-13 IMAGING — DX DG ABD PORTABLE 1V
1 series · 1 of 1 positions shown · non-contrast
Comparison: 06/09/2021

CLINICAL DATA: Feeding tube placement

EXAM:
PORTABLE ABDOMEN - 1 VIEW

[abdomen]
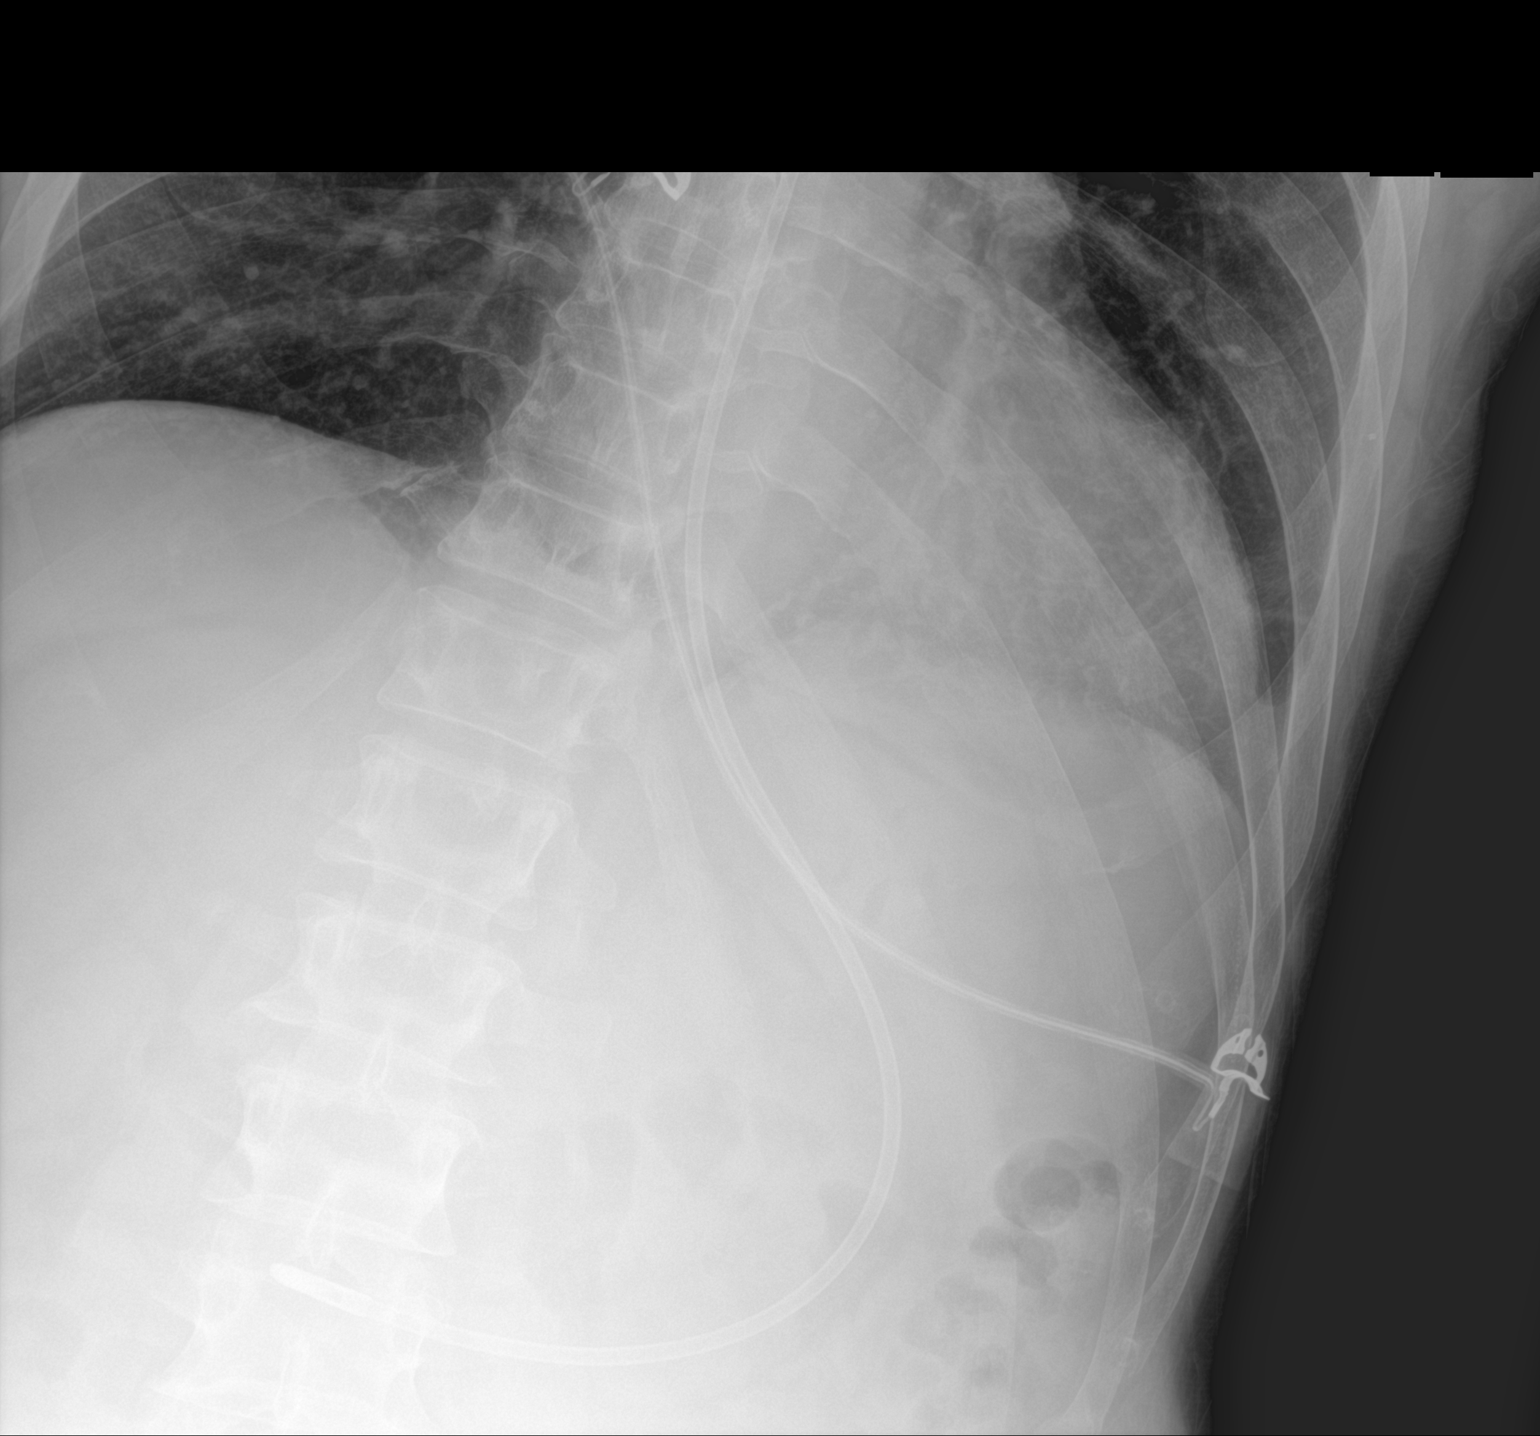

[1 of 1 positions shown; findings below may reference images not displayed]

FINDINGS: The nasogastric tube has been removed and a feeding tube placed.
This terminates over the gastric antrum. No gross free
intraperitoneal air. Right-side of the abdomen and pelvis are
excluded.
IMPRESSION: Feeding tube terminating at the gastric antrum.
